# Patient Record
Sex: Female | Born: 1995 | Race: White | Hispanic: No | Marital: Single
Health system: Southern US, Community
[De-identification: ages and names within clinical notes are randomized; demographics above are authoritative.]

## PROBLEM LIST (undated history)

## (undated) DIAGNOSIS — J45909 Unspecified asthma, uncomplicated: Secondary | ICD-10-CM

## (undated) DIAGNOSIS — Q256 Stenosis of pulmonary artery: Secondary | ICD-10-CM

## (undated) DIAGNOSIS — F909 Attention-deficit hyperactivity disorder, unspecified type: Secondary | ICD-10-CM

## (undated) DIAGNOSIS — M62838 Other muscle spasm: Secondary | ICD-10-CM

## (undated) DIAGNOSIS — F32A Depression, unspecified: Secondary | ICD-10-CM

## (undated) DIAGNOSIS — G4730 Sleep apnea, unspecified: Secondary | ICD-10-CM

## (undated) DIAGNOSIS — T8859XA Other complications of anesthesia, initial encounter: Secondary | ICD-10-CM

## (undated) DIAGNOSIS — R011 Cardiac murmur, unspecified: Secondary | ICD-10-CM

## (undated) DIAGNOSIS — K219 Gastro-esophageal reflux disease without esophagitis: Secondary | ICD-10-CM

## (undated) DIAGNOSIS — L709 Acne, unspecified: Secondary | ICD-10-CM

## (undated) DIAGNOSIS — G44009 Cluster headache syndrome, unspecified, not intractable: Secondary | ICD-10-CM

## (undated) DIAGNOSIS — K828 Other specified diseases of gallbladder: Secondary | ICD-10-CM

## (undated) DIAGNOSIS — M1990 Unspecified osteoarthritis, unspecified site: Secondary | ICD-10-CM

## (undated) DIAGNOSIS — K30 Functional dyspepsia: Secondary | ICD-10-CM

## (undated) DIAGNOSIS — F419 Anxiety disorder, unspecified: Secondary | ICD-10-CM

## (undated) DIAGNOSIS — S0990XA Unspecified injury of head, initial encounter: Secondary | ICD-10-CM

## (undated) DIAGNOSIS — K5909 Other constipation: Secondary | ICD-10-CM

## (undated) DIAGNOSIS — T7840XA Allergy, unspecified, initial encounter: Secondary | ICD-10-CM

## (undated) DIAGNOSIS — F329 Major depressive disorder, single episode, unspecified: Secondary | ICD-10-CM

## (undated) DIAGNOSIS — T4145XA Adverse effect of unspecified anesthetic, initial encounter: Secondary | ICD-10-CM

## (undated) HISTORY — DX: Depression, unspecified: F32.A

## (undated) HISTORY — DX: Attention-deficit hyperactivity disorder, unspecified type: F90.9

## (undated) HISTORY — DX: Allergy, unspecified, initial encounter: T78.40XA

## (undated) HISTORY — DX: Unspecified osteoarthritis, unspecified site: M19.90

## (undated) HISTORY — DX: Anxiety disorder, unspecified: F41.9

## (undated) HISTORY — DX: Sleep apnea, unspecified: G47.30

## (undated) HISTORY — DX: Unspecified asthma, uncomplicated: J45.909

## (undated) HISTORY — DX: Major depressive disorder, single episode, unspecified: F32.9

---
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
## 2013-02-18 ENCOUNTER — Telehealth: Payer: Self-pay | Admitting: Diagnostic Neuroimaging

## 2013-02-18 NOTE — Telephone Encounter (Signed)
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 Mother would like the Dr. Marjory Lies to please call back.

## 2013-02-20 ENCOUNTER — Telehealth: Payer: Self-pay | Admitting: Diagnostic Neuroimaging

## 2013-02-21 NOTE — Telephone Encounter (Signed)
Spoke to patient's mother Clydie Braun. 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 Clydie Braun is unable to speak with patient due to mission, but doctor traveling with patient on called Clydie Braun requesting to increase patients amytriptyline 50 mg to 100mg  for 2 months because he thinks patient is depressed. Clydie Braun would like to speak with Dr. Marjory Lies in reference to the increase because she is afraid it will interfere with patients HA and tolerance level. Advised patient Dr. Marjory Lies not in office. Clydie Braun requesting advice asap. Advised would send to [facility]. Mother agreed.

## 2013-02-21 NOTE — Telephone Encounter (Signed)
Dr. Marjory Lies spoke to mother in relation to medication reccs.

## 2013-07-10 ENCOUNTER — Other Ambulatory Visit: Payer: Self-pay

## 2013-07-10 MED ORDER — RIZATRIPTAN BENZOATE 10 MG PO TABS: 10.0000 mg | ORAL_TABLET | ORAL | Status: DC | PRN

## 2013-07-10 MED ORDER — TIZANIDINE HCL 4 MG PO TABS: 4.0000 mg | ORAL_TABLET | Freq: Two times a day (BID) | ORAL | Status: DC | PRN

## 2013-07-11 ENCOUNTER — Telehealth: Payer: Self-pay | Admitting: Diagnostic Neuroimaging

## 2013-07-11 NOTE — Telephone Encounter (Signed)
Was given her meds in 90days refills for the year and pt is on a mission and now they are switching to [facility] so there will be a request sent for that.

## 2013-07-11 NOTE — Telephone Encounter (Signed)
Rx was already sent to mail order yesterday.  It usually takes them 1-3 business days to process the Rx after it has been sent.

## 2013-08-20 ENCOUNTER — Ambulatory Visit (INDEPENDENT_AMBULATORY_CARE_PROVIDER_SITE_OTHER): Admitting: Diagnostic Neuroimaging

## 2013-08-20 ENCOUNTER — Encounter (INDEPENDENT_AMBULATORY_CARE_PROVIDER_SITE_OTHER): Payer: Self-pay

## 2013-08-20 ENCOUNTER — Encounter: Payer: Self-pay | Admitting: Diagnostic Neuroimaging

## 2013-08-20 VITALS — BP 119/78 | HR 121

## 2013-08-20 DIAGNOSIS — G43809 Other migraine, not intractable, without status migrainosus: Secondary | ICD-10-CM

## 2013-08-20 DIAGNOSIS — M531 Cervicobrachial syndrome: Secondary | ICD-10-CM

## 2013-08-20 DIAGNOSIS — D239 Other benign neoplasm of skin, unspecified: Secondary | ICD-10-CM

## 2013-08-20 DIAGNOSIS — M5481 Occipital neuralgia: Secondary | ICD-10-CM

## 2013-08-20 HISTORY — DX: Other benign neoplasm of skin, unspecified: D23.9

## 2013-08-20 NOTE — Patient Instructions (Signed)
Try physical therapy.  Come back for occipital nerve block injections later this week.
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
## 2013-08-21 ENCOUNTER — Ambulatory Visit (INDEPENDENT_AMBULATORY_CARE_PROVIDER_SITE_OTHER): Admitting: Diagnostic Neuroimaging

## 2013-08-21 VITALS — BP 110/77 | HR 108

## 2013-08-21 DIAGNOSIS — M531 Cervicobrachial syndrome: Secondary | ICD-10-CM

## 2013-08-21 DIAGNOSIS — M5481 Occipital neuralgia: Secondary | ICD-10-CM

## 2013-08-21 NOTE — Progress Notes (Signed)
    Occipital nerve block procedure note  Risks and benefits were reviewed with patient and informed verbal consent obtained. Marcaine 0.5% (lot [id number], expiration 01/27/14) x 64ml plus Depo-Medrol 80 mg/ml (lot [id number], expiration 02/2014) x 17ml were mixed in sterile fashion. 2 mL mixture was injected over 6 total sites (3 on each side) in right and left greater and lesser occipital nerve regions in sterile fashion. Patient tolerated procedure. No complications.  Penni Bombard, MD 2/70/3500, 93:81 AM Certified in Neurology, Neurophysiology and Neuroimaging  [facility] [address], [place], [place] [address] [phone]

## 2013-08-22 ENCOUNTER — Telehealth: Payer: Self-pay | Admitting: Diagnostic Neuroimaging

## 2013-08-22 NOTE — Telephone Encounter (Signed)
Patient calling regarding itching and redness at the injection sites and wants to know if this is normal or not. Please call to advise.

## 2013-08-22 NOTE — Telephone Encounter (Signed)
I called patient's mother. Some redness and itchness at injection site. No fevers or chills. Will try cold compress and hydrocortisone topical.   Penni Bombard, MD 8/45/3646, 8:03 PM Certified in Neurology, Neurophysiology and [facility] [address], [place], [place] [address] [phone]

## 2013-09-28 ENCOUNTER — Encounter (HOSPITAL_COMMUNITY): Admitting: Anesthesiology

## 2013-09-28 ENCOUNTER — Emergency Department (HOSPITAL_COMMUNITY): Admitting: Anesthesiology

## 2013-09-28 ENCOUNTER — Emergency Department (HOSPITAL_COMMUNITY)

## 2013-09-28 ENCOUNTER — Encounter (HOSPITAL_COMMUNITY)
Admission: EM | Disposition: A | Discharge status: Home / Self Care | Payer: Self-pay | Source: Home / Self Care | Attending: Emergency Medicine

## 2013-09-28 ENCOUNTER — Encounter (HOSPITAL_COMMUNITY): Payer: Self-pay | Admitting: Emergency Medicine

## 2013-09-28 ENCOUNTER — Ambulatory Visit (HOSPITAL_COMMUNITY)
Admission: EM | Admit: 2013-09-28 | Discharge: 2013-09-29 | Disposition: A | Discharge status: Home / Self Care | Attending: General Surgery | Admitting: General Surgery

## 2013-09-28 DIAGNOSIS — L708 Other acne: Secondary | ICD-10-CM | POA: Insufficient documentation

## 2013-09-28 DIAGNOSIS — K3580 Unspecified acute appendicitis: Secondary | ICD-10-CM

## 2013-09-28 DIAGNOSIS — R109 Unspecified abdominal pain: Secondary | ICD-10-CM

## 2013-09-28 DIAGNOSIS — R933 Abnormal findings on diagnostic imaging of other parts of digestive tract: Secondary | ICD-10-CM

## 2013-09-28 DIAGNOSIS — R112 Nausea with vomiting, unspecified: Secondary | ICD-10-CM

## 2013-09-28 DIAGNOSIS — K219 Gastro-esophageal reflux disease without esophagitis: Secondary | ICD-10-CM | POA: Insufficient documentation

## 2013-09-28 DIAGNOSIS — G43809 Other migraine, not intractable, without status migrainosus: Secondary | ICD-10-CM | POA: Insufficient documentation

## 2013-09-28 DIAGNOSIS — Q2571 Coarctation of pulmonary artery: Secondary | ICD-10-CM

## 2013-09-28 DIAGNOSIS — K37 Unspecified appendicitis: Secondary | ICD-10-CM

## 2013-09-28 DIAGNOSIS — Q255 Atresia of pulmonary artery: Secondary | ICD-10-CM | POA: Insufficient documentation

## 2013-09-28 HISTORY — PX: LAPAROSCOPIC APPENDECTOMY: SHX408

## 2013-09-28 LAB — COMPREHENSIVE METABOLIC PANEL
ALT: 13 U/L (ref 0–35)
AST: 20 U/L (ref 0–37)
Albumin: 4 g/dL (ref 3.5–5.2)
Alkaline Phosphatase: 86 U/L (ref 39–117)
BILIRUBIN TOTAL: 0.5 mg/dL (ref 0.3–1.2)
BUN: 11 mg/dL (ref 6–23)
CHLORIDE: 103 meq/L (ref 96–112)
CO2: 22 meq/L (ref 19–32)
Calcium: 9.1 mg/dL (ref 8.4–10.5)
Creatinine, Ser: 0.74 mg/dL (ref 0.50–1.10)
GFR calc non Af Amer: >90 mL/min (ref >90)
Glucose, Bld: 90 mg/dL (ref 70–99)
Potassium: 4.7 mEq/L (ref 3.7–5.3)
Sodium: 139 mEq/L (ref 137–147)
Total Protein: 7.4 g/dL (ref 6.0–8.3)

## 2013-09-28 LAB — CBC WITH DIFFERENTIAL/PLATELET
Basophils Absolute: 0 10*3/uL (ref 0.0–0.1)
Basophils Relative: 0 % (ref 0–1)
Eosinophils Absolute: 0.2 10*3/uL (ref 0.0–0.7)
Eosinophils Relative: 3 % (ref 0–5)
HCT: 40.5 % (ref 36.0–46.0)
HEMOGLOBIN: 13.7 g/dL (ref 12.0–15.0)
LYMPHS ABS: 2.9 10*3/uL (ref 0.7–4.0)
LYMPHS PCT: 40 % (ref 12–46)
MCH: 29.7 pg (ref 26.0–34.0)
MCHC: 33.8 g/dL (ref 30.0–36.0)
MCV: 87.7 fL (ref 78.0–100.0)
MONOS PCT: 8 % (ref 3–12)
Monocytes Absolute: 0.6 10*3/uL (ref 0.1–1.0)
Neutro Abs: 3.4 10*3/uL (ref 1.7–7.7)
Neutrophils Relative %: 49 % (ref 43–77)
Platelets: 263 10*3/uL (ref 150–400)
RBC: 4.62 MIL/uL (ref 3.87–5.11)
RDW: 13.2 % (ref 11.5–15.5)
WBC: 7.1 10*3/uL (ref 4.0–10.5)

## 2013-09-28 LAB — URINALYSIS, ROUTINE W REFLEX MICROSCOPIC
BILIRUBIN URINE: NEGATIVE
Glucose, UA: NEGATIVE mg/dL
Hgb urine dipstick: NEGATIVE
KETONES UR: NEGATIVE mg/dL
LEUKOCYTES UA: NEGATIVE
NITRITE: NEGATIVE
PROTEIN: NEGATIVE mg/dL
Specific Gravity, Urine: 1.008 (ref 1.005–1.030)
Urobilinogen, UA: 0.2 mg/dL (ref 0.0–1.0)
pH: 6.5 (ref 5.0–8.0)

## 2013-09-28 LAB — WET PREP, GENITAL
Clue Cells Wet Prep HPF POC: NONE SEEN
Trich, Wet Prep: NONE SEEN
Yeast Wet Prep HPF POC: NONE SEEN

## 2013-09-28 LAB — POC URINE PREG, ED: Preg Test, Ur: NEGATIVE

## 2013-09-28 LAB — LIPASE, BLOOD: Lipase: 33 U/L (ref 11–59)

## 2013-09-28 SURGERY — APPENDECTOMY, LAPAROSCOPIC
Anesthesia: General | Site: Abdomen

## 2013-09-28 MED ORDER — IOHEXOL 300 MG/ML  SOLN
25.0000 mL | Freq: Once | INTRAMUSCULAR | Status: AC | PRN
  Administered 2013-09-28: 25 mL via ORAL

## 2013-09-28 MED ORDER — DEXAMETHASONE SODIUM PHOSPHATE 4 MG/ML IJ SOLN
INTRAMUSCULAR | Status: AC
  Filled 2013-09-28: qty 1

## 2013-09-28 MED ORDER — PHENYLEPHRINE 40 MCG/ML (10ML) SYRINGE FOR IV PUSH (FOR BLOOD PRESSURE SUPPORT)
PREFILLED_SYRINGE | INTRAVENOUS | Status: AC
  Filled 2013-09-28: qty 10

## 2013-09-28 MED ORDER — MORPHINE SULFATE 4 MG/ML IJ SOLN
4.0000 mg | Freq: Once | INTRAMUSCULAR | Status: AC
  Administered 2013-09-28: 4 mg via INTRAVENOUS
  Filled 2013-09-28: qty 1

## 2013-09-28 MED ORDER — SUCCINYLCHOLINE CHLORIDE 20 MG/ML IJ SOLN
INTRAMUSCULAR | Status: AC
  Filled 2013-09-28: qty 1

## 2013-09-28 MED ORDER — KETOROLAC TROMETHAMINE 30 MG/ML IJ SOLN
30.0000 mg | Freq: Four times a day (QID) | INTRAMUSCULAR | Status: DC
  Administered 2013-09-28 – 2013-09-29 (×2): 30 mg via INTRAVENOUS
  Filled 2013-09-28 (×6): qty 1

## 2013-09-28 MED ORDER — ONDANSETRON HCL 4 MG/2ML IJ SOLN: 4.0000 mg | Freq: Once | INTRAMUSCULAR | Status: DC | PRN

## 2013-09-28 MED ORDER — GLYCOPYRROLATE 0.2 MG/ML IJ SOLN
INTRAMUSCULAR | Status: DC | PRN
  Administered 2013-09-28: 0.4 mg via INTRAVENOUS

## 2013-09-28 MED ORDER — ONDANSETRON HCL 4 MG/2ML IJ SOLN
INTRAMUSCULAR | Status: DC | PRN
  Administered 2013-09-28: 4 mg via INTRAVENOUS

## 2013-09-28 MED ORDER — DIPHENHYDRAMINE HCL 25 MG PO CAPS
25.0000 mg | ORAL_CAPSULE | Freq: Four times a day (QID) | ORAL | Status: DC | PRN
  Administered 2013-09-29 (×3): 25 mg via ORAL
  Filled 2013-09-28 (×3): qty 1

## 2013-09-28 MED ORDER — HYDROMORPHONE HCL PF 1 MG/ML IJ SOLN
0.2500 mg | INTRAMUSCULAR | Status: DC | PRN
  Administered 2013-09-28 (×4): 0.5 mg via INTRAVENOUS

## 2013-09-28 MED ORDER — SODIUM CHLORIDE 0.9 % IV BOLUS (SEPSIS)
1000.0000 mL | Freq: Once | INTRAVENOUS | Status: AC
  Administered 2013-09-28: 1000 mL via INTRAVENOUS

## 2013-09-28 MED ORDER — NEOSTIGMINE METHYLSULFATE 10 MG/10ML IV SOLN
INTRAVENOUS | Status: DC | PRN
  Administered 2013-09-28: 3 mg via INTRAVENOUS

## 2013-09-28 MED ORDER — MIDAZOLAM HCL 2 MG/2ML IJ SOLN
2.0000 mg | Freq: Once | INTRAMUSCULAR | Status: AC | PRN
  Administered 2013-09-28: 2 mg via INTRAVENOUS

## 2013-09-28 MED ORDER — KCL IN DEXTROSE-NACL 10-5-0.45 MEQ/L-%-% IV SOLN
INTRAVENOUS | Status: DC
  Administered 2013-09-28: 23:00:00 via INTRAVENOUS
  Filled 2013-09-28 (×3): qty 1000

## 2013-09-28 MED ORDER — ONDANSETRON HCL 4 MG PO TABS: 4.0000 mg | ORAL_TABLET | Freq: Four times a day (QID) | ORAL | Status: DC | PRN

## 2013-09-28 MED ORDER — SUCCINYLCHOLINE CHLORIDE 20 MG/ML IJ SOLN
INTRAMUSCULAR | Status: DC | PRN
  Administered 2013-09-28: 100 mg via INTRAVENOUS

## 2013-09-28 MED ORDER — PROPOFOL 10 MG/ML IV BOLUS
INTRAVENOUS | Status: DC | PRN
  Administered 2013-09-28: 180 mg via INTRAVENOUS

## 2013-09-28 MED ORDER — TIZANIDINE HCL 4 MG PO TABS
4.0000 mg | ORAL_TABLET | Freq: Two times a day (BID) | ORAL | Status: DC | PRN
  Filled 2013-09-28: qty 1

## 2013-09-28 MED ORDER — OXYCODONE-ACETAMINOPHEN 5-325 MG PO TABS
1.0000 | ORAL_TABLET | ORAL | Status: DC | PRN
  Administered 2013-09-29 (×3): 2 via ORAL
  Filled 2013-09-28 (×3): qty 2

## 2013-09-28 MED ORDER — FENTANYL CITRATE 0.05 MG/ML IJ SOLN
INTRAMUSCULAR | Status: AC
  Filled 2013-09-28: qty 5

## 2013-09-28 MED ORDER — MIDAZOLAM HCL 5 MG/5ML IJ SOLN
INTRAMUSCULAR | Status: DC | PRN
  Administered 2013-09-28: 2 mg via INTRAVENOUS

## 2013-09-28 MED ORDER — HYDROMORPHONE HCL PF 1 MG/ML IJ SOLN
INTRAMUSCULAR | Status: AC
  Filled 2013-09-28: qty 1

## 2013-09-28 MED ORDER — SODIUM CHLORIDE 0.9 % IR SOLN
Status: DC | PRN
  Administered 2013-09-28: 1000 mL

## 2013-09-28 MED ORDER — LACTATED RINGERS IV SOLN
INTRAVENOUS | Status: DC | PRN
  Administered 2013-09-28 (×2): via INTRAVENOUS

## 2013-09-28 MED ORDER — ONDANSETRON HCL 4 MG/2ML IJ SOLN: 4.0000 mg | Freq: Four times a day (QID) | INTRAMUSCULAR | Status: DC | PRN

## 2013-09-28 MED ORDER — LIDOCAINE HCL (CARDIAC) 20 MG/ML IV SOLN
INTRAVENOUS | Status: DC | PRN
  Administered 2013-09-28: 50 mg via INTRAVENOUS

## 2013-09-28 MED ORDER — VECURONIUM BROMIDE 10 MG IV SOLR
INTRAVENOUS | Status: AC
  Filled 2013-09-28: qty 10

## 2013-09-28 MED ORDER — DEXAMETHASONE SODIUM PHOSPHATE 4 MG/ML IJ SOLN
INTRAMUSCULAR | Status: DC | PRN
Start: 2013-09-28 — End: 2013-09-28
  Administered 2013-09-28: 4 mg via INTRAVENOUS

## 2013-09-28 MED ORDER — ARTIFICIAL TEARS OP OINT
TOPICAL_OINTMENT | OPHTHALMIC | Status: DC | PRN
  Administered 2013-09-28: 1 via OPHTHALMIC

## 2013-09-28 MED ORDER — SODIUM CHLORIDE 0.9 % IV SOLN
1.0000 g | INTRAVENOUS | Status: AC
  Administered 2013-09-28: 1 g via INTRAVENOUS
  Filled 2013-09-28: qty 1

## 2013-09-28 MED ORDER — MIDAZOLAM HCL 2 MG/2ML IJ SOLN
INTRAMUSCULAR | Status: AC
  Filled 2013-09-28: qty 2

## 2013-09-28 MED ORDER — MELOXICAM 15 MG PO TABS
15.0000 mg | ORAL_TABLET | Freq: Every day | ORAL | Status: DC | PRN
  Filled 2013-09-28: qty 1

## 2013-09-28 MED ORDER — HYDROMORPHONE HCL PF 1 MG/ML IJ SOLN
0.5000 mg | INTRAMUSCULAR | Status: DC | PRN
  Administered 2013-09-29 (×2): 1 mg via INTRAVENOUS
  Filled 2013-09-28 (×2): qty 1

## 2013-09-28 MED ORDER — ONDANSETRON HCL 4 MG/2ML IJ SOLN
4.0000 mg | Freq: Once | INTRAMUSCULAR | Status: AC
  Administered 2013-09-28: 4 mg via INTRAVENOUS
  Filled 2013-09-28: qty 2

## 2013-09-28 MED ORDER — STERILE WATER FOR INJECTION IJ SOLN
INTRAMUSCULAR | Status: AC
  Filled 2013-09-28: qty 10

## 2013-09-28 MED ORDER — FENTANYL CITRATE 0.05 MG/ML IJ SOLN
INTRAMUSCULAR | Status: DC | PRN
  Administered 2013-09-28: 25 ug via INTRAVENOUS
  Administered 2013-09-28: 50 ug via INTRAVENOUS
  Administered 2013-09-28: 25 ug via INTRAVENOUS
  Administered 2013-09-28: 100 ug via INTRAVENOUS
  Administered 2013-09-28: 50 ug via INTRAVENOUS

## 2013-09-28 MED ORDER — KETOROLAC TROMETHAMINE 30 MG/ML IJ SOLN
INTRAMUSCULAR | Status: AC
  Filled 2013-09-28: qty 1

## 2013-09-28 MED ORDER — BUPIVACAINE-EPINEPHRINE (PF) 0.25% -1:200000 IJ SOLN
INTRAMUSCULAR | Status: AC
  Filled 2013-09-28: qty 30

## 2013-09-28 MED ORDER — VECURONIUM BROMIDE 10 MG IV SOLR
INTRAVENOUS | Status: DC | PRN
  Administered 2013-09-28: 4 mg via INTRAVENOUS

## 2013-09-28 MED ORDER — ARTIFICIAL TEARS OP OINT
TOPICAL_OINTMENT | OPHTHALMIC | Status: AC
  Filled 2013-09-28: qty 3.5

## 2013-09-28 MED ORDER — PROPOFOL 10 MG/ML IV BOLUS
INTRAVENOUS | Status: AC
  Filled 2013-09-28: qty 20

## 2013-09-28 MED ORDER — IOHEXOL 300 MG/ML  SOLN
80.0000 mL | Freq: Once | INTRAMUSCULAR | Status: AC | PRN
  Administered 2013-09-28: 80 mL via INTRAVENOUS

## 2013-09-28 MED ORDER — ENOXAPARIN SODIUM 40 MG/0.4ML ~~LOC~~ SOLN
40.0000 mg | SUBCUTANEOUS | Status: DC
  Filled 2013-09-28: qty 0.4

## 2013-09-28 MED ORDER — GLYCOPYRROLATE 0.2 MG/ML IJ SOLN
INTRAMUSCULAR | Status: AC
  Filled 2013-09-28: qty 2

## 2013-09-28 MED ORDER — AMITRIPTYLINE HCL 50 MG PO TABS
50.0000 mg | ORAL_TABLET | Freq: Every day | ORAL | Status: DC
  Administered 2013-09-28: 50 mg via ORAL
  Filled 2013-09-28 (×2): qty 1

## 2013-09-28 MED ORDER — BUPIVACAINE-EPINEPHRINE 0.25% -1:200000 IJ SOLN
INTRAMUSCULAR | Status: DC | PRN
  Administered 2013-09-28: 19 mL

## 2013-09-28 MED ORDER — LIDOCAINE HCL (CARDIAC) 20 MG/ML IV SOLN
INTRAVENOUS | Status: AC
  Filled 2013-09-28: qty 5

## 2013-09-28 MED ORDER — ONDANSETRON HCL 4 MG/2ML IJ SOLN
INTRAMUSCULAR | Status: AC
  Filled 2013-09-28: qty 2

## 2013-09-28 MED ORDER — NEOSTIGMINE METHYLSULFATE 10 MG/10ML IV SOLN
INTRAVENOUS | Status: AC
  Filled 2013-09-28: qty 1

## 2013-09-28 SURGICAL SUPPLY — 43 items
APPLIER CLIP ROT 10 11.4 M/L (STAPLE)
BLADE SURG ROTATE 9660 (MISCELLANEOUS) IMPLANT
CANISTER SUCTION 2500CC (MISCELLANEOUS) ×2 IMPLANT
CHLORAPREP W/TINT 26ML (MISCELLANEOUS) ×2 IMPLANT
CLIP APPLIE ROT 10 11.4 M/L (STAPLE) IMPLANT
COVER SURGICAL LIGHT HANDLE (MISCELLANEOUS) ×2 IMPLANT
CUTTER LINEAR ENDO 35 ETS (STAPLE) ×2 IMPLANT
CUTTER LINEAR ENDO 35 ETS TH (STAPLE) IMPLANT
DECANTER SPIKE VIAL GLASS SM (MISCELLANEOUS) ×2 IMPLANT
DERMABOND ADVANCED (GAUZE/BANDAGES/DRESSINGS) ×1
DERMABOND ADVANCED .7 DNX12 (GAUZE/BANDAGES/DRESSINGS) ×1 IMPLANT
DRAPE UTILITY 15X26 W/TAPE STR (DRAPE) ×4 IMPLANT
DRSG TEGADERM 4X4.75 (GAUZE/BANDAGES/DRESSINGS) ×2 IMPLANT
ELECT REM PT RETURN 9FT ADLT (ELECTROSURGICAL) ×2
ELECTRODE REM PT RTRN 9FT ADLT (ELECTROSURGICAL) ×1 IMPLANT
ENDOLOOP SUT PDS II  0 18 (SUTURE)
ENDOLOOP SUT PDS II 0 18 (SUTURE) IMPLANT
GLOVE BIOGEL PI IND STRL 8 (GLOVE) ×1 IMPLANT
GLOVE BIOGEL PI INDICATOR 8 (GLOVE) ×1
GLOVE ECLIPSE 7.5 STRL STRAW (GLOVE) ×2 IMPLANT
GOWN STRL REUS W/ TWL LRG LVL3 (GOWN DISPOSABLE) ×2 IMPLANT
GOWN STRL REUS W/TWL LRG LVL3 (GOWN DISPOSABLE) ×2
KIT BASIN OR (CUSTOM PROCEDURE TRAY) ×2 IMPLANT
KIT ROOM TURNOVER OR (KITS) ×2 IMPLANT
NS IRRIG 1000ML POUR BTL (IV SOLUTION) ×2 IMPLANT
PAD ARMBOARD 7.5X6 YLW CONV (MISCELLANEOUS) ×4 IMPLANT
PENCIL BUTTON HOLSTER BLD 10FT (ELECTRODE) ×2 IMPLANT
POUCH SPECIMEN RETRIEVAL 10MM (ENDOMECHANICALS) ×2 IMPLANT
RELOAD /EVU35 (ENDOMECHANICALS) IMPLANT
RELOAD CUTTER ETS 35MM STAND (ENDOMECHANICALS) IMPLANT
SCALPEL HARMONIC ACE (MISCELLANEOUS) ×2 IMPLANT
SET IRRIG TUBING LAPAROSCOPIC (IRRIGATION / IRRIGATOR) ×2 IMPLANT
SPECIMEN JAR SMALL (MISCELLANEOUS) ×2 IMPLANT
STRIP CLOSURE SKIN 1/2X4 (GAUZE/BANDAGES/DRESSINGS) ×2 IMPLANT
SUT MNCRL AB 4-0 PS2 18 (SUTURE) ×2 IMPLANT
TOWEL OR 17X24 6PK STRL BLUE (TOWEL DISPOSABLE) ×2 IMPLANT
TOWEL OR 17X26 10 PK STRL BLUE (TOWEL DISPOSABLE) ×2 IMPLANT
TRAY FOLEY CATH 16FR SILVER (SET/KITS/TRAYS/PACK) ×2 IMPLANT
TRAY LAPAROSCOPIC (CUSTOM PROCEDURE TRAY) ×2 IMPLANT
TROCAR XCEL 12X100 BLDLESS (ENDOMECHANICALS) ×2 IMPLANT
TROCAR XCEL BLUNT TIP 100MML (ENDOMECHANICALS) ×2 IMPLANT
TROCAR XCEL NON-BLD 5MMX100MML (ENDOMECHANICALS) ×2 IMPLANT
WATER STERILE IRR 1000ML POUR (IV SOLUTION) IMPLANT

## 2013-09-28 NOTE — ED Notes (Signed)
Patient returned from CT

## 2013-09-28 NOTE — ED Provider Notes (Signed)
18 y.o. Female with abdominal pain began last night.  Describes as upper abdomen and sharp in nature.  She has not eaten today and has one episode of vomiting. PE Abdomen soft ttp ruq and rlq.  CT- equivocal for appendicitis.  Discussed with Dr. Hulen Skains and he has seen and evaluated patient.   Shaune Pollack, MD 09/28/13 2022

## 2013-09-28 NOTE — ED Notes (Signed)
Pelvic cart set up in room 

## 2013-09-28 NOTE — ED Notes (Signed)
Transported to Short Stay at this time

## 2013-09-28 NOTE — ED Notes (Addendum)
Shes had abd pain and nausea since yesterday. She was seen by GYN for a similar pain several weeks ago but they did not find anything wrong

## 2013-09-28 NOTE — H&P (Signed)
Angela [name] is an 18 y.o. female.   Chief Complaint: Abdominal pain HPI: Patient started getting sick yesterday evening with right sided abdominal pain associated with anxiety, nausea and vomiting.  Poor appetite.  CT in the ED suggests possible early acute appendicitis.  Clinical exam is equivocal.  The patient is due to go on a 6 month mission in a few weeks.  Past Medical History  Diagnosis Date  . Complication of anesthesia 05/2010    ketamine reactiion - combative, hallucinations  . Headache(784.0)   . Pulmonary artery stenosis   . Acne     Past Surgical History  Procedure Laterality Date  . Hymenectomy  2006  . Tonsillectomy  1999    and adenoids  . Cosmetic surgery  2009    septoplasty    Family History  Problem Relation Age of Onset  . Colon cancer Maternal Grandmother   . Other Maternal Grandfather     pituitary tumor  . Diabetes Paternal Grandmother   . Heart Problems Paternal Grandfather     A fib, CHF   Social History:  reports that she has never smoked. She has never used smokeless tobacco. She reports that she does not drink alcohol or use illicit drugs.  Allergies:  Allergies  Allergen Reactions  . Augmentin [Amoxicillin-Pot Clavulanate] Nausea And Vomiting    Severe vomiting x 6-8 hours and has gotten to the point of throwing up blood  . Compazine [Prochlorperazine Edisylate]     Restless, agitation  . Ketamine Other (See Comments)    Pt had hallucinations and became very violent in an oral surgery  . Topamax [Topiramate] Hives  . Latex Rash    Specifically latex band-aids     (Not in a hospital admission)  Results for orders placed during the hospital encounter of 09/28/13 (from the past 48 hour(s))  CBC WITH DIFFERENTIAL     Status: None   Collection Time    09/28/13 11:34 AM      Result Value Ref Range   WBC 7.1  4.0 - 10.5 K/uL   RBC 4.62  3.87 - 5.11 MIL/uL   Hemoglobin 13.7  12.0 - 15.0 g/dL   HCT 40.5  36.0 - 46.0 %   MCV 87.7  78.0  - 100.0 fL   MCH 29.7  26.0 - 34.0 pg   MCHC 33.8  30.0 - 36.0 g/dL   RDW 13.2  11.5 - 15.5 %   Platelets 263  150 - 400 K/uL   Neutrophils Relative % 49  43 - 77 %   Neutro Abs 3.4  1.7 - 7.7 K/uL   Lymphocytes Relative 40  12 - 46 %   Lymphs Abs 2.9  0.7 - 4.0 K/uL   Monocytes Relative 8  3 - 12 %   Monocytes Absolute 0.6  0.1 - 1.0 K/uL   Eosinophils Relative 3  0 - 5 %   Eosinophils Absolute 0.2  0.0 - 0.7 K/uL   Basophils Relative 0  0 - 1 %   Basophils Absolute 0.0  0.0 - 0.1 K/uL  COMPREHENSIVE METABOLIC PANEL     Status: None   Collection Time    09/28/13 11:34 AM      Result Value Ref Range   Sodium 139  137 - 147 mEq/L   Potassium 4.7  3.7 - 5.3 mEq/L   Chloride 103  96 - 112 mEq/L   CO2 22  19 - 32 mEq/L   Glucose, Bld 90  70 - 99 mg/dL   BUN 11  6 - 23 mg/dL   Creatinine, Ser 0.74  0.50 - 1.10 mg/dL   Calcium 9.1  8.4 - 10.5 mg/dL   Total Protein 7.4  6.0 - 8.3 g/dL   Albumin 4.0  3.5 - 5.2 g/dL   AST 20  0 - 37 U/L   ALT 13  0 - 35 U/L   Alkaline Phosphatase 86  39 - 117 U/L   Total Bilirubin 0.5  0.3 - 1.2 mg/dL   GFR calc non Af Amer >90  >90 mL/min   GFR calc Af Amer >90  >90 mL/min   Comment: (NOTE)     The eGFR has been calculated using the CKD EPI equation.     This calculation has not been validated in all clinical situations.     eGFR's persistently <90 mL/min signify possible Chronic Kidney     Disease.  LIPASE, BLOOD     Status: None   Collection Time    09/28/13 11:56 AM      Result Value Ref Range   Lipase 33  11 - 59 U/L  URINALYSIS, ROUTINE W REFLEX MICROSCOPIC     Status: None   Collection Time    09/28/13 12:03 PM      Result Value Ref Range   Color, Urine YELLOW  YELLOW   APPearance CLEAR  CLEAR   Specific Gravity, Urine 1.008  1.005 - 1.030   pH 6.5  5.0 - 8.0   Glucose, UA NEGATIVE  NEGATIVE mg/dL   Hgb urine dipstick NEGATIVE  NEGATIVE   Bilirubin Urine NEGATIVE  NEGATIVE   Ketones, ur NEGATIVE  NEGATIVE mg/dL   Protein, ur  NEGATIVE  NEGATIVE mg/dL   Urobilinogen, UA 0.2  0.0 - 1.0 mg/dL   Nitrite NEGATIVE  NEGATIVE   Leukocytes, UA NEGATIVE  NEGATIVE   Comment: MICROSCOPIC NOT DONE ON URINES WITH NEGATIVE PROTEIN, BLOOD, LEUKOCYTES, NITRITE, OR GLUCOSE <1000 mg/dL.  POC URINE PREG, ED     Status: None   Collection Time    09/28/13 12:09 PM      Result Value Ref Range   Preg Test, Ur NEGATIVE  NEGATIVE   Comment:            THE SENSITIVITY OF THIS     METHODOLOGY IS >24 mIU/mL  WET PREP, GENITAL     Status: Abnormal   Collection Time    09/28/13  1:16 PM      Result Value Ref Range   Yeast Wet Prep HPF POC NONE SEEN  NONE SEEN   Trich, Wet Prep NONE SEEN  NONE SEEN   Clue Cells Wet Prep HPF POC NONE SEEN  NONE SEEN   WBC, Wet Prep HPF POC TOO NUMEROUS TO COUNT (*) NONE SEEN   Ct Abdomen Pelvis W Contrast  09/28/2013   CLINICAL DATA:  Right lower quadrant pain.  Nausea and vomiting.  EXAM: CT ABDOMEN AND PELVIS WITH CONTRAST  TECHNIQUE: Multidetector CT imaging of the abdomen and pelvis was performed using the standard protocol following bolus administration of intravenous contrast.  CONTRAST:  66m OMNIPAQUE IOHEXOL 300 MG/ML  SOLN  COMPARISON:  None.  FINDINGS: The appendix curls all will need posterior tip of the cecum. It measures 6 mm in greatest diameter. It contains air. There is no appendiceal wall thickening. Appears, however, mild adjacent hazy inflammatory change.  And moderate increased stool in the colon. No colonic wall thickening are other areas of  inflammation. The small bowel is unremarkable.  The uterus and is unremarkable. Left ovary is mildly prominent, likely from a dominant follicular cyst.  Clear lung bases.  The heart is normal size.  Normal liver, spleen, gallbladder and pancreas. No bile duct dilation. Normal adrenal glands. Normal kidneys. Normal ureters. Normal bladder. No pathologically enlarged lymph nodes are appreciated.  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 No other bony abnormality.  IMPRESSION: 1. Appendix is at the upper limits of normal in diameter. There is no wall thickening and air is seen in the appendix. However, there is periappendiceal inflammatory type change. The findings are equivocal. They could reflect early appendicitis. The apparent inflammatory change may alternatively reflect a small amount of inferior pericolic gutter nonspecific fluid. Consider a short-term followup CT to re-evaluate the appendix, with repeat CT imaging in 12 the 24 hours. 2. Moderate increased stool in the colon. 3. 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 4. No other abnormalities.   Electronically Signed   By: Lajean Manes M.D.   On: 09/28/2013 16:28    Review of Systems  Constitutional: Negative for fever and chills.  HENT: Negative.   Eyes: Negative.   Respiratory: Negative.   Cardiovascular: Negative.   Gastrointestinal: Positive for nausea, vomiting and abdominal pain.       Poor appetite  Genitourinary: Negative.   Musculoskeletal: Negative.   Skin: Negative.   Neurological: Negative.   Endo/Heme/Allergies: Negative.   Psychiatric/Behavioral: Negative.     Blood pressure 114/67, pulse 80, temperature 98.4 F (36.9 C), temperature source Oral, resp. rate 18, SpO2 99.00%. Physical Exam  Constitutional: She is oriented to person, place, and time. She appears well-developed and well-nourished.  HENT:  Head: Normocephalic and atraumatic.  Eyes: Conjunctivae and EOM are normal. Pupils are equal, round, and reactive to light.  Neck: Normal range of motion. Neck supple.  Cardiovascular: Normal rate, regular rhythm and normal heart sounds.   Respiratory: Effort normal and breath sounds normal.  GI: Soft. Normal appearance and bowel sounds are normal. She exhibits no distension. There is tenderness in the right lower quadrant. There is no rigidity and no guarding.    Musculoskeletal: Normal range of motion.       Feet:  Neurological: She is  alert and oriented to person, place, and time. She has normal reflexes.  Skin: Skin is warm and dry.  Psychiatric: She has a normal mood and affect. Her behavior is normal. Judgment and thought content normal.     Assessment/Plan Abdominal pain Possible early acute appendicitis, but equivocal because  There is air in the appendix, and minimal stranding   I have offered the patient lap appy with 60/40 chance this could be acute appendicitis.  They have decided to go ahead with surgery.  Gwenyth Ober 09/28/2013, 5:51 PM

## 2013-09-28 NOTE — ED Provider Notes (Signed)
CSN: [id number]     Arrival date & time 09/28/13  1128 History   First MD Initiated Contact with Patient 09/28/13 1156     Chief Complaint  Patient presents with  . Abdominal Pain     (Consider location/radiation/quality/duration/timing/severity/associated sxs/prior Treatment) HPI  18 year old female with history of migraine, pulmonary artery stenosis, acne who presents complaining of abdominal pain. Patient report yesterday afternoon while sitting is low an acute onset of sharp stabbing and hurting pain to the low abdomen. Pain wraps around her abdomen and back, persistent, 8/10, worsening with movement, and associate nausea. She tries taking, with minimal relief. She is afraid to eat and drink. No associated fever, chills, headache, chest pain, shortness of breath, productive cough, dysuria, hematuria, hematochezia or melena. No rash. Patient has one similar episode 3 weeks ago but not as intense. She was seen at her OB/GYN had an ultrasound performed that shows no acute finding. She reported not being sexually active. Her last menstrual period was 2 weeks ago.  Past Medical History  Diagnosis Date  . Complication of anesthesia 05/2010    ketamine reactiion - combative, hallucinations  . Headache(784.0)   . Pulmonary artery stenosis   . Acne    Past Surgical History  Procedure Laterality Date  . Hymenectomy  2006  . Tonsillectomy  1999    and adenoids  . Cosmetic surgery  2009    septoplasty   Family History  Problem Relation Age of Onset  . Colon cancer Maternal Grandmother   . Other Maternal Grandfather     pituitary tumor  . Diabetes Paternal Grandmother   . Heart Problems Paternal Grandfather     A fib, CHF   History  Substance Use Topics  . Smoking status: Never Smoker   . Smokeless tobacco: Never Used  . Alcohol Use: No   OB History   Grav Para Term Preterm Abortions TAB SAB Ect Mult Living                 Review of Systems  All other systems reviewed and are  negative.     Allergies  Augmentin; Compazine; Ketamine; and Topamax  Home Medications   Prior to Admission medications   Medication Sig Start Date End Date Taking? Authorizing Provider  amitriptyline (ELAVIL) 50 MG tablet Take 100 mg by mouth at bedtime. 11/01/12   Penni Bombard, MD  cetirizine (ZYRTEC) 10 MG tablet Take 10 mg by mouth at bedtime.     Historical Provider, MD  ibuprofen (ADVIL,MOTRIN) 200 MG tablet Take 400 mg by mouth every 6 (six) hours as needed for pain.    Historical Provider, MD  metoprolol tartrate (LOPRESSOR) 25 MG tablet Take 25 mg by mouth. Titrating; currently at 6mg     Historical Provider, MD  rizatriptan (MAXALT) 10 MG tablet Take 1 tablet (10 mg total) by mouth as needed for migraine. May repeat in 2 hours if needed 07/10/13   Penni Bombard, MD  tiZANidine (ZANAFLEX) 4 MG tablet Take 1 tablet (4 mg total) by mouth 2 (two) times daily as needed. 07/10/13   Penni Bombard, MD   BP 109/72  Pulse 104  Temp(Src) 98.4 F (36.9 C) (Oral)  Resp 18  SpO2 99% Physical Exam  Nursing note and vitals reviewed. Constitutional: She appears well-developed and well-nourished. No distress.  HENT:  Head: Normocephalic and atraumatic.  Eyes: Conjunctivae are normal.  Neck: Normal range of motion. Neck supple.  Cardiovascular: Normal rate and regular rhythm.  Pulmonary/Chest: Effort normal and breath sounds normal. She exhibits no tenderness.  Abdominal: Soft. Bowel sounds are normal. She exhibits no distension and no mass. There is tenderness (Diffuse abdominal tenderness without focal point tenderness. No Murphy sign, no McBurney's point.). There is no rebound and no guarding.  Genitourinary: Vagina normal and uterus normal. There is no rash or lesion on the right labia. There is no rash or lesion on the left labia. Cervix exhibits no motion tenderness and no discharge. Right adnexum displays no mass and no tenderness. Left adnexum displays no mass and no  tenderness. No erythema, tenderness or bleeding around the vagina. No vaginal discharge found.  Chaperone present:  Lymphadenopathy:       Right: No inguinal adenopathy present.       Left: No inguinal adenopathy present.    ED Course  Procedures (including critical care time)  12:32 PM Patient with low abnormal pain, appears uncomfortable but without peritoneal sign. No significant discomfort on pelvic examination. Her pregnancy test is negative, normal WBC and normal H&H, with normal comprehensive metabolic panel. However patient and her mom is concern of abdominal pain. Given that early appendicitis can manifest without significant change in ones labs value, will obtain abd/pelvis CT scan for further evaluation.  Pt and mother agrees with plan.    6:11 PM Abdominal and pelvis CT scan demonstrated an appendix that is at the upper limit of normal in diameter. There is also moderate increased stool burden. Given this finding, it is suspected this could be early appendicitis. With consult and general surgery, Dr. Hulen Skains who has seen and evaluate patient and plan on admitting the patient for observation vs. elective surgery for appendectomy.  Labs Review Labs Reviewed  WET PREP, GENITAL - Abnormal; Notable for the following:    WBC, Wet Prep HPF POC TOO NUMEROUS TO COUNT (*)    All other components within normal limits  GC/CHLAMYDIA PROBE AMP  CBC WITH DIFFERENTIAL  COMPREHENSIVE METABOLIC PANEL  URINALYSIS, ROUTINE W REFLEX MICROSCOPIC  LIPASE, BLOOD  POC URINE PREG, ED    Imaging Review Ct Abdomen Pelvis W Contrast  09/28/2013   CLINICAL DATA:  Right lower quadrant pain.  Nausea and vomiting.  EXAM: CT ABDOMEN AND PELVIS WITH CONTRAST  TECHNIQUE: Multidetector CT imaging of the abdomen and pelvis was performed using the standard protocol following bolus administration of intravenous contrast.  CONTRAST:  25mL OMNIPAQUE IOHEXOL 300 MG/ML  SOLN  COMPARISON:  None.  FINDINGS: The appendix  curls all will need posterior tip of the cecum. It measures 6 mm in greatest diameter. It contains air. There is no appendiceal wall thickening. Appears, however, mild adjacent hazy inflammatory change.  And moderate increased stool in the colon. No colonic wall thickening are other areas of inflammation. The small bowel is unremarkable.  The uterus and is unremarkable. Left ovary is mildly prominent, likely from a dominant follicular cyst.  Clear lung bases.  The heart is normal size.  Normal liver, spleen, gallbladder and pancreas. No bile duct dilation. Normal adrenal glands. Normal kidneys. Normal ureters. Normal bladder. No pathologically enlarged lymph nodes are appreciated.  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 No other bony abnormality.  IMPRESSION: 1. Appendix is at the upper limits of normal in diameter. There is no wall thickening and air is seen in the appendix. However, there is periappendiceal inflammatory type change. The findings are equivocal. They could reflect early appendicitis. The apparent inflammatory change may alternatively reflect  a small amount of inferior pericolic gutter nonspecific fluid. Consider a short-term followup CT to re-evaluate the appendix, with repeat CT imaging in 12 the 24 hours. 2. Moderate increased stool in the colon. 3. 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 4. No other abnormalities.   Electronically Signed   By: Lajean Manes M.D.   On: 09/28/2013 16:28     EKG Interpretation None      MDM   Final diagnoses:  Appendicitis    BP 114/67  Pulse 80  Temp(Src) 98.4 F (36.9 C) (Oral)  Resp 18  SpO2 99%  I have reviewed nursing notes and vital signs. I personally reviewed the imaging tests through PACS system  I reviewed available ER/hospitalization records thought the EMR     Domenic Moras, [place] 09/28/13 1814

## 2013-09-28 NOTE — Transfer of Care (Signed)
Immediate Anesthesia Transfer of Care Note  Patient: Angela [name]  Procedure(s) Performed: Procedure(s): APPENDECTOMY LAPAROSCOPIC (N/A)  Patient Location: PACU  Anesthesia Type:General  Level of Consciousness: awake, alert  and oriented  Airway & Oxygen Therapy: Patient Spontanous Breathing and Patient connected to nasal cannula oxygen  Post-op Assessment: Report given to PACU RN, Post -op Vital signs reviewed and stable and Patient moving all extremities  Post vital signs: Reviewed and stable  Complications: No apparent anesthesia complications

## 2013-09-28 NOTE — ED Notes (Signed)
Pain is worsening.  No nausea at this time.

## 2013-09-28 NOTE — ED Notes (Signed)
Pt done drinking contrast.  Called CT and advised.

## 2013-09-28 NOTE — Op Note (Signed)
OPERATIVE REPORT  DATE OF OPERATION: 09/28/2013  PATIENT:  Angela [name]  18 y.o. female  PRE-OPERATIVE DIAGNOSIS:  Early acute appendicitis  POST-OPERATIVE DIAGNOSIS:  Possible early appendicitis, adhesions   PROCEDURE:  Procedure(s): APPENDECTOMY LAPAROSCOPIC And laparoscopic lysis of adhesions  SURGEON:  Surgeon(s): Gwenyth Ober, MD  ASSISTANT: None  ANESTHESIA:   general  EBL: <10 ml  BLOOD ADMINISTERED: none  DRAINS: none   SPECIMEN:  Source of Specimen:  appendix  COUNTS CORRECT:  YES  PROCEDURE DETAILS: The patient was taken to the operating room and placed on the table in the supine position. After an adequate general endotracheal anesthetic was administered she was prepped and draped in the usual sterile manner exposing her entire abdomen.  A proper time out was performed identifying the patient and the procedure to be performed. A supraumbilical midline incision was made using a #15 blade and taken down to the midline fascia. We incised the midline fascia using the 15 blade then grabbed the edges with Kocher clamps. We bluntly dissected down into the perineal cavity then passed a pursestring suture of 0 Vicryl around the fascial opening. This secured in place a Hassan cannula into the peritoneal cavity through which carbon dioxide gas was insufflated up to a maximal intra-abdominal pressure of 15 mm of mercury.  The patient was placed in Trendelenburg position and the left side was turned downward. A right upper quadrant 5 mm cannula and a left low quadrant 11-12 mm cannula was passed under direct vision.  The appendix was tucked behind the terminal ileum which was tethered to the right inferior lateral wall of the pelvis by adhesions. We subsequently took down these adhesions using a Harmonic Scalpel. The appendix was dissected out at the base of the cecum using a dissector and we came across using an Endo GIA blue cartridge GIA stapler. The mesoappendix was taken  using the Harmonic scalpel. Once this was done the appendix was completely detached we retrieved it from the left low quadrant using an Endo Catch bag.  Was appendix was removed we inspected the area for any other bleeding. We inspected the pelvic organs the uterus and ovaries and all appeared to be normal.  The supraumbilical fascial site was closed using the pursestring suture which was in place. We injected 0.25% Marcaine with epinephrine at all sites. The left low quadrant and a super umbilical skin sites were closed using running subcuticular stitches of 4-0 Monocryl. Dermabond Steri-Strips and Tegaderm she used to complete our dressings. All needle counts, sponge counts, and instrument counts were correct.  PATIENT DISPOSITION:  PACU - hemodynamically stable.   Gwenyth Ober 5/3/20158:56 PM

## 2013-09-28 NOTE — Anesthesia Preprocedure Evaluation (Signed)
Anesthesia Evaluation  Patient identified by MRN, date of birth, ID band Patient awake    History of Anesthesia Complications (+) PONV, Emergence Delirium and history of anesthetic complications  Airway Mallampati: II TM Distance: >3 FB Neck ROM: Full    Dental  (+) Teeth Intact, Dental Advisory Given   Pulmonary          Cardiovascular     Neuro/Psych  Headaches,    GI/Hepatic GERD-  ,  Endo/Other    Renal/GU      Musculoskeletal   Abdominal   Peds  Hematology   Anesthesia Other Findings   Reproductive/Obstetrics                           Anesthesia Physical Anesthesia Plan  ASA: II and emergent  Anesthesia Plan: General   Post-op Pain Management:    Induction: Intravenous, Rapid sequence and Cricoid pressure planned  Airway Management Planned: Oral ETT  Additional Equipment:   Intra-op Plan:   Post-operative Plan: Extubation in OR  Informed Consent: I have reviewed the patients History and Physical, chart, labs and discussed the procedure including the risks, benefits and alternatives for the proposed anesthesia with the patient or authorized representative who has indicated his/her understanding and acceptance.   Dental advisory given  Plan Discussed with: CRNA, Anesthesiologist and Surgeon  Anesthesia Plan Comments:         Anesthesia Quick Evaluation

## 2013-09-28 NOTE — Anesthesia Procedure Notes (Signed)
Procedure Name: Intubation Date/Time: 09/28/2013 7:46 PM Performed by: Vaughan Browner Pre-anesthesia Checklist: Patient identified, Emergency Drugs available, Suction available and Patient being monitored Patient Re-evaluated:Patient Re-evaluated prior to inductionOxygen Delivery Method: Circle system utilized Preoxygenation: Pre-oxygenation with 100% oxygen Intubation Type: IV induction, Rapid sequence and Cricoid Pressure applied Laryngoscope Size: Mac and 3 Grade View: Grade I Tube type: Oral Tube size: 7.0 mm Number of attempts: 1 Airway Equipment and Method: Stylet Placement Confirmation: ETT inserted through vocal cords under direct vision,  positive ETCO2 and breath sounds checked- equal and bilateral Secured at: 21 cm Tube secured with: Tape Dental Injury: Teeth and Oropharynx as per pre-operative assessment

## 2013-09-29 LAB — GC/CHLAMYDIA PROBE AMP
CT Probe RNA: NEGATIVE
GC PROBE AMP APTIMA: NEGATIVE

## 2013-09-29 MED ORDER — OXYCODONE-ACETAMINOPHEN 5-325 MG PO TABS: 1.0000 | ORAL_TABLET | ORAL | Status: DC | PRN

## 2013-09-29 MED ORDER — IBUPROFEN 600 MG PO TABS: 600.0000 mg | ORAL_TABLET | Freq: Three times a day (TID) | ORAL | Status: DC

## 2013-09-29 MED ORDER — SODIUM CHLORIDE 0.9 % IJ SOLN: 3.0000 mL | INTRAMUSCULAR | Status: DC | PRN

## 2013-09-29 MED ORDER — IBUPROFEN 600 MG PO TABS
600.0000 mg | ORAL_TABLET | Freq: Three times a day (TID) | ORAL | Status: DC
  Administered 2013-09-29: 600 mg via ORAL
  Filled 2013-09-29: qty 1

## 2013-09-29 MED ORDER — SODIUM CHLORIDE 0.9 % IJ SOLN: 3.0000 mL | Freq: Two times a day (BID) | INTRAMUSCULAR | Status: DC

## 2013-09-29 NOTE — Progress Notes (Signed)
AVS discharge instructions were given and went over with patient and her mother. Patient was given prescriptions for percocet and ibuprofen to take to her pharmacy. Patient stated that she did not have any questions. Staff assisted patient to her transportation.

## 2013-09-29 NOTE — Discharge Summary (Addendum)
Physician Discharge Summary  Patient ID: Angela [name] MRN: [id number] DOB/AGE: [birth year] 18 y.o.  Admit date: 09/28/2013 Discharge date: 09/29/2013  Admitting Diagnosis: Abdominal pain   Discharge Diagnosis Patient Active Problem List   Diagnosis Date Noted  . Acute appendicitis 09/28/2013  . Migraine variant 10/30/2012  . Tension headache 10/30/2012  . Bilateral occipital neuralgia 10/30/2012  . GERD (gastroesophageal reflux disease) 07/15/2012    Consultants none  Imaging: Ct Abdomen Pelvis W Contrast  09/28/2013   CLINICAL DATA:  Right lower quadrant pain.  Nausea and vomiting.  EXAM: CT ABDOMEN AND PELVIS WITH CONTRAST  TECHNIQUE: Multidetector CT imaging of the abdomen and pelvis was performed using the standard protocol following bolus administration of intravenous contrast.  CONTRAST:  63mL OMNIPAQUE IOHEXOL 300 MG/ML  SOLN  COMPARISON:  None.  FINDINGS: The appendix curls all will need posterior tip of the cecum. It measures 6 mm in greatest diameter. It contains air. There is no appendiceal wall thickening. Appears, however, mild adjacent hazy inflammatory change.  And moderate increased stool in the colon. No colonic wall thickening are other areas of inflammation. The small bowel is unremarkable.  The uterus and is unremarkable. Left ovary is mildly prominent, likely from a dominant follicular cyst.  Clear lung bases.  The heart is normal size.  Normal liver, spleen, gallbladder and pancreas. No bile duct dilation. Normal adrenal glands. Normal kidneys. Normal ureters. Normal bladder. No pathologically enlarged lymph nodes are appreciated.  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 No other bony abnormality.  IMPRESSION: 1. Appendix is at the upper limits of normal in diameter. There is no wall thickening and air is seen in the appendix. However, there is periappendiceal inflammatory type change. The findings are equivocal. They could reflect  early appendicitis. The apparent inflammatory change may alternatively reflect a small amount of inferior pericolic gutter nonspecific fluid. Consider a short-term followup CT to re-evaluate the appendix, with repeat CT imaging in 12 the 24 hours. 2. Moderate increased stool in the colon. 3. 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 4. No other abnormalities.   Electronically Signed   By: Lajean Manes M.D.   On: 09/28/2013 16:28    Procedures Laparoscopic appendectomy---Dr. Hulen Skains 09/28/13  Hospital Course:  Angela [name] is a 18 year old female who presented to [facility] with RLQ abdominal pain.  Workup showed possible acute early appendicitis.  Patient was admitted and underwent procedure listed above.  Tolerated procedure well and was transferred to the floor.  Diet was advanced as tolerated.  On POD#1, the patient was voiding well, tolerating diet, ambulating well, pain well controlled, vital signs stable, incisions c/d/i and felt stable for discharge home.  Patient will follow up in our office in 2 weeks and knows to call with questions or concerns.  Physical Exam: General:  Alert, NAD, pleasant, comfortable Abd:  Soft, ND, mild tenderness, incisions C/D/I    Medication List    STOP taking these medications       meloxicam 15 MG tablet  Commonly known as:  MOBIC      TAKE these medications       acetaminophen 500 MG tablet  Commonly known as:  TYLENOL  Take 1,000 mg by mouth every 6 (six) hours as needed.     amitriptyline 50 MG tablet  Commonly known as:  ELAVIL  Take 50 mg by mouth at bedtime.     cetirizine 10 MG tablet  Commonly known as:  ZYRTEC  Take  10 mg by mouth at bedtime.     ibuprofen 600 MG tablet  Commonly known as:  ADVIL,MOTRIN  Take 1 tablet (600 mg total) by mouth 3 (three) times daily.     oxyCODONE-acetaminophen 5-325 MG per tablet  Commonly known as:  PERCOCET/ROXICET  Take 1-2 tablets by mouth every 4 (four) hours as needed for moderate pain.     PRESCRIPTION MEDICATION   Take 1 tablet by mouth daily. Doxycycline-like antibiotic     rizatriptan 10 MG tablet  Commonly known as:  MAXALT  Take 1 tablet (10 mg total) by mouth as needed for migraine. May repeat in 2 hours if needed     tiZANidine 4 MG tablet  Commonly known as:  ZANAFLEX  Take 1 tablet (4 mg total) by mouth 2 (two) times daily as needed.             Follow-up Information   Follow up with [facility] On 10/21/2013. (arrive by 1:30 for a 2PM appt for a post op check)    Contact information:   [place] [address] [phone]       Signed: Erby Pian, [facility] Surgery [phone]  09/29/2013, 1:14 PM  Agree with above.  Alphonsa Overall, MD, [facility] Surgery Pager: [phone] Office phone:  [phone]

## 2013-09-29 NOTE — Progress Notes (Signed)
Patient ID: Angela [name], female   DOB: 07-06-1995, 18 y.o.   MRN: [id number]  Subjective: Pt very groggy, got IV pain meds.  Tolerating diet.  Voiding.  No n/v.  Afebrile.    Objective:  Vital signs:  Filed Vitals:   09/28/13 2204 09/28/13 2237 09/29/13 0221 09/29/13 0623  BP: 122/65 118/68 108/54 103/54  Pulse: 99 104 111 91  Temp:  97.6 F (36.4 C) 98.6 F (37 C) 98.4 F (36.9 C)  TempSrc:  Oral Oral Oral  Resp: 13 16 16 16   Height:  5' 6"  (1.676 m)    Weight:  180 lb 3.2 oz (81.738 kg)    SpO2: 100% 100% 99% 99%       Intake/Output   Yesterday:  05/03 0701 - 05/04 0700 In: 2200 [I.V.:2200] Out: 160 [Urine:150; Blood:10] This shift:    I/O last 3 completed shifts: In: 2200 [I.V.:2200] Out: 160 [Urine:150; Blood:10]   Physical Exam: General: Pt awake/alert/oriented x4 in no acute distress Abdomen: Soft.  Nondistended.  +bs. Appropriately tender.  Incisions are c/d/i.  No evidence of peritonitis.  No incarcerated hernias.    Problem List:   Active Problems:   Acute appendicitis    Results:   Labs: Results for orders placed during the hospital encounter of 09/28/13 (from the past 48 hour(s))  CBC WITH DIFFERENTIAL     Status: None   Collection Time    09/28/13 11:34 AM      Result Value Ref Range   WBC 7.1  4.0 - 10.5 K/uL   RBC 4.62  3.87 - 5.11 MIL/uL   Hemoglobin 13.7  12.0 - 15.0 g/dL   HCT 40.5  36.0 - 46.0 %   MCV 87.7  78.0 - 100.0 fL   MCH 29.7  26.0 - 34.0 pg   MCHC 33.8  30.0 - 36.0 g/dL   RDW 13.2  11.5 - 15.5 %   Platelets 263  150 - 400 K/uL   Neutrophils Relative % 49  43 - 77 %   Neutro Abs 3.4  1.7 - 7.7 K/uL   Lymphocytes Relative 40  12 - 46 %   Lymphs Abs 2.9  0.7 - 4.0 K/uL   Monocytes Relative 8  3 - 12 %   Monocytes Absolute 0.6  0.1 - 1.0 K/uL   Eosinophils Relative 3  0 - 5 %   Eosinophils Absolute 0.2  0.0 - 0.7 K/uL   Basophils Relative 0  0 - 1 %   Basophils Absolute 0.0  0.0 - 0.1 K/uL  COMPREHENSIVE METABOLIC  PANEL     Status: None   Collection Time    09/28/13 11:34 AM      Result Value Ref Range   Sodium 139  137 - 147 mEq/L   Potassium 4.7  3.7 - 5.3 mEq/L   Chloride 103  96 - 112 mEq/L   CO2 22  19 - 32 mEq/L   Glucose, Bld 90  70 - 99 mg/dL   BUN 11  6 - 23 mg/dL   Creatinine, Ser 0.74  0.50 - 1.10 mg/dL   Calcium 9.1  8.4 - 10.5 mg/dL   Total Protein 7.4  6.0 - 8.3 g/dL   Albumin 4.0  3.5 - 5.2 g/dL   AST 20  0 - 37 U/L   ALT 13  0 - 35 U/L   Alkaline Phosphatase 86  39 - 117 U/L   Total Bilirubin 0.5  0.3 - 1.2 mg/dL  GFR calc non Af Amer >90  >90 mL/min   GFR calc Af Amer >90  >90 mL/min   Comment: (NOTE)     The eGFR has been calculated using the CKD EPI equation.     This calculation has not been validated in all clinical situations.     eGFR's persistently <90 mL/min signify possible Chronic Kidney     Disease.  LIPASE, BLOOD     Status: None   Collection Time    09/28/13 11:56 AM      Result Value Ref Range   Lipase 33  11 - 59 U/L  URINALYSIS, ROUTINE W REFLEX MICROSCOPIC     Status: None   Collection Time    09/28/13 12:03 PM      Result Value Ref Range   Color, Urine YELLOW  YELLOW   APPearance CLEAR  CLEAR   Specific Gravity, Urine 1.008  1.005 - 1.030   pH 6.5  5.0 - 8.0   Glucose, UA NEGATIVE  NEGATIVE mg/dL   Hgb urine dipstick NEGATIVE  NEGATIVE   Bilirubin Urine NEGATIVE  NEGATIVE   Ketones, ur NEGATIVE  NEGATIVE mg/dL   Protein, ur NEGATIVE  NEGATIVE mg/dL   Urobilinogen, UA 0.2  0.0 - 1.0 mg/dL   Nitrite NEGATIVE  NEGATIVE   Leukocytes, UA NEGATIVE  NEGATIVE   Comment: MICROSCOPIC NOT DONE ON URINES WITH NEGATIVE PROTEIN, BLOOD, LEUKOCYTES, NITRITE, OR GLUCOSE <1000 mg/dL.  POC URINE PREG, ED     Status: None   Collection Time    09/28/13 12:09 PM      Result Value Ref Range   Preg Test, Ur NEGATIVE  NEGATIVE   Comment:            THE SENSITIVITY OF THIS     METHODOLOGY IS >24 mIU/mL  WET PREP, GENITAL     Status: Abnormal   Collection Time     09/28/13  1:16 PM      Result Value Ref Range   Yeast Wet Prep HPF POC NONE SEEN  NONE SEEN   Trich, Wet Prep NONE SEEN  NONE SEEN   Clue Cells Wet Prep HPF POC NONE SEEN  NONE SEEN   WBC, Wet Prep HPF POC TOO NUMEROUS TO COUNT (*) NONE SEEN    Imaging / Studies: Ct Abdomen Pelvis W Contrast  09/28/2013   CLINICAL DATA:  Right lower quadrant pain.  Nausea and vomiting.  EXAM: CT ABDOMEN AND PELVIS WITH CONTRAST  TECHNIQUE: Multidetector CT imaging of the abdomen and pelvis was performed using the standard protocol following bolus administration of intravenous contrast.  CONTRAST:  16m OMNIPAQUE IOHEXOL 300 MG/ML  SOLN  COMPARISON:  None.  FINDINGS: The appendix curls all will need posterior tip of the cecum. It measures 6 mm in greatest diameter. It contains air. There is no appendiceal wall thickening. Appears, however, mild adjacent hazy inflammatory change.  And moderate increased stool in the colon. No colonic wall thickening are other areas of inflammation. The small bowel is unremarkable.  The uterus and is unremarkable. Left ovary is mildly prominent, likely from a dominant follicular cyst.  Clear lung bases.  The heart is normal size.  Normal liver, spleen, gallbladder and pancreas. No bile duct dilation. Normal adrenal glands. Normal kidneys. Normal ureters. Normal bladder. No pathologically enlarged lymph nodes are appreciated.  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 No other bony abnormality.  IMPRESSION: 1. Appendix is at the upper limits of  normal in diameter. There is no wall thickening and air is seen in the appendix. However, there is periappendiceal inflammatory type change. The findings are equivocal. They could reflect early appendicitis. The apparent inflammatory change may alternatively reflect a small amount of inferior pericolic gutter nonspecific fluid. Consider a short-term followup CT to re-evaluate the appendix, with repeat CT imaging  in 12 the 24 hours. 2. Moderate increased stool in the colon. 3. 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 4. No other abnormalities.   Electronically Signed   By: Lajean Manes M.D.   On: 09/28/2013 16:28    Medications / Allergies: per chart  Antibiotics: Anti-infectives   Start     Dose/Rate Route Frequency Ordered Stop   09/29/13 0600  [MAR Hold]  ertapenem (INVANZ) 1 g in sodium chloride 0.9 % 50 mL IVPB     (On MAR Hold since 09/28/13 1940)   1 g 100 mL/hr over 30 Minutes Intravenous On call to O.R. 09/28/13 1811 09/28/13 1947      Assessment/Plan S/p laparoscopic appendectomy---Dr. Hulen Skains POD#1 -DC IV pain meds and adjust PO  -mobilize -IS -SCD/lovenox -anticipate discharge this afternoon once she is more alert and pain better controlled.  Erby Pian, [facility] Surgery Pager [phone] Office [phone]  09/29/2013 8:52 AM  Agree with above.  Alphonsa Overall, MD, [facility] Surgery Pager: [phone] Office phone:  [phone]

## 2013-09-29 NOTE — Anesthesia Postprocedure Evaluation (Signed)
  Anesthesia Post-op Note  Patient: Angela [name]  Procedure(s) Performed: Procedure(s): APPENDECTOMY LAPAROSCOPIC (N/A)  Patient Location: PACU  Anesthesia Type:General  Level of Consciousness: awake, alert  and oriented  Airway and Oxygen Therapy: Patient Spontanous Breathing and Patient connected to nasal cannula oxygen  Post-op Pain: mild  Post-op Assessment: Post-op Vital signs reviewed, Patient's Cardiovascular Status Stable, Respiratory Function Stable, Patent Airway, No signs of Nausea or vomiting and Pain level controlled  Post-op Vital Signs: stable  Last Vitals:  Filed Vitals:   09/29/13 0221  BP: 108/54  Pulse: 111  Temp: 37 C  Resp: 16    Complications: No apparent anesthesia complications

## 2013-09-29 NOTE — Discharge Instructions (Signed)

## 2013-09-30 ENCOUNTER — Encounter (INDEPENDENT_AMBULATORY_CARE_PROVIDER_SITE_OTHER): Admitting: Surgery

## 2013-09-30 ENCOUNTER — Encounter (HOSPITAL_COMMUNITY): Payer: Self-pay | Admitting: General Surgery

## 2013-09-30 ENCOUNTER — Telehealth (INDEPENDENT_AMBULATORY_CARE_PROVIDER_SITE_OTHER): Payer: Self-pay

## 2013-09-30 NOTE — Telephone Encounter (Signed)
Patient's mother calling into office to report that her daughter (patient) experienced light headedness near fainting this morning with cold sweats.  Patient denies having any pain or fever. Reviewed with Emina and advised that patient need's to push fluids and stay hydrated.  If no improvement patient need's to come into our Urgent Office for further evaluation.  Appointment has been held for 3:00pm today.  Patients mother will call our office back around noon to update patients status.  Patient status post Laparoscopic Appendectomy on 09/28/13.

## 2013-09-30 NOTE — Telephone Encounter (Signed)
Called and spoke to patient's mother, she reports that her daughter is sleeping and feels much better now.  Advised patients mother to call our office if her symptoms return and we can schedule an appointment in our Urgent Office if needed.  Patients mother verbalized understanding at this time and agrees with the plan as above.

## 2013-10-01 ENCOUNTER — Other Ambulatory Visit: Payer: Self-pay | Admitting: General Surgery

## 2013-10-01 ENCOUNTER — Telehealth (INDEPENDENT_AMBULATORY_CARE_PROVIDER_SITE_OTHER): Payer: Self-pay | Admitting: General Surgery

## 2013-10-01 MED ORDER — TRAMADOL HCL 50 MG PO TABS: 50.0000 mg | ORAL_TABLET | Freq: Four times a day (QID) | ORAL | Status: DC | PRN

## 2013-10-01 NOTE — Telephone Encounter (Signed)
Pt mother aware script up front for pick up. Advised Dr Hulen Skains ok with pulse as long as it is not >120.

## 2013-10-01 NOTE — Telephone Encounter (Signed)
Patient's mother called in to explain that her daughter is still having severe pain and has been having to alternate between the percocet and ibuprofen.  Because of the narcotics, the patient has yet to have a bowel movement since her surgery date of 09/28/13.  Patient is passing flatus, and has active bowel sounds x4 quadrants according to mother.  They have tried miralax, sennokot, and colace with no success.  The patient is staying very well hydrated and has been moving around as much as possible in between the pain.  The mother would like to see if we could decrease her pain medication to Tramadol so that it will not act as harshly on her daughters bowels.  Informed the mother that I would send this message to Dr. Hulen Skains and his assistant and as soon as we hear something back we will give them a call.

## 2013-10-01 NOTE — Telephone Encounter (Signed)
Spoke with pt's mother to explain that the Rx for tramadol was filled by Dr. Hulen Skains and that it would be ready for pickup after 3:30 today.  Asked the mom if the patient was having any fever or chills and she denied this but explained that it was hard to get an accurate temp due to the round the clock medications. She explained that the daughter did complain of being achy especially in the shoulders.  Asked the mother to take a pulse on the patient.  Patient was sleeping and heart rate was at 106.  Informed them that I would send this message to Dr. Hulen Skains again and that we would get in touch with them if he wanted to progress with any studies or labs.

## 2013-10-03 ENCOUNTER — Telehealth (INDEPENDENT_AMBULATORY_CARE_PROVIDER_SITE_OTHER): Payer: Self-pay

## 2013-10-03 NOTE — Telephone Encounter (Signed)
Patients mother  is asking if patient can get into a pool. Advised her DR. Wyatt normally likes for patient to be seen for post op before getting into a pool to make sure incision are healing well, but since patients appointment is one month out, patient she be able to get in the pool  after steri strips has came off and incision areas do not have any signs of infection usually that is 3-4 weeks out form surgery  . Mother verbalized understanding

## 2013-10-06 ENCOUNTER — Ambulatory Visit (INDEPENDENT_AMBULATORY_CARE_PROVIDER_SITE_OTHER): Admitting: General Surgery

## 2013-10-06 ENCOUNTER — Encounter (HOSPITAL_COMMUNITY): Payer: Self-pay

## 2013-10-06 ENCOUNTER — Ambulatory Visit (HOSPITAL_COMMUNITY)
Admission: RE | Admit: 2013-10-06 | Discharge: 2013-10-06 | Disposition: A | Discharge status: Home / Self Care | Source: Ambulatory Visit | Attending: General Surgery | Admitting: General Surgery

## 2013-10-06 ENCOUNTER — Telehealth (INDEPENDENT_AMBULATORY_CARE_PROVIDER_SITE_OTHER): Payer: Self-pay

## 2013-10-06 VITALS — BP 98/60 | HR 88 | Temp 98.6°F | Resp 18 | Ht 67.0 in | Wt 169.0 lb

## 2013-10-06 DIAGNOSIS — R109 Unspecified abdominal pain: Secondary | ICD-10-CM

## 2013-10-06 DIAGNOSIS — R509 Fever, unspecified: Secondary | ICD-10-CM

## 2013-10-06 DIAGNOSIS — T8140XA Infection following a procedure, unspecified, initial encounter: Secondary | ICD-10-CM

## 2013-10-06 DIAGNOSIS — T8149XA Infection following a procedure, other surgical site, initial encounter: Secondary | ICD-10-CM | POA: Insufficient documentation

## 2013-10-06 DIAGNOSIS — Z9089 Acquired absence of other organs: Secondary | ICD-10-CM | POA: Insufficient documentation

## 2013-10-06 MED ORDER — DOXYCYCLINE HYCLATE 50 MG PO CAPS: 50.0000 mg | ORAL_CAPSULE | Freq: Two times a day (BID) | ORAL | Status: DC

## 2013-10-06 MED ORDER — IOHEXOL 300 MG/ML  SOLN
50.0000 mL | Freq: Once | INTRAMUSCULAR | Status: AC | PRN
  Administered 2013-10-06: 50 mL via ORAL

## 2013-10-06 MED ORDER — METRONIDAZOLE 500 MG PO TABS: 500.0000 mg | ORAL_TABLET | Freq: Three times a day (TID) | ORAL | Status: AC

## 2013-10-06 MED ORDER — IOHEXOL 300 MG/ML  SOLN
100.0000 mL | Freq: Once | INTRAMUSCULAR | Status: AC | PRN
  Administered 2013-10-06: 100 mL via INTRAVENOUS

## 2013-10-06 NOTE — Telephone Encounter (Signed)
Message copied by Carlene Coria on Mon Oct 06, 2013  4:34 PM ------      Message from: Hunt Oris      Created: Mon Oct 06, 2013  4:00 PM      Regarding: Questions in Regards to Today's Visit      Contact: [phone]       Pt was seen today by Dr. Hulen Skains and Petra Kuba (mother) has questions regarding meds and other related medical questions...df ------

## 2013-10-06 NOTE — Progress Notes (Signed)
Subjective:     Patient ID: Angela [name], female   DOB: 1996-01-18, 18 y.o.   MRN: [id number]  HPI The patient comes in today with redness of her wounds, all three.  Also has had low grade fevers at home, but no fever in the office today.  States that hse has pain with ambulation.  She does not look toxic.  Review of Systems Low grade fevers.  Abdominal pain.  Drainage from her wounds, particularly her umbilical wound.     Objective:   Physical Exam Redness without fluctuance at all trocar sites.  Cloudy, non-foul smelling drainage from her  Umbilical wound with.  Much of the erythema of her wounds seems to be reaction to adhesive tape.  Some mild diffuse abdominal discomfort with deep palpation.  I redressed the wounds with 1/2 inch eye tape and triple antibiotic ointment and 2x2 gauze.       Assessment:     Wound reaction to adhsive tape and possible ound infection Patient started on Cipro yesterday, but now has a rash it seems related to that. No very concerned for intra-abdominal abscess, but because of the discomfort with ambulation and mild diffuse discomfort, will get CT of her abdomen and pelvis      Plan:     Discontinued ciprofloxacin Add doxycycline CT scan of the abdomen and pelvis with contrast Patient and her mother have been isntructedon the wound care--shower, pat dry, cover with antibiotic ointment and 2x2 gauze.  Use as little tape as possible to keep dressing in place.

## 2013-10-06 NOTE — Telephone Encounter (Signed)
Called pt mother back. She states no abx was given to them when the left. States doxy was electronically sent to pharmacy. She asked about flagyl. Did not see in med list asked Dr Hulen Skains and he said he would go back and enter it in. Told mother it would also be sent electronically.

## 2013-10-06 NOTE — Addendum Note (Signed)
Addended by: Doreen Salvage on: 10/06/2013 04:22 PM   Modules accepted: Orders

## 2013-10-08 ENCOUNTER — Telehealth (INDEPENDENT_AMBULATORY_CARE_PROVIDER_SITE_OTHER): Payer: Self-pay

## 2013-10-08 NOTE — Telephone Encounter (Signed)
Called pt mom back and told her to stop flagyl and only take doxy. Pt mother understanding.

## 2013-10-08 NOTE — Telephone Encounter (Signed)
Pt s/p lap appy. Pt is on doxy and Flagyl. Mother states that she started having a itchy rash after starting the Flagyl. Mother states that she has taking doxy before with no reactions. Advised pt to take Benadryl 25mg  1 tab every 6 hours for the itching. Informed pt that I  would send Dr Hulen Skains a message to see what his recommendations are for the Flagyl. Mother would like to stop the Flagyl and start another abx if possible. Please advise.

## 2013-10-10 ENCOUNTER — Telehealth: Payer: Self-pay | Admitting: *Deleted

## 2013-10-10 NOTE — Telephone Encounter (Signed)
DEXILANT 30 MG APPROVED INDEFINITELY CASE # IS [id number]  PATIENT AWARE

## 2013-10-14 ENCOUNTER — Ambulatory Visit (INDEPENDENT_AMBULATORY_CARE_PROVIDER_SITE_OTHER): Admitting: General Surgery

## 2013-10-14 ENCOUNTER — Encounter (INDEPENDENT_AMBULATORY_CARE_PROVIDER_SITE_OTHER): Payer: Self-pay | Admitting: General Surgery

## 2013-10-14 ENCOUNTER — Encounter (INDEPENDENT_AMBULATORY_CARE_PROVIDER_SITE_OTHER): Payer: Self-pay

## 2013-10-14 VITALS — BP 114/70 | HR 103 | Temp 97.0°F | Ht 67.0 in | Wt 167.0 lb

## 2013-10-14 DIAGNOSIS — Z09 Encounter for follow-up examination after completed treatment for conditions other than malignant neoplasm: Secondary | ICD-10-CM

## 2013-10-14 NOTE — Progress Notes (Signed)
Subjective:     Patient ID: Angela [name], female   DOB: 23-Jan-1996, 18 y.o.   MRN: [id number]  HPI The patient is doing much better. Her wounds are healing well with no evidence of infection or redness. She's had no fevers or chills.  Review of Systems     Objective:   Physical Exam No abdominal pain or discomfort. Wounds are healing well without redness or drainage. There was a small piece of Monocryl on the lateral aspect of her left lower quadrant incision that was removed in the office without event.    Assessment:     Doing well status post laparoscopic appendectomy     Plan:     She is released to full activity. She can return to see me on an as-needed basis.  She is to continue her doxycycline until she has run out.

## 2013-10-17 ENCOUNTER — Telehealth (INDEPENDENT_AMBULATORY_CARE_PROVIDER_SITE_OTHER): Payer: Self-pay

## 2013-10-17 NOTE — Telephone Encounter (Signed)
Called pt mom and told her she is ok to swim in pool. Keep sun screen on incision. She states daughter was suppose to get lovenox shot prior to leaving hospital. [facility] was asking who to call about why she never got it or if she did and she wasn't aware. Advised her to call hospital medical records.

## 2013-10-17 NOTE — Telephone Encounter (Signed)
Message copied by Carlene Coria on Fri Oct 17, 2013  4:22 PM ------      Message from: Salvatore Marvel      Created: Thu Oct 16, 2013  1:23 PM      Regarding: Dr. Thermon Leyland      Contact: [phone]       Pt's Mother Raychell Holcomb called and wants to know when her Daughther can go in the pool because her Lap Appy was on May 5,2015, please call her.            Thank you. ------

## 2013-10-21 ENCOUNTER — Encounter (INDEPENDENT_AMBULATORY_CARE_PROVIDER_SITE_OTHER): Payer: Self-pay

## 2013-10-22 ENCOUNTER — Other Ambulatory Visit (HOSPITAL_COMMUNITY): Payer: Self-pay | Admitting: Pediatrics

## 2013-10-22 DIAGNOSIS — R109 Unspecified abdominal pain: Secondary | ICD-10-CM

## 2013-10-24 ENCOUNTER — Ambulatory Visit (HOSPITAL_COMMUNITY)
Admission: RE | Admit: 2013-10-24 | Discharge: 2013-10-24 | Disposition: A | Discharge status: Home / Self Care | Source: Ambulatory Visit | Attending: Pediatrics | Admitting: Pediatrics

## 2013-10-24 DIAGNOSIS — R109 Unspecified abdominal pain: Secondary | ICD-10-CM

## 2013-10-27 ENCOUNTER — Telehealth: Payer: Self-pay | Admitting: Gastroenterology

## 2013-10-27 ENCOUNTER — Ambulatory Visit (HOSPITAL_COMMUNITY)
Admission: RE | Admit: 2013-10-27 | Discharge: 2013-10-27 | Disposition: A | Discharge status: Home / Self Care | Source: Ambulatory Visit | Attending: Pediatrics | Admitting: Pediatrics

## 2013-10-27 DIAGNOSIS — R109 Unspecified abdominal pain: Secondary | ICD-10-CM | POA: Insufficient documentation

## 2013-10-27 NOTE — Telephone Encounter (Signed)
Pt has been having abdominal pain and c/o nausea after she eats. Request sooner appt than 1st available. Pt scheduled to see Alonza Bogus PA 10/30/13@3pm . Pt aware of appt.

## 2013-10-28 ENCOUNTER — Encounter (INDEPENDENT_AMBULATORY_CARE_PROVIDER_SITE_OTHER)

## 2013-10-28 ENCOUNTER — Encounter: Payer: Self-pay | Admitting: *Deleted

## 2013-10-29 ENCOUNTER — Encounter: Payer: Self-pay | Admitting: *Deleted

## 2013-10-30 ENCOUNTER — Encounter: Payer: Self-pay | Admitting: Gastroenterology

## 2013-10-30 ENCOUNTER — Ambulatory Visit (INDEPENDENT_AMBULATORY_CARE_PROVIDER_SITE_OTHER): Admitting: Gastroenterology

## 2013-10-30 VITALS — BP 100/78 | HR 100 | Ht 67.0 in | Wt 167.6 lb

## 2013-10-30 DIAGNOSIS — K219 Gastro-esophageal reflux disease without esophagitis: Secondary | ICD-10-CM

## 2013-10-30 DIAGNOSIS — R109 Unspecified abdominal pain: Secondary | ICD-10-CM | POA: Insufficient documentation

## 2013-10-30 DIAGNOSIS — K5900 Constipation, unspecified: Secondary | ICD-10-CM

## 2013-10-30 DIAGNOSIS — R110 Nausea: Secondary | ICD-10-CM

## 2013-10-30 MED ORDER — HYOSCYAMINE SULFATE 0.125 MG SL SUBL: 0.1250 mg | SUBLINGUAL_TABLET | Freq: Three times a day (TID) | SUBLINGUAL | Status: DC | PRN

## 2013-10-30 MED ORDER — DEXLANSOPRAZOLE 60 MG PO CPDR: 60.0000 mg | DELAYED_RELEASE_CAPSULE | Freq: Every day | ORAL | Status: DC

## 2013-10-30 MED ORDER — LINACLOTIDE 145 MCG PO CAPS: 145.0000 ug | ORAL_CAPSULE | Freq: Every day | ORAL | Status: DC

## 2013-10-30 NOTE — Patient Instructions (Signed)
You have been scheduled for a HIDA scan at [facility] Radiology (1st floor) on 11-12-2013. Please arrive 15 minutes prior to your scheduled appointment at  1 pm. Make certain not to have anything to eat or drink at least 6 hours prior to your test. Should this appointment date or time not work well for you, please call radiology scheduling at [phone].  _____________________________________________________________________ hepatobiliary (HIDA) scan is an imaging procedure used to diagnose problems in the liver, gallbladder and bile ducts. In the HIDA scan, a radioactive chemical or tracer is injected into a vein in your arm. The tracer is handled by the liver like bile. Bile is a fluid produced and excreted by your liver that helps your digestive system break down fats in the foods you eat. Bile is stored in your gallbladder and the gallbladder releases the bile when you eat a meal. A special nuclear medicine scanner (gamma camera) tracks the flow of the tracer from your liver into your gallbladder and small intestine.  During your HIDA scan  You'll be asked to change into a hospital gown before your HIDA scan begins. Your health care team will position you on a table, usually on your back. The radioactive tracer is then injected into a vein in your arm.The tracer travels through your bloodstream to your liver, where it's taken up by the bile-producing cells. The radioactive tracer travels with the bile from your liver into your gallbladder and through your bile ducts to your small intestine.You may feel some pressure while the radioactive tracer is injected into your vein. As you lie on the table, a special gamma camera is positioned over your abdomen taking pictures of the tracer as it moves through your body. The gamma camera takes pictures continually for about an hour. You'll need to keep still during the HIDA scan. This can become uncomfortable, but you may find that you can lessen the discomfort by  taking deep breaths and thinking about other things. Tell your health care team if you're uncomfortable. The radiologist will watch on a computer the progress of the radioactive tracer through your body. The HIDA scan may be stopped when the radioactive tracer is seen in the gallbladder and enters your small intestine. This typically takes about an hour. In some cases extra imaging will be performed if original images aren't satisfactory, if morphine is given to help visualize the gallbladder or if the medication CCK is given to look at the contraction of the gallbladder. This test typically takes 2 hours to complete. ________________________________________________________________________  Information on Acid Reflux is below for your review   Samples of Linzess 145 mcg was given today please take one capsule by mouth on a empty stomach once daily   We have sent the following medications to your pharmacy for you to pick up at your convenience: Dexilant  Levsin  ______________________________________________________________________________________________________  Diet for Gastroesophageal Reflux Disease, Adult Reflux (acid reflux) is when acid from your stomach flows up into the esophagus. When acid comes in contact with the esophagus, the acid causes irritation and soreness (inflammation) in the esophagus. When reflux happens often or so severely that it causes damage to the esophagus, it is called gastroesophageal reflux disease (GERD). Nutrition therapy can help ease the discomfort of GERD. FOODS OR DRINKS TO AVOID OR LIMIT  Smoking or chewing tobacco. Nicotine is one of the most potent stimulants to acid production in the gastrointestinal tract.  Caffeinated and decaffeinated coffee and black tea.  Regular or low-calorie carbonated beverages  or energy drinks (caffeine-free carbonated beverages are allowed).   Strong spices, such as black pepper, white pepper, red pepper, cayenne, curry  powder, and chili powder.  Peppermint or spearmint.  Chocolate.  High-fat foods, including meats and fried foods. Extra added fats including oils, butter, salad dressings, and nuts. Limit these to less than 8 tsp per day.  Fruits and vegetables if they are not tolerated, such as citrus fruits or tomatoes.  Alcohol.  Any food that seems to aggravate your condition. If you have questions regarding your diet, call your caregiver or a registered dietitian. OTHER THINGS THAT MAY HELP GERD INCLUDE:   Eating your meals slowly, in a relaxed setting.  Eating 5 to 6 small meals per day instead of 3 large meals.  Eliminating food for a period of time if it causes distress.  Not lying down until 3 hours after eating a meal.  Keeping the head of your bed raised 6 to 9 inches (15 to 23 cm) by using a foam wedge or blocks under the legs of the bed. Lying flat may make symptoms worse.  Being physically active. Weight loss may be helpful in reducing reflux in overweight or obese adults.  Wear loose fitting clothing EXAMPLE MEAL PLAN This meal plan is approximately 2,000 calories based on [email or web address] meal planning guidelines. Breakfast   cup cooked oatmeal.  1 cup strawberries.  1 cup low-fat milk.  1 oz almonds. Snack  1 cup cucumber slices.  6 oz yogurt (made from low-fat or fat-free milk). Lunch  2 slice whole-wheat bread.  2 oz sliced [place].  2 tsp mayonnaise.  1 cup blueberries.  1 cup snap peas. Snack  6 whole-wheat crackers.  1 oz string cheese. Dinner   cup brown rice.  1 cup mixed veggies.  1 tsp olive oil.  3 oz grilled fish. Document Released: 05/15/2005 Document Revised: 08/07/2011 Document Reviewed: 03/31/2011 [facility] Patient Information 2014 [place], [place].

## 2013-10-30 NOTE — Progress Notes (Signed)
10/30/2013 Angela [name] [id number] 17-Apr-1996   History of Present Illness:  This is a 18 year old female who has been followed by Dr. Deatra Ina for treatment of GERD. She underwent an EGD in February 2014 at which time she was found to have some non-erosive esophagitis.  She is taking Dexilant 60 mg daily for that issue.  She comes in to the office today for a few different issues. First, she is questioning further what she can do for the reflux since she does not necessarily want to have to continue the [place] forever.  Second, and she reports some chronic issues of constipation. She had been on MiraLax in the past around age 55 with no improvement in her symptoms. She as tried stool softeners as well and has taken Metamucil only on an as-needed basis in the past without any relief. She says that she usually goes 2-3 days without a bowel movement and then has to strain to move her bowels. She takes Peri-Colace or drinks castor oil as needed, which then helps her to move her bowels, but she is asking if there is something different that she can take  She recently, on 09/28/2013, underwent laparoscopic appendectomy. CT scan on 09/28/2013 showed only abnormalities of the appendix, and moderate increased stool in the colon but no other abnormalities. She subsequently underwent a repeat CT scan of abdomen and pelvis with contrast on 10/06/2013 due to infected incision site and need to rule out abscess. That CT scan showed only post appendectomy changes in the right lower quadrant with no abscess or other changes noted.  Her biggest complaint here today is abdominal pain that was present prior to her appendectomy and did not resolved with the surgery.  It is located approximately mid-abdomen and she states that it radiates around to both of her sides at times. This pain is different than the pain she had her right lower quadrant prior to appendectomy. This pain has been present for approximately 6  weeks. She states that occurs usually twice a day randomly, not necessarily related to eating and feels like someone is a "twisting my insides".  Also complains of nausea, but that has only been present for approximately 2 weeks and occurs mostly after eating but can be present throughout the entire day as well. She had abdominal ultrasound earlier this week, which was normal. A CBC at her her pediatrician's office was normal, but we're waiting on remaining labs that were performed there as well. Her mother reports that to her knowledge those were normal.   Current Medications, Allergies, Past Medical History, Past Surgical History, Family History and Social History were reviewed in [employer] record.   Physical Exam: BP 100/78  Pulse 100  Ht 5\' 7"  (1.702 m)  Wt 167 lb 9.6 oz (76.023 kg)  BMI 26.24 kg/m2  LMP 09/28/2013 General: Well developed white female in no acute distress Head: Normocephalic and atraumatic Eyes:  Sclerae anicteric, conjunctiva pink  Ears: Normal auditory acuity Lungs: Clear throughout to auscultation Heart: Regular rate and rhythm Abdomen: Soft, non-distended.  Normal bowel sounds.  Mild right sided and epigastric TTP without R/R/G. Musculoskeletal: Symmetrical with no gross deformities  Extremities: No edema  Neurological: Alert oriented x 4, grossly non-focal Psychological:  Alert and cooperative. Normal mood and affect  Assessment and Recommendations: -Abdominal pain and nausea:  Abdominal pain has been present for 6 weeks and nausea been present for approximately 2 weeks. Question if they're related. This could all  be secondary to IBS and/or her reflux and constipation, but the patient and her mother would like to pursue further testing to rule out any other issues since she may be leaving for 9 months for a mission trip next school year. Discussed HIDA scan to rule out gallbladder dysfunction as well as gastric emptying scan (delayed  gastric emptying could be contributing to her reflux issues as well).  Will try levsin 0.125 mg TID prn to treat intestinal spasm/cramping, that could be related to IBS, in the interim as well. -Constipation likely has some component of IBS, which may ultimately be the cause of her abdominal pain and nausea. She will try Linzess 145 mcg daily (samples only given for now but she can call back for a prescription if she decides to continue the medication). -GERD:  She does not want to have to take [place] forever. I advised her on GERD dietary measures/lifestyle modifications and we will give her those instructions. For now she will remain on Dexilant 60 mg daily; we will refill that medication.

## 2013-10-31 NOTE — Progress Notes (Signed)
Reviewed and agree with management.  4 IBS/constipation the dose of Linzess is 290 mcg daily.  I would switch her to this dose. Sandy Salaam. Deatra Ina, M.D., [facility]

## 2013-11-12 ENCOUNTER — Ambulatory Visit (HOSPITAL_COMMUNITY)
Admission: RE | Admit: 2013-11-12 | Discharge: 2013-11-12 | Disposition: A | Discharge status: Home / Self Care | Source: Ambulatory Visit | Attending: Gastroenterology | Admitting: Gastroenterology

## 2013-11-12 ENCOUNTER — Encounter (HOSPITAL_COMMUNITY): Payer: Self-pay

## 2013-11-12 DIAGNOSIS — R110 Nausea: Secondary | ICD-10-CM

## 2013-11-12 DIAGNOSIS — R109 Unspecified abdominal pain: Secondary | ICD-10-CM | POA: Insufficient documentation

## 2013-11-12 MED ORDER — SINCALIDE 5 MCG IJ SOLR: 0.0200 ug/kg | Freq: Once | INTRAMUSCULAR | Status: DC

## 2013-11-12 MED ORDER — TECHNETIUM TC 99M MEBROFENIN IV KIT
5.3000 | PACK | Freq: Once | INTRAVENOUS | Status: AC | PRN
  Administered 2013-11-12: 5.3 via INTRAVENOUS

## 2013-11-13 ENCOUNTER — Other Ambulatory Visit: Payer: Self-pay | Admitting: *Deleted

## 2013-11-13 DIAGNOSIS — R932 Abnormal findings on diagnostic imaging of liver and biliary tract: Secondary | ICD-10-CM

## 2013-11-14 ENCOUNTER — Telehealth: Payer: Self-pay | Admitting: Gastroenterology

## 2013-11-14 NOTE — Telephone Encounter (Signed)
Spoke with patient and she would like for Alonza Bogus, [place] to call her. She is asking if she can be treated medically. She would like to discuss her results.

## 2013-11-14 NOTE — Telephone Encounter (Signed)
There is nothing we can do medically to treat a gallbladder problem.  Gallbladder issues can cause the symptoms that she is having and so far extensive evaluation has found no other cause for her symptoms.  The reason for the surgical consult is to discuss her results with the surgeons because there is nothing else for [place] to do in that regards.  Thank you,  Jess

## 2013-11-14 NOTE — Telephone Encounter (Signed)
Patient notified of recommendations. 

## 2013-11-17 ENCOUNTER — Telehealth: Payer: Self-pay | Admitting: Gastroenterology

## 2013-11-17 DIAGNOSIS — K219 Gastro-esophageal reflux disease without esophagitis: Secondary | ICD-10-CM

## 2013-11-17 NOTE — Telephone Encounter (Signed)
Spoke with patient's mother and she is concerned that patient is having reflux and constipation. She states a GES was mentioned at [place]. She is asking if patient should have this test also. Per Alonza Bogus, PA , patient may have GES and may have rx for Linzess 145 or 290. Scheduled GES at [facility] radiology(Tony) on 12/05/13 at 9:15 AM. NPO after midnight. No stomach medications after midnight.

## 2013-11-17 NOTE — Telephone Encounter (Signed)
Spoke with patient's mother and gave her appointment date, time and instructions for GES. Patient has not tried Linzess samples. She will try it and call back for rx if it works.

## 2013-11-20 ENCOUNTER — Telehealth: Payer: Self-pay | Admitting: Gastroenterology

## 2013-11-20 ENCOUNTER — Other Ambulatory Visit: Payer: Self-pay | Admitting: Gastroenterology

## 2013-11-20 MED ORDER — LINACLOTIDE 145 MCG PO CAPS: 145.0000 ug | ORAL_CAPSULE | Freq: Every day | ORAL | Status: DC

## 2013-11-20 NOTE — Telephone Encounter (Signed)
Left 2 boxes of samples up front Prescription sent to pharmacy Patient notified

## 2013-11-20 NOTE — Telephone Encounter (Signed)
Samples left Patient notified

## 2013-11-26 DIAGNOSIS — K828 Other specified diseases of gallbladder: Secondary | ICD-10-CM

## 2013-11-26 HISTORY — DX: Other specified diseases of gallbladder: K82.8

## 2013-12-02 ENCOUNTER — Ambulatory Visit (INDEPENDENT_AMBULATORY_CARE_PROVIDER_SITE_OTHER): Admitting: General Surgery

## 2013-12-02 ENCOUNTER — Encounter (INDEPENDENT_AMBULATORY_CARE_PROVIDER_SITE_OTHER): Payer: Self-pay | Admitting: General Surgery

## 2013-12-02 VITALS — BP 116/78 | HR 84 | Temp 97.3°F | Ht 67.0 in | Wt 166.0 lb

## 2013-12-02 DIAGNOSIS — K828 Other specified diseases of gallbladder: Secondary | ICD-10-CM | POA: Insufficient documentation

## 2013-12-02 NOTE — Progress Notes (Signed)
Subjective:     Patient ID: Angela [name], female   DOB: [birth year], 18 y.o.   MRN: [id number]  HPI The patient is well known to me. She is status post laparoscopic appendectomy back in May of 2015. She had somewhat of a prolonged postoperative course with some wound problems. Her appendix was only mildly inflamed.  He now has symptoms of nausea and some vomiting and epigastric or right upper quadrant pain. She has a negative ultrasound of the abdomen. A HIDA scan demonstrates biliary dyskinesia with ejection fraction of 23%. Her pain is somewhat constant and not really change much with eating.  Review of Systems Abdominal pain in the right upper quadrant. No fevers chills or jaundice.    Objective:   Physical Exam Her incision sites from her previous laparoscopic appendectomy healed well. The patient has neck and reaction to adhesive tape from the last operation particularly the adhesive on the Tegaderm.  Mildly tender in the right upper quadrant. Positive Murphy sign. Good bowel sounds..    Assessment:     Biliary dyskinesia with possible symptoms related to dysfunctional gallbladder.  Normal ultrasound of the abdomen.     Plan:     October the patient and her mother extensively about the possibility of removing her gallbladder may not ameliorate her symptoms. He understands this but we should go ahead. We'll plan for laparoscopic cholecystectomy in the near future.

## 2013-12-04 ENCOUNTER — Telehealth (INDEPENDENT_AMBULATORY_CARE_PROVIDER_SITE_OTHER): Payer: Self-pay | Admitting: General Surgery

## 2013-12-04 ENCOUNTER — Encounter (HOSPITAL_BASED_OUTPATIENT_CLINIC_OR_DEPARTMENT_OTHER): Payer: Self-pay | Admitting: *Deleted

## 2013-12-04 NOTE — Telephone Encounter (Signed)
Jadeyn Hargett called about a Nuclear Medicine test her daughter Angela [name] was having tomorrow.  Angela [name] is schedule to have a cholecystectomy by Dr. Hulen Skains next week for biliary dyskinesia.  Her PCP ordered a gastric emptying scan on her for tomorrow.  Petra Kuba asked if this test would interfere with her cholecystectomy.  I explained to her that unless the test showed something that was felt to explain her symptoms other than the biliary dyskinesia I did not think this would interfere with the cholecystectomy.

## 2013-12-04 NOTE — Pre-Procedure Instructions (Signed)
Cardiology note reviewed by Dr. Al Corpus - pt. needs to be done at [facility].  Tonya at [facility] notified.  Pt's mother notified that [facility] will be in touch with her to reschedule surgery at [facility].

## 2013-12-04 NOTE — Pre-Procedure Instructions (Signed)
To come for CMET and CBC, diff 

## 2013-12-05 ENCOUNTER — Telehealth: Payer: Self-pay | Admitting: Gastroenterology

## 2013-12-05 ENCOUNTER — Encounter (HOSPITAL_COMMUNITY): Payer: Self-pay

## 2013-12-05 ENCOUNTER — Telehealth: Payer: Self-pay | Admitting: Diagnostic Neuroimaging

## 2013-12-05 ENCOUNTER — Ambulatory Visit (HOSPITAL_COMMUNITY)
Admission: RE | Admit: 2013-12-05 | Discharge: 2013-12-05 | Disposition: A | Discharge status: Home / Self Care | Source: Ambulatory Visit | Attending: Gastroenterology | Admitting: Gastroenterology

## 2013-12-05 ENCOUNTER — Other Ambulatory Visit (INDEPENDENT_AMBULATORY_CARE_PROVIDER_SITE_OTHER): Payer: Self-pay | Admitting: *Deleted

## 2013-12-05 DIAGNOSIS — R109 Unspecified abdominal pain: Secondary | ICD-10-CM | POA: Insufficient documentation

## 2013-12-05 DIAGNOSIS — R110 Nausea: Secondary | ICD-10-CM | POA: Insufficient documentation

## 2013-12-05 DIAGNOSIS — M531 Cervicobrachial syndrome: Secondary | ICD-10-CM

## 2013-12-05 DIAGNOSIS — R1013 Epigastric pain: Secondary | ICD-10-CM

## 2013-12-05 DIAGNOSIS — K219 Gastro-esophageal reflux disease without esophagitis: Secondary | ICD-10-CM

## 2013-12-05 DIAGNOSIS — K3189 Other diseases of stomach and duodenum: Secondary | ICD-10-CM | POA: Insufficient documentation

## 2013-12-05 DIAGNOSIS — G43809 Other migraine, not intractable, without status migrainosus: Secondary | ICD-10-CM

## 2013-12-05 MED ORDER — TECHNETIUM TC 99M SULFUR COLLOID
2.1000 | Freq: Once | INTRAVENOUS | Status: AC | PRN
  Administered 2013-12-05: 2.1 via ORAL

## 2013-12-05 MED ORDER — METOCLOPRAMIDE HCL 10 MG PO TABS
10.0000 mg | ORAL_TABLET | Freq: Three times a day (TID) | ORAL | Status: DC
Start: 2013-12-05 — End: 2013-12-10

## 2013-12-05 NOTE — Telephone Encounter (Signed)
Gerald Stabs with [facility] @ [phone], requesting PT script with date of 4/19-6/30 signed by Dr. Leta Baptist per [employer]'s request.  Please fax to [phone] to her attention.  Please call with any questions.  Thanks

## 2013-12-05 NOTE — Telephone Encounter (Signed)
Left a message for patient to call me. 

## 2013-12-05 NOTE — Telephone Encounter (Signed)
Told patient's mother I cannot release information without ROI and that Alonza Bogus, PA must review before results can be given out. Mother request results be called to her daughter ASAP.

## 2013-12-05 NOTE — Telephone Encounter (Signed)
Spoke with patient and mother and gave them the recommendations. Rx sent. Patient will call her surgeon and discuss how to manage the surgery.

## 2013-12-05 NOTE — Telephone Encounter (Signed)
Gastric emptying scan shows significant delay in gastric emptying. Would begin Reglan 10 mg one half hour a.c. and at bedtime.  Let's delay surgery to see how she responds to Reglan. Office visit about one month.  Instruct patient to  contact me immediately  if she develops any side effects from her Reglan including paresthesias, tremors, confusion , weakness or muscle spasms.

## 2013-12-05 NOTE — Telephone Encounter (Signed)
Patient 's mother states the patient is scheduled for gall bladder removal on 12/09/13 and she needs the results of GES because she may not need the surgery if this test shows something. Alonza Bogus, PA is out of the office. Please, advise.

## 2013-12-08 ENCOUNTER — Telehealth (INDEPENDENT_AMBULATORY_CARE_PROVIDER_SITE_OTHER): Payer: Self-pay

## 2013-12-08 ENCOUNTER — Encounter (HOSPITAL_COMMUNITY): Payer: Self-pay | Admitting: *Deleted

## 2013-12-08 MED ORDER — CIPROFLOXACIN IN D5W 400 MG/200ML IV SOLN
400.0000 mg | INTRAVENOUS | Status: AC
  Administered 2013-12-09: 400 mg via INTRAVENOUS
  Filled 2013-12-08: qty 200

## 2013-12-08 NOTE — Telephone Encounter (Signed)
Patients mother Santiago Glad) calling into office regarding Gastric Emptying results.  Mother states the GI physician informed her on Friday that they need to ask Dr. Hulen Skains to review her results.  Patient scheduled for Laparoscopic Cholecystectomy on 12/09/13.  Mother would like a call back to discuss results and whether or not she needs to proceed with surgery tomorrow.  Mother states she's out of town and lost her Pensions consultant.  Please call [phone] 1st and if it goes to voicemail to call [phone].  Mother advised message will be forwarded to Dr. Hulen Skains and his assistant.

## 2013-12-09 ENCOUNTER — Ambulatory Visit (HOSPITAL_COMMUNITY): Admitting: Anesthesiology

## 2013-12-09 ENCOUNTER — Encounter (HOSPITAL_COMMUNITY): Payer: Self-pay | Admitting: Surgery

## 2013-12-09 ENCOUNTER — Encounter (HOSPITAL_COMMUNITY): Admitting: Anesthesiology

## 2013-12-09 ENCOUNTER — Ambulatory Visit (HOSPITAL_COMMUNITY)

## 2013-12-09 ENCOUNTER — Ambulatory Visit (HOSPITAL_BASED_OUTPATIENT_CLINIC_OR_DEPARTMENT_OTHER): Admit: 2013-12-09 | Payer: Self-pay | Admitting: General Surgery

## 2013-12-09 ENCOUNTER — Ambulatory Visit (HOSPITAL_COMMUNITY)
Admission: RE | Admit: 2013-12-09 | Discharge: 2013-12-10 | Disposition: A | Discharge status: Home / Self Care | Source: Ambulatory Visit | Attending: General Surgery | Admitting: General Surgery

## 2013-12-09 ENCOUNTER — Encounter (HOSPITAL_COMMUNITY)
Admission: RE | Disposition: A | Discharge status: Home / Self Care | Payer: Self-pay | Source: Ambulatory Visit | Attending: General Surgery

## 2013-12-09 ENCOUNTER — Encounter (HOSPITAL_BASED_OUTPATIENT_CLINIC_OR_DEPARTMENT_OTHER): Payer: Self-pay

## 2013-12-09 DIAGNOSIS — K5900 Constipation, unspecified: Secondary | ICD-10-CM | POA: Insufficient documentation

## 2013-12-09 DIAGNOSIS — M5380 Other specified dorsopathies, site unspecified: Secondary | ICD-10-CM | POA: Insufficient documentation

## 2013-12-09 DIAGNOSIS — M25519 Pain in unspecified shoulder: Secondary | ICD-10-CM | POA: Insufficient documentation

## 2013-12-09 DIAGNOSIS — K811 Chronic cholecystitis: Secondary | ICD-10-CM

## 2013-12-09 DIAGNOSIS — K3184 Gastroparesis: Secondary | ICD-10-CM | POA: Insufficient documentation

## 2013-12-09 DIAGNOSIS — Q2571 Coarctation of pulmonary artery: Secondary | ICD-10-CM

## 2013-12-09 DIAGNOSIS — Q255 Atresia of pulmonary artery: Secondary | ICD-10-CM | POA: Insufficient documentation

## 2013-12-09 DIAGNOSIS — K219 Gastro-esophageal reflux disease without esophagitis: Secondary | ICD-10-CM | POA: Insufficient documentation

## 2013-12-09 DIAGNOSIS — K828 Other specified diseases of gallbladder: Secondary | ICD-10-CM

## 2013-12-09 DIAGNOSIS — R011 Cardiac murmur, unspecified: Secondary | ICD-10-CM | POA: Insufficient documentation

## 2013-12-09 HISTORY — DX: Other specified diseases of gallbladder: K82.8

## 2013-12-09 HISTORY — DX: Functional dyspepsia: K30

## 2013-12-09 HISTORY — DX: Other constipation: K59.09

## 2013-12-09 HISTORY — DX: Cluster headache syndrome, unspecified, not intractable: G44.009

## 2013-12-09 HISTORY — DX: Cardiac murmur, unspecified: R01.1

## 2013-12-09 HISTORY — DX: Gastro-esophageal reflux disease without esophagitis: K21.9

## 2013-12-09 HISTORY — PX: CHOLECYSTECTOMY: SHX55

## 2013-12-09 HISTORY — PX: LAPAROSCOPIC CHOLECYSTECTOMY: SUR755

## 2013-12-09 HISTORY — DX: Other muscle spasm: M62.838

## 2013-12-09 HISTORY — DX: Unspecified injury of head, initial encounter: S09.90XA

## 2013-12-09 LAB — CBC WITH DIFFERENTIAL/PLATELET
BASOS ABS: 0 10*3/uL (ref 0.0–0.1)
Basophils Relative: 1 % (ref 0–1)
Eosinophils Absolute: 0.2 10*3/uL (ref 0.0–0.7)
Eosinophils Relative: 4 % (ref 0–5)
HCT: 41.5 % (ref 36.0–46.0)
Hemoglobin: 13.8 g/dL (ref 12.0–15.0)
LYMPHS PCT: 45 % (ref 12–46)
Lymphs Abs: 2.9 10*3/uL (ref 0.7–4.0)
MCH: 29.8 pg (ref 26.0–34.0)
MCHC: 33.3 g/dL (ref 30.0–36.0)
MCV: 89.6 fL (ref 78.0–100.0)
Monocytes Absolute: 0.7 10*3/uL (ref 0.1–1.0)
Monocytes Relative: 11 % (ref 3–12)
NEUTROS ABS: 2.5 10*3/uL (ref 1.7–7.7)
NEUTROS PCT: 39 % — AB (ref 43–77)
PLATELETS: 323 10*3/uL (ref 150–400)
RBC: 4.63 MIL/uL (ref 3.87–5.11)
RDW: 13.3 % (ref 11.5–15.5)
WBC: 6.3 10*3/uL (ref 4.0–10.5)

## 2013-12-09 LAB — COMPREHENSIVE METABOLIC PANEL
ALT: 12 U/L (ref 0–35)
AST: 14 U/L (ref 0–37)
Albumin: 4.2 g/dL (ref 3.5–5.2)
Alkaline Phosphatase: 87 U/L (ref 39–117)
Anion gap: 14 (ref 5–15)
BUN: 8 mg/dL (ref 6–23)
CO2: 25 meq/L (ref 19–32)
Calcium: 10.2 mg/dL (ref 8.4–10.5)
Chloride: 104 mEq/L (ref 96–112)
Creatinine, Ser: 0.81 mg/dL (ref 0.50–1.10)
GFR calc Af Amer: >90 mL/min (ref >90)
GFR calc non Af Amer: >90 mL/min (ref >90)
Glucose, Bld: 95 mg/dL (ref 70–99)
Potassium: 4.1 mEq/L (ref 3.7–5.3)
SODIUM: 143 meq/L (ref 137–147)
Total Bilirubin: 0.5 mg/dL (ref 0.3–1.2)
Total Protein: 7.4 g/dL (ref 6.0–8.3)

## 2013-12-09 LAB — HCG, SERUM, QUALITATIVE: Preg, Serum: NEGATIVE

## 2013-12-09 SURGERY — LAPAROSCOPIC CHOLECYSTECTOMY WITH INTRAOPERATIVE CHOLANGIOGRAM
Anesthesia: General

## 2013-12-09 SURGERY — LAPAROSCOPIC CHOLECYSTECTOMY WITH INTRAOPERATIVE CHOLANGIOGRAM
Anesthesia: General | Site: Abdomen

## 2013-12-09 MED ORDER — HYDROMORPHONE HCL PF 1 MG/ML IJ SOLN
0.5000 mg | INTRAMUSCULAR | Status: DC | PRN
  Administered 2013-12-09: 0.5 mg via INTRAVENOUS
  Administered 2013-12-09: 1 mg via INTRAVENOUS
  Filled 2013-12-09 (×2): qty 1

## 2013-12-09 MED ORDER — FENTANYL CITRATE 0.05 MG/ML IJ SOLN
INTRAMUSCULAR | Status: AC
  Filled 2013-12-09: qty 5

## 2013-12-09 MED ORDER — PROPOFOL 10 MG/ML IV BOLUS
INTRAVENOUS | Status: AC
  Filled 2013-12-09: qty 20

## 2013-12-09 MED ORDER — FENTANYL CITRATE 0.05 MG/ML IJ SOLN
INTRAMUSCULAR | Status: DC | PRN
  Administered 2013-12-09 (×3): 50 ug via INTRAVENOUS
  Administered 2013-12-09: 100 ug via INTRAVENOUS
  Administered 2013-12-09 (×3): 50 ug via INTRAVENOUS
  Administered 2013-12-09: 100 ug via INTRAVENOUS

## 2013-12-09 MED ORDER — ROCURONIUM BROMIDE 50 MG/5ML IV SOLN
INTRAVENOUS | Status: AC
  Filled 2013-12-09: qty 1

## 2013-12-09 MED ORDER — HYDROMORPHONE HCL PF 1 MG/ML IJ SOLN
0.2500 mg | INTRAMUSCULAR | Status: DC | PRN
  Administered 2013-12-09 (×2): 0.25 mg via INTRAVENOUS
  Administered 2013-12-09 (×2): 0.5 mg via INTRAVENOUS
  Administered 2013-12-09 (×2): 0.25 mg via INTRAVENOUS

## 2013-12-09 MED ORDER — ONDANSETRON HCL 4 MG/2ML IJ SOLN
INTRAMUSCULAR | Status: DC | PRN
  Administered 2013-12-09: 4 mg via INTRAVENOUS

## 2013-12-09 MED ORDER — LORATADINE 10 MG PO TABS
10.0000 mg | ORAL_TABLET | Freq: Every day | ORAL | Status: DC
  Administered 2013-12-10: 10 mg via ORAL
  Filled 2013-12-09: qty 1

## 2013-12-09 MED ORDER — OXYCODONE HCL 5 MG PO TABS: 5.0000 mg | ORAL_TABLET | Freq: Once | ORAL | Status: DC | PRN

## 2013-12-09 MED ORDER — DEXAMETHASONE SODIUM PHOSPHATE 10 MG/ML IJ SOLN
INTRAMUSCULAR | Status: AC
  Filled 2013-12-09: qty 1

## 2013-12-09 MED ORDER — OXYCODONE HCL 5 MG/5ML PO SOLN: 5.0000 mg | Freq: Once | ORAL | Status: DC | PRN

## 2013-12-09 MED ORDER — LORAZEPAM 2 MG/ML IJ SOLN
1.0000 mg | Freq: Once | INTRAMUSCULAR | Status: AC
  Administered 2013-12-09: 1 mg via INTRAVENOUS
  Filled 2013-12-09: qty 1

## 2013-12-09 MED ORDER — BUPIVACAINE-EPINEPHRINE 0.25% -1:200000 IJ SOLN
INTRAMUSCULAR | Status: DC | PRN
  Administered 2013-12-09: 20 mL

## 2013-12-09 MED ORDER — MIDAZOLAM HCL 2 MG/2ML IJ SOLN
INTRAMUSCULAR | Status: AC
  Filled 2013-12-09: qty 2

## 2013-12-09 MED ORDER — LINACLOTIDE 145 MCG PO CAPS
145.0000 ug | ORAL_CAPSULE | Freq: Every day | ORAL | Status: DC
  Administered 2013-12-10: 145 ug via ORAL
  Filled 2013-12-09 (×2): qty 1

## 2013-12-09 MED ORDER — ONDANSETRON HCL 4 MG/2ML IJ SOLN
INTRAMUSCULAR | Status: AC
  Filled 2013-12-09: qty 2

## 2013-12-09 MED ORDER — HYDROCODONE-ACETAMINOPHEN 5-325 MG PO TABS: 1.0000 | ORAL_TABLET | ORAL | Status: DC | PRN

## 2013-12-09 MED ORDER — METOCLOPRAMIDE HCL 5 MG/ML IJ SOLN
10.0000 mg | Freq: Once | INTRAMUSCULAR | Status: DC
  Filled 2013-12-09: qty 2

## 2013-12-09 MED ORDER — HYDROCODONE-ACETAMINOPHEN 5-325 MG PO TABS
1.0000 | ORAL_TABLET | ORAL | Status: DC | PRN
  Administered 2013-12-09: 1 via ORAL
  Administered 2013-12-10 (×2): 2 via ORAL
  Filled 2013-12-09 (×2): qty 2
  Filled 2013-12-09: qty 1

## 2013-12-09 MED ORDER — ONDANSETRON HCL 4 MG PO TABS: 4.0000 mg | ORAL_TABLET | Freq: Four times a day (QID) | ORAL | Status: DC | PRN

## 2013-12-09 MED ORDER — MIDAZOLAM HCL 2 MG/2ML IJ SOLN
INTRAMUSCULAR | Status: AC
  Administered 2013-12-09: 1 mg
  Filled 2013-12-09: qty 2

## 2013-12-09 MED ORDER — ONDANSETRON HCL 4 MG/2ML IJ SOLN
4.0000 mg | Freq: Once | INTRAMUSCULAR | Status: AC | PRN
  Administered 2013-12-09: 4 mg via INTRAVENOUS

## 2013-12-09 MED ORDER — ONDANSETRON HCL 4 MG/2ML IJ SOLN
INTRAMUSCULAR | Status: AC
Start: 2013-12-09 — End: 2013-12-10
  Filled 2013-12-09: qty 2

## 2013-12-09 MED ORDER — NEOSTIGMINE METHYLSULFATE 10 MG/10ML IV SOLN
INTRAVENOUS | Status: DC | PRN
  Administered 2013-12-09: 1 mg via INTRAVENOUS
  Administered 2013-12-09: 3 mg via INTRAVENOUS

## 2013-12-09 MED ORDER — LACTATED RINGERS IV SOLN
INTRAVENOUS | Status: DC | PRN
  Administered 2013-12-09 (×2): via INTRAVENOUS

## 2013-12-09 MED ORDER — MIDAZOLAM HCL 5 MG/5ML IJ SOLN
INTRAMUSCULAR | Status: DC | PRN
  Administered 2013-12-09 (×2): 1 mg via INTRAVENOUS

## 2013-12-09 MED ORDER — ENOXAPARIN SODIUM 40 MG/0.4ML ~~LOC~~ SOLN
40.0000 mg | SUBCUTANEOUS | Status: DC
  Administered 2013-12-10: 40 mg via SUBCUTANEOUS
  Filled 2013-12-09: qty 0.4

## 2013-12-09 MED ORDER — KETOROLAC TROMETHAMINE 30 MG/ML IJ SOLN
30.0000 mg | Freq: Four times a day (QID) | INTRAMUSCULAR | Status: DC
  Administered 2013-12-09 – 2013-12-10 (×3): 30 mg via INTRAVENOUS
  Filled 2013-12-09 (×6): qty 1

## 2013-12-09 MED ORDER — BUPIVACAINE-EPINEPHRINE (PF) 0.25% -1:200000 IJ SOLN
INTRAMUSCULAR | Status: AC
  Filled 2013-12-09: qty 30

## 2013-12-09 MED ORDER — SODIUM CHLORIDE 0.9 % IR SOLN
Status: DC | PRN
  Administered 2013-12-09: 1

## 2013-12-09 MED ORDER — HYDROMORPHONE HCL PF 1 MG/ML IJ SOLN
INTRAMUSCULAR | Status: AC
  Filled 2013-12-09: qty 1

## 2013-12-09 MED ORDER — GLYCOPYRROLATE 0.2 MG/ML IJ SOLN
INTRAMUSCULAR | Status: AC
  Filled 2013-12-09: qty 2

## 2013-12-09 MED ORDER — HYDROMORPHONE HCL PF 1 MG/ML IJ SOLN
0.5000 mg | INTRAMUSCULAR | Status: DC | PRN
  Administered 2013-12-09 – 2013-12-10 (×3): 1 mg via INTRAVENOUS
  Filled 2013-12-09 (×3): qty 1

## 2013-12-09 MED ORDER — LIDOCAINE HCL (CARDIAC) 20 MG/ML IV SOLN
INTRAVENOUS | Status: AC
  Filled 2013-12-09: qty 5

## 2013-12-09 MED ORDER — SUCCINYLCHOLINE CHLORIDE 20 MG/ML IJ SOLN
INTRAMUSCULAR | Status: AC
  Filled 2013-12-09: qty 1

## 2013-12-09 MED ORDER — DEXAMETHASONE SODIUM PHOSPHATE 10 MG/ML IJ SOLN
INTRAMUSCULAR | Status: DC | PRN
  Administered 2013-12-09: 10 mg via INTRAVENOUS

## 2013-12-09 MED ORDER — LIDOCAINE HCL (CARDIAC) 20 MG/ML IV SOLN
INTRAVENOUS | Status: DC | PRN
  Administered 2013-12-09: 50 mg via INTRAVENOUS

## 2013-12-09 MED ORDER — GLYCOPYRROLATE 0.2 MG/ML IJ SOLN
INTRAMUSCULAR | Status: DC | PRN
  Administered 2013-12-09: 0.4 mg via INTRAVENOUS
  Administered 2013-12-09: 0.2 mg via INTRAVENOUS

## 2013-12-09 MED ORDER — HYDROMORPHONE HCL PF 1 MG/ML IJ SOLN
INTRAMUSCULAR | Status: AC
  Administered 2013-12-09: 0.25 mg via INTRAVENOUS
  Filled 2013-12-09: qty 1

## 2013-12-09 MED ORDER — 0.9 % SODIUM CHLORIDE (POUR BTL) OPTIME
TOPICAL | Status: DC | PRN
  Administered 2013-12-09: 1000 mL

## 2013-12-09 MED ORDER — KCL IN DEXTROSE-NACL 20-5-0.45 MEQ/L-%-% IV SOLN
INTRAVENOUS | Status: DC
  Administered 2013-12-09 – 2013-12-10 (×2): via INTRAVENOUS
  Filled 2013-12-09 (×3): qty 1000

## 2013-12-09 MED ORDER — ROCURONIUM BROMIDE 100 MG/10ML IV SOLN
INTRAVENOUS | Status: DC | PRN
  Administered 2013-12-09: 20 mg via INTRAVENOUS

## 2013-12-09 MED ORDER — LACTATED RINGERS IV SOLN
INTRAVENOUS | Status: DC
  Administered 2013-12-09: 08:00:00 via INTRAVENOUS

## 2013-12-09 MED ORDER — CHLORHEXIDINE GLUCONATE 4 % EX LIQD
1.0000 "application " | Freq: Once | CUTANEOUS | Status: DC
  Filled 2013-12-09: qty 15

## 2013-12-09 MED ORDER — SUCCINYLCHOLINE CHLORIDE 20 MG/ML IJ SOLN
INTRAMUSCULAR | Status: DC | PRN
  Administered 2013-12-09: 100 mg via INTRAVENOUS

## 2013-12-09 MED ORDER — PROPOFOL 10 MG/ML IV BOLUS
INTRAVENOUS | Status: DC | PRN
  Administered 2013-12-09 (×2): 10 mg via INTRAVENOUS
  Administered 2013-12-09: 120 mg via INTRAVENOUS
  Administered 2013-12-09: 20 mg via INTRAVENOUS

## 2013-12-09 MED ORDER — ONDANSETRON HCL 4 MG/2ML IJ SOLN: 4.0000 mg | Freq: Four times a day (QID) | INTRAMUSCULAR | Status: DC | PRN

## 2013-12-09 MED ORDER — NEOSTIGMINE METHYLSULFATE 10 MG/10ML IV SOLN
INTRAVENOUS | Status: AC
  Filled 2013-12-09: qty 1

## 2013-12-09 MED ORDER — SUMATRIPTAN SUCCINATE 50 MG PO TABS
50.0000 mg | ORAL_TABLET | ORAL | Status: DC | PRN
  Filled 2013-12-09: qty 1

## 2013-12-09 MED ORDER — SODIUM CHLORIDE 0.9 % IV SOLN
INTRAVENOUS | Status: DC | PRN
  Administered 2013-12-09: 11:00:00

## 2013-12-09 SURGICAL SUPPLY — 44 items
APPLIER CLIP 5 13 M/L LIGAMAX5 (MISCELLANEOUS)
APPLIER CLIP ROT 10 11.4 M/L (STAPLE)
BLADE SURG ROTATE 9660 (MISCELLANEOUS) IMPLANT
CANISTER SUCTION 2500CC (MISCELLANEOUS) ×2 IMPLANT
CHLORAPREP W/TINT 26ML (MISCELLANEOUS) ×2 IMPLANT
CLIP APPLIE 5 13 M/L LIGAMAX5 (MISCELLANEOUS) IMPLANT
CLIP APPLIE ROT 10 11.4 M/L (STAPLE) IMPLANT
COVER MAYO STAND STRL (DRAPES) ×2 IMPLANT
COVER SURGICAL LIGHT HANDLE (MISCELLANEOUS) ×2 IMPLANT
DERMABOND ADVANCED (GAUZE/BANDAGES/DRESSINGS) ×1
DERMABOND ADVANCED .7 DNX12 (GAUZE/BANDAGES/DRESSINGS) ×1 IMPLANT
DRAPE C-ARM 42X72 X-RAY (DRAPES) ×2 IMPLANT
DRAPE UTILITY 15X26 W/TAPE STR (DRAPE) ×4 IMPLANT
DRSG TEGADERM 2-3/8X2-3/4 SM (GAUZE/BANDAGES/DRESSINGS) ×8 IMPLANT
ELECT REM PT RETURN 9FT ADLT (ELECTROSURGICAL) ×2
ELECTRODE REM PT RTRN 9FT ADLT (ELECTROSURGICAL) ×1 IMPLANT
GLOVE BIO SURGEON STRL SZ7.5 (GLOVE) ×2 IMPLANT
GLOVE BIOGEL PI IND STRL 7.0 (GLOVE) ×2 IMPLANT
GLOVE BIOGEL PI IND STRL 7.5 (GLOVE) ×1 IMPLANT
GLOVE BIOGEL PI IND STRL 8 (GLOVE) ×1 IMPLANT
GLOVE BIOGEL PI INDICATOR 7.0 (GLOVE) ×2
GLOVE BIOGEL PI INDICATOR 7.5 (GLOVE) ×1
GLOVE BIOGEL PI INDICATOR 8 (GLOVE) ×1
GLOVE ECLIPSE 7.5 STRL STRAW (GLOVE) ×2 IMPLANT
GLOVE SURG SS PI 7.0 STRL IVOR (GLOVE) ×4 IMPLANT
GOWN STRL REUS W/ TWL LRG LVL3 (GOWN DISPOSABLE) ×3 IMPLANT
GOWN STRL REUS W/TWL LRG LVL3 (GOWN DISPOSABLE) ×3
KIT BASIN OR (CUSTOM PROCEDURE TRAY) ×2 IMPLANT
KIT ROOM TURNOVER OR (KITS) ×2 IMPLANT
NS IRRIG 1000ML POUR BTL (IV SOLUTION) ×2 IMPLANT
PAD ARMBOARD 7.5X6 YLW CONV (MISCELLANEOUS) ×2 IMPLANT
POUCH SPECIMEN RETRIEVAL 10MM (ENDOMECHANICALS) IMPLANT
SCISSORS LAP 5X35 DISP (ENDOMECHANICALS) ×2 IMPLANT
SET CHOLANGIOGRAPH 5 50 .035 (SET/KITS/TRAYS/PACK) ×2 IMPLANT
SET IRRIG TUBING LAPAROSCOPIC (IRRIGATION / IRRIGATOR) ×2 IMPLANT
SLEEVE ENDOPATH XCEL 5M (ENDOMECHANICALS) ×2 IMPLANT
SPECIMEN JAR SMALL (MISCELLANEOUS) ×2 IMPLANT
SUT MNCRL AB 4-0 PS2 18 (SUTURE) ×2 IMPLANT
TOWEL OR 17X24 6PK STRL BLUE (TOWEL DISPOSABLE) ×2 IMPLANT
TOWEL OR 17X26 10 PK STRL BLUE (TOWEL DISPOSABLE) ×2 IMPLANT
TRAY LAPAROSCOPIC (CUSTOM PROCEDURE TRAY) ×2 IMPLANT
TROCAR XCEL BLUNT TIP 100MML (ENDOMECHANICALS) ×2 IMPLANT
TROCAR XCEL NON-BLD 11X100MML (ENDOMECHANICALS) IMPLANT
TROCAR XCEL NON-BLD 5MMX100MML (ENDOMECHANICALS) ×2 IMPLANT

## 2013-12-09 NOTE — Transfer of Care (Signed)
Immediate Anesthesia Transfer of Care Note  Patient: Angela [name]  Procedure(s) Performed: Procedure(s): LAPAROSCOPIC CHOLECYSTECTOMY WITH INTRAOPERATIVE CHOLANGIOGRAM (N/A)  Patient Location: PACU  Anesthesia Type:General  Level of Consciousness: responds to stimulation  Airway & Oxygen Therapy: Patient Spontanous Breathing and Patient connected to nasal cannula oxygen  Post-op Assessment: Report given to PACU RN, Post -op Vital signs reviewed and stable and Patient moving all extremities X 4  Post vital signs: Reviewed and stable  Complications: No apparent anesthesia complications

## 2013-12-09 NOTE — Op Note (Addendum)
OPERATIVE REPORT  DATE OF OPERATION: 12/09/2013  PATIENT:  Angela [name]  18 y.o. female  PRE-OPERATIVE DIAGNOSIS:  Symptomatic biliary dyskinesia  POST-OPERATIVE DIAGNOSIS:  Symptomatic biliary dyskinesia, with evidence of chronic cholecystitis  PROCEDURE:  Procedure(s): LAPAROSCOPIC CHOLECYSTECTOMY WITH INTRAOPERATIVE CHOLANGIOGRAM  SURGEON:  Surgeon(s): Gwenyth Ober, MD Odis Hollingshead, MD  ASSISTANT: Zella Richer  ANESTHESIA:   general  EBL: <20 ml  BLOOD ADMINISTERED: none  DRAINS: none   SPECIMEN:  Source of Specimen:  Gallbladder  COUNTS CORRECT:  YES  PROCEDURE DETAILS: The patient was taken to the operating room and placed on the table in the supine position.  After an adequate endotracheal anesthetic was administered, the patient was prepped with ChloroPrep, and then draped in the usual manner exposing the entire abdomen laterally, inferiorly and up  to the costal margins.  After a proper timeout was performed including identifying the patient and the procedure to be performed, a infraumbilical 8.7FI midline incision was made using a #15 blade.  This was taken down to the fascia which was then incised with a #15 blade.  The edges of the fascia were tented up with Kocher clamps as the preperitoneal space was penetrated with a Kelly clamp into the peritoneum.  Once this was done, a pursestring suture of 0 Vicryl was passed around the fascial opening.  This was subsequently used to secure the [facility] cannula which was passed into the peritoneal cavity.  Once the [facility] cannula was in place, carbon dioxide gas was insufflated into the peritoneal cavity up to a maximal intra-abdominal pressure of 54mm Hg.The laparoscope, with attached camera and light source, was passed into the peritoneal cavity to visualize the direct insertion of two right upper quadrant 40mm cannulas, and a sup-xiphoid 87mm cannula.  Once all cannulas were in place, the dissection was begun.  There were  significant adhesions of the omentum to the capsule of the liver. There was evidence of chronic cholecystitis. The cholangiogram was normal.  Two ratcheted graspers were attached to the dome and infundibulum of the gallbladder and retracted towards the anterior abdominal wall and the right upper quadrant.  Using cautery attached to a dissecting forceps, the peritoneum overlaying the triangle of Chalot and the hepatoduodenal triangle was dissected away exposing the cystic duct and the cystic artery.  The cystic artery was clipped proximally and distally then transected.  A clip was placed on the gallbladder side of the cystic duct, then a cholecystodochotomy made using the laparoscopic scissors.  Through the cholecystodochotomy a Cook catheter was passed to performed a cholangiogram.  The cholangiogram showed Good proximal filling, no intraductal filling defects, good flow into the duodenum, and no ductal dilatation..  Once the cholangiogram was completed, the [facility] catheter was removed, and the distal cystic duct was clipped multiple times then transected.  The gallbladder was then dissected out of the hepatic bed without event.  It was retrieved from the abdomen (using an EndoCatch bag) without event.  Once the gallbladder was removed, the bed was inspected for hemostasis.  Once excellent hemostasis was obtained all gas and fluids were aspirated from above the liver, then the cannulas were removed.  The infraumbilical incision was closed using the pursestring suture which was in place.  0.25% bupivicaine with epinephrine was injected at all sites.  All 88mm or greater cannula sites were close using a running subcuticular stitch of 4-0 Monocryl.  5.72mm cannula sites were closed with Dermabond only.Steri-Strips and Tagaderm were used to complete the dressings at  all sites.  At this point all needle, sponge, and instrument counts were correct.The patient was awakened from anesthesia and taken to the PACU in  stable condition.      PATIENT DISPOSITION:  PACU - hemodynamically stable.   Gwenyth Ober 7/14/201510:57 AM

## 2013-12-09 NOTE — Interval H&P Note (Signed)
History and Physical Interval Note: Continues to be symptomatic, but unsure if all symptoms coming from GB.  Has Gastroparesis also.  Discussed in detail with the family who wish to proceed with surgery. 12/09/2013 9:26 AM  Angela [name]  has presented today for surgery, with the diagnosis of Symptomatic biliary dyskinesia  The various methods of treatment have been discussed with the patient and family. After consideration of risks, benefits and other options for treatment, the patient has consented to  Procedure(s): LAPAROSCOPIC CHOLECYSTECTOMY WITH INTRAOPERATIVE CHOLANGIOGRAM (N/A) as a surgical intervention .  The patient's history has been reviewed, patient examined, no change in status, stable for surgery.  I have reviewed the patient's chart and labs.  Questions were answered to the patient's satisfaction.     [name], Kathryne Eriksson

## 2013-12-09 NOTE — Progress Notes (Signed)
Speak with MD on-call about pt's family request to have MD come and see pt. Pt symptoms include right upper quad pain that is throbbing. Vital=98.7,99/2L,HR=112,BP=128/88, RR=23. Gave 1mg  YOVZCHYI@ 2238. Pt stated that the pain med gave her no relief.

## 2013-12-09 NOTE — Progress Notes (Signed)
Patient ID: Angela [name], female   DOB: November 20, 1995, 18 y.o.   MRN: [id number]   Nurse called tonight stating that patient was complaining of severe RUQ abdominal pain, radiating up into her chest.  Vitals were WNL.  I adjusted the pain medication frequency and added Toradol.  The patient's mother insisted that I drive into the hospital to evaluate the patient, despite the fact that there was nothing that the nurse could tell me that would indicate any problems.  Filed Vitals:   12/09/13 2241  BP: 128/88  Pulse: 112  Temp: 98.7 F (37.1 C)  Resp: 23   Patient is quite agitated, moaning. She indicates pain in her RUQ, right around her laparoscopic incisions. Incisions c/d/i + Bowel sounds Abd - minimally distended Lungs CTA B  It does not appear that there is anything unusual going on at this time.  The patient's pain tolerance appears to be low, and I explained that the right-sided pain, radiating up to her shoulder, was common after laparoscopic surgery.  I will give her a one time dose of Ativan to try to get her to relax, and we will check labs to make sure there are no significant changes and to appease the patient's mother.  She can also have a heating pad to lay on her abdomen.  Imogene Burn. Georgette Dover, MD, [facility]  General/ Trauma Surgery  12/09/2013 11:20 PM

## 2013-12-09 NOTE — Progress Notes (Signed)
Nurse in to administer Reglan and Gerald Stabs, CRNA is at bedside and is preparing to take patient to OR. Patients mother then stated that Reglan has been causing patient diarrhea and she was going to talk with a physician to see if something else for nausea could be ordered for patient to take instead of Reglan. Gerald Stabs, CRNA requested that Reglan be held and she stated she would take care of patients nausea. Will waste medication as Nurse had already drawn it up in syringe.

## 2013-12-09 NOTE — Progress Notes (Signed)
Pt states she still feels "kind of horrible" and does not want to go home today. She is scared that she will start hurting really bad later. Dr.Wyatt notified and states he will put in admission orders for over night obs.

## 2013-12-09 NOTE — Discharge Instructions (Signed)

## 2013-12-09 NOTE — Progress Notes (Signed)
Going to transport. Enid Derry RN to listen for pt

## 2013-12-09 NOTE — Progress Notes (Signed)
Nurse Tech informed Nurse that patient needed to see me. Nurse in to see patient and patient voiced complaint of nausea and stated "I didn't take my reglan this morning and I am very nauseated, could you give me something for nausea?" Nurse called Dr. Al Corpus and he ordered for patient to have 10 mg Reglan IV. Will enter orders and administer.

## 2013-12-09 NOTE — Anesthesia Preprocedure Evaluation (Signed)
Anesthesia Evaluation  Patient identified by MRN, date of birth, ID band Patient awake    Reviewed: Allergy & Precautions, H&P   Airway Mallampati: I TM Distance: >3 FB Neck ROM: Full    Dental  (+) Teeth Intact, Dental Advisory Given   Pulmonary  breath sounds clear to auscultation        Cardiovascular Rhythm:Regular Rate:Normal     Neuro/Psych    GI/Hepatic GERD-  Medicated and Controlled,  Endo/Other    Renal/GU      Musculoskeletal   Abdominal   Peds  Hematology   Anesthesia Other Findings   Reproductive/Obstetrics                           Anesthesia Physical Anesthesia Plan  ASA: II  Anesthesia Plan: General   Post-op Pain Management:    Induction: Intravenous  Airway Management Planned: Oral ETT  Additional Equipment:   Intra-op Plan:   Post-operative Plan: Extubation in OR  Informed Consent: I have reviewed the patients History and Physical, chart, labs and discussed the procedure including the risks, benefits and alternatives for the proposed anesthesia with the patient or authorized representative who has indicated his/her understanding and acceptance.   Dental advisory given  Plan Discussed with: CRNA, Anesthesiologist and Surgeon  Anesthesia Plan Comments:         Anesthesia Quick Evaluation

## 2013-12-09 NOTE — Telephone Encounter (Signed)
Yes, go ahead with new order. -VRP

## 2013-12-09 NOTE — Telephone Encounter (Signed)
DONE. ssy

## 2013-12-09 NOTE — H&P (View-Only) (Signed)
Subjective:     Patient ID: Angela [name], female   DOB: February 20, 1996, 18 y.o.   MRN: [id number]  HPI The patient is well known to me. She is status post laparoscopic appendectomy back in May of 2015. She had somewhat of a prolonged postoperative course with some wound problems. Her appendix was only mildly inflamed.  He now has symptoms of nausea and some vomiting and epigastric or right upper quadrant pain. She has a negative ultrasound of the abdomen. A HIDA scan demonstrates biliary dyskinesia with ejection fraction of 23%. Her pain is somewhat constant and not really change much with eating.  Review of Systems Abdominal pain in the right upper quadrant. No fevers chills or jaundice.    Objective:   Physical Exam Her incision sites from her previous laparoscopic appendectomy healed well. The patient has neck and reaction to adhesive tape from the last operation particularly the adhesive on the Tegaderm.  Mildly tender in the right upper quadrant. Positive Murphy sign. Good bowel sounds..    Assessment:     Biliary dyskinesia with possible symptoms related to dysfunctional gallbladder.  Normal ultrasound of the abdomen.     Plan:     October the patient and her mother extensively about the possibility of removing her gallbladder may not ameliorate her symptoms. He understands this but we should go ahead. We'll plan for laparoscopic cholecystectomy in the near future.

## 2013-12-09 NOTE — Anesthesia Postprocedure Evaluation (Signed)
  Anesthesia Post-op Note  Patient: Angela [name]  Procedure(s) Performed: Procedure(s): LAPAROSCOPIC CHOLECYSTECTOMY WITH INTRAOPERATIVE CHOLANGIOGRAM (N/A)  Patient Location: PACU  Anesthesia Type:General  Level of Consciousness: awake and alert   Airway and Oxygen Therapy: Patient Spontanous Breathing  Post-op Pain: mild  Post-op Assessment: Post-op Vital signs reviewed, Patient's Cardiovascular Status Stable and Respiratory Function Stable  Post-op Vital Signs: Reviewed  Filed Vitals:   12/09/13 1155  BP: 114/63  Pulse: 65  Temp:   Resp: 10    Complications: No apparent anesthesia complications

## 2013-12-10 DIAGNOSIS — K3184 Gastroparesis: Secondary | ICD-10-CM

## 2013-12-10 DIAGNOSIS — K828 Other specified diseases of gallbladder: Secondary | ICD-10-CM

## 2013-12-10 DIAGNOSIS — K811 Chronic cholecystitis: Secondary | ICD-10-CM

## 2013-12-10 LAB — COMPREHENSIVE METABOLIC PANEL
ALBUMIN: 3.3 g/dL — AB (ref 3.5–5.2)
ALK PHOS: 72 U/L (ref 39–117)
ALT: 37 U/L — ABNORMAL HIGH (ref 0–35)
AST: 26 U/L (ref 0–37)
Anion gap: 15 (ref 5–15)
BILIRUBIN TOTAL: 0.5 mg/dL (ref 0.3–1.2)
BUN: 9 mg/dL (ref 6–23)
CHLORIDE: 100 meq/L (ref 96–112)
CO2: 23 meq/L (ref 19–32)
CREATININE: 0.82 mg/dL (ref 0.50–1.10)
Calcium: 8.6 mg/dL (ref 8.4–10.5)
GFR calc Af Amer: >90 mL/min (ref >90)
Glucose, Bld: 125 mg/dL — ABNORMAL HIGH (ref 70–99)
POTASSIUM: 3.5 meq/L — AB (ref 3.7–5.3)
Sodium: 138 mEq/L (ref 137–147)
Total Protein: 5.8 g/dL — ABNORMAL LOW (ref 6.0–8.3)

## 2013-12-10 LAB — CBC
HCT: 33.5 % — ABNORMAL LOW (ref 36.0–46.0)
Hemoglobin: 11.1 g/dL — ABNORMAL LOW (ref 12.0–15.0)
MCH: 29.3 pg (ref 26.0–34.0)
MCHC: 33.1 g/dL (ref 30.0–36.0)
MCV: 88.4 fL (ref 78.0–100.0)
Platelets: 303 10*3/uL (ref 150–400)
RBC: 3.79 MIL/uL — AB (ref 3.87–5.11)
RDW: 12.9 % (ref 11.5–15.5)
WBC: 12.5 10*3/uL — AB (ref 4.0–10.5)

## 2013-12-10 MED ORDER — ONDANSETRON HCL 4 MG PO TABS: 4.0000 mg | ORAL_TABLET | Freq: Four times a day (QID) | ORAL | Status: DC | PRN

## 2013-12-10 MED ORDER — HYDROCODONE-ACETAMINOPHEN 5-325 MG PO TABS: 1.0000 | ORAL_TABLET | ORAL | Status: DC | PRN

## 2013-12-10 MED ORDER — PANTOPRAZOLE SODIUM 40 MG PO TBEC
40.0000 mg | DELAYED_RELEASE_TABLET | Freq: Every day | ORAL | Status: DC
  Administered 2013-12-10: 40 mg via ORAL
  Filled 2013-12-10: qty 1

## 2013-12-10 MED ORDER — LORAZEPAM 1 MG PO TABS: 1.0000 mg | ORAL_TABLET | Freq: Two times a day (BID) | ORAL | Status: DC | PRN

## 2013-12-10 NOTE — Progress Notes (Signed)
Discussed discharge summary with patient. Patient received Rx. Reviewed all medications with patient. Patient mother at bedside. All questions answered by patient and patient mother. Patient ready for discharge.

## 2013-12-10 NOTE — Discharge Summary (Signed)
Physician Discharge Summary  Patient ID: MILKA WINDHOLZ MRN: [id number] DOB/AGE: 15-Dec-1995 18 y.o.  Admit date: 12/09/2013 Discharge date: 12/10/2013  Admission Diagnoses:  Discharge Diagnoses:  Active Problems:   Cholecystitis, chronic   Gastroparesis   Discharged Condition: good  Hospital Course: Admitted from the PACU with generalized discomfort, and not feeling safe for home.  Developed an acute episode of severe RLQ and right shoulder pain requiring the on call surgeon to come in to see the patient.  Likely subdiaphragmatic gas and abdominal wall muscle spasm.  Labs were drawn and hemoglobin had dropped to 11.1.  Platelets were okay.  Vital signs are good.  Consults: GI  Significant Diagnostic Studies: labs: cbc and Cmet  Treatments: IV hydration, analgesia: Vicodin and Dilaudid and surgery: Lap chole  Discharge Exam: Blood pressure 96/47, pulse 70, temperature 98.2 F (36.8 C), temperature source Oral, resp. rate 15, height 5\' 7"  (1.702 m), weight 78.7 kg (173 lb 8 oz), last menstrual period 12/02/2013, SpO2 100.00%. General appearance: alert, cooperative and no distress GI: soft, non-tender; bowel sounds normal; no masses,  no organomegaly and erythema around incisions, reactive to Dermabond or Monocryl  Disposition: 01-Home or Self Care  Discharge Instructions   Call MD for:  difficulty breathing, headache or visual disturbances    Complete by:  As directed      Call MD for:  extreme fatigue    Complete by:  As directed      Call MD for:  hives    Complete by:  As directed      Call MD for:  persistant dizziness or light-headedness    Complete by:  As directed      Call MD for:  persistant nausea and vomiting    Complete by:  As directed      Call MD for:  redness, tenderness, or signs of infection (pain, swelling, redness, odor or green/yellow discharge around incision site)    Complete by:  As directed      Call MD for:  severe uncontrolled pain    Complete by:   As directed      Call MD for:  temperature >100.4    Complete by:  As directed      Diet general    Complete by:  As directed      Driving Restrictions    Complete by:  As directed   3 days or until pain is well controlled with pain medications     Increase activity slowly    Complete by:  As directed      Leave dressing on - Keep it clean, dry, and intact until clinic visit    Complete by:  As directed      Lifting restrictions    Complete by:  As directed   No lifting > 20 pounds for the next 3 weeks.            Medication List    STOP taking these medications       dexlansoprazole 60 MG capsule  Commonly known as:  DEXILANT     metoCLOPramide 10 MG tablet  Commonly known as:  REGLAN      TAKE these medications       cetirizine 10 MG tablet  Commonly known as:  ZYRTEC  Take 10 mg by mouth at bedtime.     HYDROcodone-acetaminophen 5-325 MG per tablet  Commonly known as:  NORCO/VICODIN  Take 1-2 tablets by mouth every 4 (four) hours as needed for  moderate pain or severe pain.     HYDROcodone-acetaminophen 5-325 MG per tablet  Commonly known as:  NORCO/VICODIN  Take 1-2 tablets by mouth every 4 (four) hours as needed for moderate pain.     Linaclotide 145 MCG Caps capsule  Commonly known as:  LINZESS  Take 1 capsule (145 mcg total) by mouth daily.     LORazepam 1 MG tablet  Commonly known as:  ATIVAN  Take 1 tablet (1 mg total) by mouth 2 (two) times daily as needed for anxiety.     magnesium oxide 400 MG tablet  Commonly known as:  MAG-OX  Take 800 mg by mouth daily.     ondansetron 4 MG tablet  Commonly known as:  ZOFRAN  Take 1 tablet (4 mg total) by mouth every 6 (six) hours as needed for nausea.     REFRESH OP  Place 1 drop into both eyes as needed (dryness, irritation).     rizatriptan 5 MG tablet  Commonly known as:  MAXALT  Take 5 mg by mouth daily as needed for migraine. May repeat in 2 hours if needed     ZIANA gel  Generic drug:   clindamycin-tretinoin  Apply 1 application topically every other day.           Follow-up Information   Follow up with Caeson Filippi, Kathryne Eriksson, MD In 2 weeks.   Specialty:  General Surgery   Contact information:   [address], [place], [place] [address] [phone]       Signed: Gwenyth Ober 12/10/2013, 3:10 PM

## 2013-12-10 NOTE — Progress Notes (Signed)
GS Progress Note Subjective: I appreciate my partner, Dr. Georgette Dover, coming in to see this patient.  I believe that the significant RLQ abdominal pain was likely spasm, shoulder pain due to subdiaphragmatic gas.  She is stable currently with P 70, BP 96/47.  She has been receiving a lot of IV pain medications.  Objective: Vital signs in last 24 hours: Temp:  [97 F (36.1 C)-98.7 F (37.1 C)] 97.9 F (36.6 C) (07/15 0601) Pulse Rate:  [55-112] 61 (07/15 0601) Resp:  [9-23] 15 (07/15 0601) BP: (88-139)/(46-105) 88/46 mmHg (07/15 0601) SpO2:  [93 %-100 %] 98 % (07/15 0601) Last BM Date: 12/08/13  Intake/Output from previous day: 07/14 0701 - 07/15 0700 In: 2657 [P.O.:250; I.V.:2407] Out: 375 [Urine:375] Intake/Output this shift:    Lungs: Clear  Abd: Soft, good bowel sounds.  Mildly tender.  All of her incisions are erythematous without drainage, either a reaction to the Dermabond that was used or the subcuticular Monopryl suture.  Extremities: No DVT signs or symptoms.  PAS hose in place  Neuro: Intact  Lab Results: CBC   Recent Labs  12/09/13 0731 12/10/13 0034  WBC 6.3 12.5*  HGB 13.8 11.1*  HCT 41.5 33.5*  PLT 323 303   BMET  Recent Labs  12/09/13 0731 12/10/13 0034  NA 143 138  K 4.1 3.5*  CL 104 100  CO2 25 23  GLUCOSE 95 125*  BUN 8 9  CREATININE 0.81 0.82  CALCIUM 10.2 8.6   PT/INR No results found for this basename: LABPROT, INR,  in the last 72 hours ABG No results found for this basename: PHART, PCO2, PO2, HCO3,  in the last 72 hours  Studies/Results: Dg Cholangiogram Operative  12/09/2013   CLINICAL DATA:  Biliary dyskinesia.  EXAM: INTRAOPERATIVE CHOLANGIOGRAM  FLUOROSCOPY TIME:  9 seconds  COMPARISON:  Nuclear medicine HIDA scan - 12/05/2013; 11/12/2013; abdominal ultrasound - 10/27/2013; CT abdomen pelvis -10/06/2013.  FINDINGS: Intraoperative angiographic images of the right upper abdominal quadrant during laparoscopic cholecystectomy are  provided for review.  Surgical clips overlie the expected location of the gallbladder fossa.  Contrast injection demonstrates selective cannulation of the central aspect of the cystic duct.  There is passage of contrast through the central aspect of the cystic duct with filling of a non dilated common bile duct. There is passage of contrast though the CBD and into the descending portion of the duodenum.  There is minimal reflux of injected contrast into the common hepatic duct and central aspect of the non dilated intrahepatic biliary system.  There are no discrete filling defects within the opacified portions of the biliary system to suggest the presence of choledocholithiasis.  There is a very minimal amount of extravasation of contrast about the residual neck of the gallbladder.  IMPRESSION: No evidence of choledocholithiasis.   Electronically Signed   By: Sandi Mariscal M.D.   On: 12/09/2013 11:05    Anti-infectives: Anti-infectives   Start     Dose/Rate Route Frequency Ordered Stop   12/09/13 0600  ciprofloxacin (CIPRO) IVPB 400 mg     400 mg 200 mL/hr over 60 Minutes Intravenous On call to O.R. 12/08/13 1418 12/09/13 0922      Assessment/Plan: s/p Procedure(s): LAPAROSCOPIC CHOLECYSTECTOMY WITH INTRAOPERATIVE CHOLANGIOGRAM Advance diet Decrease IVFs. Ambulate. Probably DC to home later today  LOS: 1 day    Kathryne Eriksson. Dahlia Bailiff, MD, FACS [phone] [facility] 12/10/2013

## 2013-12-10 NOTE — Progress Notes (Signed)
Patient is doing better.  Okay to go home.  Has asked for anti-nausea medication and anti-anxiety medication.  Will send home with Zofran and a few Ativan pills.  Warned about taking Ativan with pain medication and as a sleeper.  Kathryne Eriksson. Dahlia Bailiff, MD, [place] [phone] [facility]

## 2013-12-10 NOTE — Consult Note (Signed)
GI Consultation  Referring Provider: No ref. provider found Primary Care Physician:  Treasa School, MD Primary Gastroenterologist:  Dr. Deatra Ina  Reason for Consultation:  *Gastroparesis**  HPI: Angela [name] is a 18 y.o. female *seen in the GI office for planes of abdominal pain, constipation and reflux.  Workup was pertinent for low gallbladder ejection fraction and delayed gastric emptying.  She status post laparoscopic cholecystectomy.  She took Reglan for several days and developed severe diarrhea and discontinued this.  She seemed to do well with dexilant with regards to reflux.  Except for some mild postoperative pain she is doing well.  She denies nausea or pyrosis.  She's on Protonix.**   Past Medical History  Diagnosis Date  . Pulmonary artery stenosis     left  . Acne   . GERD (gastroesophageal reflux disease)   . Complication of anesthesia     wakes up combative  . Cluster headaches   . Biliary dyskinesia 11/2013    symptomatic  . Chronic constipation   . Muscle spasms of neck     receives PT; needs to have neck support during surgery, per mother  . Closed head injury 2012  . Delayed gastric emptying   . Heart murmur     Past Surgical History  Procedure Laterality Date  . Hymenectomy  12/21/2006  . Laparoscopic appendectomy N/A 09/28/2013    Procedure: APPENDECTOMY LAPAROSCOPIC;  Surgeon: Gwenyth Ober, MD;  Location: [facility];  Service: General;  Laterality: N/A;  . Robotic assisted laparoscopic ovarian cystectomy Right 01/20/2011  . Esophagogastroduodenoscopy (egd) with propofol  07/26/2012  . Nasal septum surgery  2009    x 2  . Laparoscopic cholecystectomy  12/09/2013  . Tonsillectomy and adenoidectomy  1999  . Appendectomy      Prior to Admission medications   Medication Sig Start Date End Date Taking? Authorizing Provider  cetirizine (ZYRTEC) 10 MG tablet Take 10 mg by mouth at bedtime.    Yes Historical Provider, MD  clindamycin-tretinoin Pershing Proud)  gel Apply 1 application topically every other day.    Yes Historical Provider, MD  dexlansoprazole (DEXILANT) 60 MG capsule Take 1 capsule (60 mg total) by mouth daily. 10/30/13  Yes Jessica D. Zehr, PA-C  Linaclotide (LINZESS) 145 MCG CAPS capsule Take 1 capsule (145 mcg total) by mouth daily. 11/20/13  Yes Jessica D. Zehr, PA-C  magnesium oxide (MAG-OX) 400 MG tablet Take 800 mg by mouth daily.   Yes Historical Provider, MD  metoCLOPramide (REGLAN) 10 MG tablet Take 1 tablet (10 mg total) by mouth 4 (four) times daily -  before meals and at bedtime. 12/05/13  Yes Inda Castle, MD  Polyvinyl Alcohol-Povidone (REFRESH OP) Place 1 drop into both eyes as needed (dryness, irritation).    Yes Historical Provider, MD  rizatriptan (MAXALT) 5 MG tablet Take 5 mg by mouth daily as needed for migraine. May repeat in 2 hours if needed   Yes Historical Provider, MD  HYDROcodone-acetaminophen (NORCO/VICODIN) 5-325 MG per tablet Take 1-2 tablets by mouth every 4 (four) hours as needed for moderate pain or severe pain. 12/09/13   Gwenyth Ober, MD    Current Facility-Administered Medications  Medication Dose Route Frequency Provider Last Rate Last Dose  . dextrose 5 % and 0.45 % NaCl with KCl 20 mEq/L infusion   Intravenous Continuous Gwenyth Ober, MD 10 mL/hr at 12/10/13 [phone]    . enoxaparin (LOVENOX) injection 40 mg  40 mg Subcutaneous Q24H Gwenyth Ober, MD      . HYDROcodone-acetaminophen (NORCO/VICODIN) 5-325 MG per tablet 1-2 tablet  1-2 tablet Oral Q4H PRN Gwenyth Ober, MD   2 tablet at 12/10/13 0043  . HYDROmorphone (DILAUDID) injection 0.5-1 mg  0.5-1 mg Intravenous Q2H PRN Imogene Burn. Tsuei, MD   1 mg at 12/10/13 0534  . ketorolac (TORADOL) 30 MG/ML injection 30 mg  30 mg Intravenous 4 times per day Imogene Burn. Tsuei, MD   30 mg at 12/10/13 1146  . lactated ringers infusion   Intravenous Continuous Napoleon Form, MD 20 mL/hr at 12/09/13 [phone]    . Linaclotide (LINZESS) capsule 145 mcg  145 mcg Oral Daily  Gwenyth Ober, MD   145 mcg at 12/10/13 [phone]  . loratadine (CLARITIN) tablet 10 mg  10 mg Oral Daily Gwenyth Ober, MD   10 mg at 12/10/13 0953  . ondansetron (ZOFRAN) tablet 4 mg  4 mg Oral Q6H PRN Gwenyth Ober, MD       Or  . ondansetron [facility]) injection 4 mg  4 mg Intravenous Q6H PRN Gwenyth Ober, MD      . pantoprazole (PROTONIX) EC tablet 40 mg  40 mg Oral Daily Gwenyth Ober, MD   40 mg at 12/10/13 1108  . SUMAtriptan (IMITREX) tablet 50 mg  50 mg Oral Q2H PRN Gwenyth Ober, MD        Allergies as of 12/04/2013 - Review Complete 12/04/2013  Allergen Reaction Noted  . Compazine [prochlorperazine edisylate] Other (See Comments) 12/10/2011  . Ketamine Rash and Other (See Comments) 01/06/2011  . Potassium clavulanate [clavulanic acid] Nausea And Vomiting 10/30/2013  . Topamax [topiramate] Hives and Rash 12/10/2011  . Adhesive [tape] Rash 12/04/2013  . Flagyl [metronidazole] Rash 10/30/2013    Family History  Problem Relation Age of Onset  . Colon cancer Maternal Grandmother   . Other Maternal Grandfather     pituitary tumor  . Diabetes Paternal Grandmother   . Heart Problems Paternal Grandfather     A fib, CHF    History   Social History  . Marital Status: Single    Spouse Name: N/A    Number of Children: 0  . Years of Education: Sophmore   Occupational History  . student     [employer]   Social History Main Topics  . Smoking status: Never Smoker   . Smokeless tobacco: Never Used  . Alcohol Use: No  . Drug Use: No  . Sexual Activity: No   Other Topics Concern  . Not on file   Social History Narrative   Pt lives at home with family.   Caffeine Use: very little    Review of Systems: Pertinent positive and negative review of systems were noted in the above history of present illness section. All other review of systems were otherwise negative   Physical Exam: Vital signs in last 24 hours: Temp:  [97 F (36.1 C)-98.7 F (37.1 C)] 98.2 F (36.8 C) (07/15  0837) Pulse Rate:  [59-112] 70 (07/15 0837) Resp:  [11-23] 15 (07/15 0601) BP: (88-128)/(46-88) 96/47 mmHg (07/15 0837) SpO2:  [93 %-100 %] 100 % (07/15 0837)  General:   Alert,  Well-developed, well-nourished, pleasant and cooperative in NAD Head:  Normocephalic and atraumatic. Eyes:  Sclera clear, no icterus.   Conjunctiva pink. Ears:  Normal auditory acuity. Nose:  No deformity, discharge,  or lesions. Mouth:  No deformity or lesions.  Oropharynx  pink & moist. Neck:  Supple; no masses or thyromegaly. Lungs:  Clear throughout to auscultation.   No wheezes, crackles, or rhonchi. No acute distress. Heart:  Regular rate and rhythm; no murmurs, clicks, rubs,  or gallops. Abdomen:  Soft, nontender and nondistended. No masses, hepatosplenomegaly or hernias noted. Normal bowel sounds, without guarding, and without rebound.   Rectal:  Deferred until time of colonoscopy.   Msk:  Symmetrical without gross deformities. Normal posture. Pulses:  Normal pulses noted. Extremities:  Without clubbing or edema. Neurologic:  Alert and  oriented x4;  grossly normal neurologically. Skin:  Intact without significant lesions or rashes. Cervical Nodes:  No significant cervical adenopathy. Psych:  Alert and cooperative. Normal mood and affect.  Intake/Output from previous day: 07/14 0701 - 07/15 0700 In: 2657 [P.O.:250; I.V.:2407] Out: 375 [Urine:375] Intake/Output this shift: Total I/O In: 240 [P.O.:240] Out: 300 [Urine:300]  Lab Results:  Recent Labs  12/09/13 0731 12/10/13 0034  WBC 6.3 12.5*  HGB 13.8 11.1*  HCT 41.5 33.5*  PLT 323 303   BMET  Recent Labs  12/09/13 0731 12/10/13 0034  NA 143 138  K 4.1 3.5*  CL 104 100  CO2 25 23  GLUCOSE 95 125*  BUN 8 9  CREATININE 0.81 0.82  CALCIUM 10.2 8.6   LFT  Recent Labs  12/10/13 0034  PROT 5.8*  ALBUMIN 3.3*  AST 26  ALT 37*  ALKPHOS 72  BILITOT 0.5   PT/INR No results found for this basename: LABPROT, INR,  in the  last 72 hours Hepatitis Panel No results found for this basename: HEPBSAG, HCVAB, HEPAIGM, HEPBIGM,  in the last 72 hours Additional Labs   Studies/Results: Dg Cholangiogram Operative  12/09/2013   CLINICAL DATA:  Biliary dyskinesia.  EXAM: INTRAOPERATIVE CHOLANGIOGRAM  FLUOROSCOPY TIME:  9 seconds  COMPARISON:  Nuclear medicine HIDA scan - 12/05/2013; 11/12/2013; abdominal ultrasound - 10/27/2013; CT abdomen pelvis -10/06/2013.  FINDINGS: Intraoperative angiographic images of the right upper abdominal quadrant during laparoscopic cholecystectomy are provided for review.  Surgical clips overlie the expected location of the gallbladder fossa.  Contrast injection demonstrates selective cannulation of the central aspect of the cystic duct.  There is passage of contrast through the central aspect of the cystic duct with filling of a non dilated common bile duct. There is passage of contrast though the CBD and into the descending portion of the duodenum.  There is minimal reflux of injected contrast into the common hepatic duct and central aspect of the non dilated intrahepatic biliary system.  There are no discrete filling defects within the opacified portions of the biliary system to suggest the presence of choledocholithiasis.  There is a very minimal amount of extravasation of contrast about the residual neck of the gallbladder.  IMPRESSION: No evidence of choledocholithiasis.   Electronically Signed   By: Sandi Mariscal M.D.   On: 12/09/2013 11:05    Active Problems:   Cholecystitis, chronic     IMPRESSION:   *1.  Gastroparesis.  Etiology for this is unclear.  Preoperatively she was complaining of nausea and pyrosis, symptoms that could be attributable to gastroparesis.  Both are currently well controlled.  Since she did not*tolerate Reglan would not restart.  Recommend discharge in on dexilant 60 mg daily. 2.  abdominal pain-likely secondary to chronic cholecystitis    PLAN:     *DC home on  dexilant 60 mg daily.  She will follow up with GI in the next 4-6 weeks, sooner if necessary.**  Sandy Salaam. Deatra Ina, MD, [facility] [phone]    12/10/2013, 11:56 AM

## 2013-12-11 ENCOUNTER — Telehealth (INDEPENDENT_AMBULATORY_CARE_PROVIDER_SITE_OTHER): Payer: Self-pay | Admitting: Surgery

## 2013-12-11 ENCOUNTER — Telehealth (INDEPENDENT_AMBULATORY_CARE_PROVIDER_SITE_OTHER): Payer: Self-pay

## 2013-12-11 NOTE — Telephone Encounter (Signed)
Ms. Defelice had a lap chole by Dr. Hulen Skains on 12/09/2013.  Her mother called me.  She is having a "reaction" to the dermabond.  She took some of Mom's Atarax.  So I called in Atarax, 25 mg tabs, #16, to [facility] at [phone].  She will call back is there are further problems.  Alphonsa Overall, MD, [facility] Pager: [phone] Office phone:  [phone]

## 2013-12-11 NOTE — Telephone Encounter (Signed)
Pt is po lap chole on 12/09/13 by Dr. Hulen Skains.  Her mothers is calling to report that the patient is having a reaction to the derma-bond.  She has a history of reaction to steri-strips.  The incisional area is itchy and red.  Dr. Hulen Skains paged and said okay to try to remove as much of the glue as possible since there are sutures underneath.  Pt should continue to take Benadryl.  No post op appt available.  Advised the mother I would have Dr. Richarda Blade nurse give her a call.

## 2013-12-11 NOTE — Telephone Encounter (Signed)
Left appt info on VM

## 2013-12-12 ENCOUNTER — Telehealth (INDEPENDENT_AMBULATORY_CARE_PROVIDER_SITE_OTHER): Payer: Self-pay

## 2013-12-12 ENCOUNTER — Encounter (HOSPITAL_COMMUNITY): Payer: Self-pay | Admitting: General Surgery

## 2013-12-12 NOTE — Telephone Encounter (Signed)
Spoke with Mother. Mother states that pt is still having some itching episodes.  Dr Lucia Gaskins called in Atarax last night, this has helped however when its about time to take meds again she starts itching. Mother states that she has put hydrocortisone cream around the wound. Reinforced to make sure that no cream gets into the wound.  Reinforced to the mother to give [place] a call back if she starts having fevers, chills. Mother verbalized understanding.

## 2013-12-18 ENCOUNTER — Encounter (INDEPENDENT_AMBULATORY_CARE_PROVIDER_SITE_OTHER): Payer: Self-pay

## 2013-12-18 ENCOUNTER — Ambulatory Visit (INDEPENDENT_AMBULATORY_CARE_PROVIDER_SITE_OTHER): Admitting: General Surgery

## 2013-12-18 ENCOUNTER — Encounter (INDEPENDENT_AMBULATORY_CARE_PROVIDER_SITE_OTHER): Payer: Self-pay | Admitting: General Surgery

## 2013-12-18 VITALS — BP 126/76 | HR 100 | Temp 97.3°F | Ht 67.0 in | Wt 162.0 lb

## 2013-12-18 DIAGNOSIS — Z09 Encounter for follow-up examination after completed treatment for conditions other than malignant neoplasm: Secondary | ICD-10-CM

## 2013-12-18 NOTE — Progress Notes (Signed)
Subjective:     Patient ID: Angela [name], female   DOB: Aug 01, 1995, 18 y.o.   MRN: [id number]  HPI The patient is doing fine with the exception of some occasional abdominal cramping, intestinal cramping as the patient calls it, and a severe reaction to either the Monocryl for the Dermabond.  Review of Systems Severe reaction to Monocryl or Dermabond.     Objective:   Physical Exam Abdominal tenderness. Severe peri-incisional rash related to either the suture material for the Dermabond was used.       Assessment:     Status post laparoscopic cholecystectomy for biliary dyskinesia. Her continued intestinal cramping is likely unrelated to the cholecystectomy and will be treated by her gastroenterologist.  I will list Dermabond and Monocryl as substances to which she is allergic.     Plan:     Return to see me on an as-needed basis. Followup with a gastroenterologist for continued intestinal cramping.

## 2013-12-22 ENCOUNTER — Encounter: Payer: Self-pay | Admitting: Gastroenterology

## 2013-12-22 ENCOUNTER — Ambulatory Visit (INDEPENDENT_AMBULATORY_CARE_PROVIDER_SITE_OTHER): Admitting: Gastroenterology

## 2013-12-22 VITALS — BP 98/60 | HR 70 | Ht 67.0 in | Wt 165.0 lb

## 2013-12-22 DIAGNOSIS — K5900 Constipation, unspecified: Secondary | ICD-10-CM

## 2013-12-22 DIAGNOSIS — K219 Gastro-esophageal reflux disease without esophagitis: Secondary | ICD-10-CM

## 2013-12-22 DIAGNOSIS — K3184 Gastroparesis: Secondary | ICD-10-CM

## 2013-12-22 DIAGNOSIS — R109 Unspecified abdominal pain: Secondary | ICD-10-CM

## 2013-12-22 MED ORDER — LINACLOTIDE 145 MCG PO CAPS: 145.0000 ug | ORAL_CAPSULE | Freq: Every day | ORAL | Status: DC

## 2013-12-22 MED ORDER — METOCLOPRAMIDE HCL 5 MG PO TABS: ORAL_TABLET | ORAL | Status: DC

## 2013-12-22 MED ORDER — DEXLANSOPRAZOLE 60 MG PO CPDR: 60.0000 mg | DELAYED_RELEASE_CAPSULE | Freq: Every day | ORAL | Status: DC

## 2013-12-22 MED ORDER — HYOSCYAMINE SULFATE 0.125 MG SL SUBL: 0.1250 mg | SUBLINGUAL_TABLET | SUBLINGUAL | Status: DC | PRN

## 2013-12-22 NOTE — Patient Instructions (Signed)
We have given you a brochure on Gastroparesis.  We sent  prescripions to [place] 2. Linzess 3. Reglan 4. Levsin SL  We made you an appointment with Alonza Bogus PA-C on 01-05-2014 at 9:30 am .

## 2013-12-22 NOTE — Progress Notes (Signed)
12/22/2013 Angela [name] [id number] 02/06/1996   History of Present Illness:  This is a 18 year old female who has been followed by Dr. Deatra Ina for treatment of GERD. She underwent an EGD in February 2014 at which time she was found to have some non-erosive esophagitis.  She has been taking Dexilant 60 mg daily for that issue.  She was seen here in early June by myself with several different complaints/issues.  First, she reported some chronic issues with constipation.  She had been on MiraLax in the past around age 67 with no improvement in her symptoms. She had tried stool softeners as well and had taken Metamucil only on an as-needed basis in the past without any relief. She reported that she usually goes 2-3 days without a bowel movement and then has to strain to move her bowels. She was taking Peri-Colace or drinking castor oil as needed, which helped her to move her bowels, but she wanted to know if there was something different that she could take.  She was started on Linzess 145 mcg daily, which she has been taking and says that it is working well for that issue.    She was also complaining of abdominal pain that was present prior to her appendectomy (performed on 09/28/2013) and did not resolved with the surgery.  The pain was located approximately mid-abdomen and she stated that it radiated around to both of her sides at times. This pain was different than the pain she had in her right lower quadrant prior to appendectomy. The pain had been present for approximately 6 weeks. She stated that it occured usually twice a day randomly, not necessarily related to eating and felt like someone was "twisting my insides".  Also complained of nausea, but that had only been present for approximately 2 weeks and occured mostly after eating but could be present throughout the entire day as well. She had abdominal ultrasound on 10/27/2013, which was normal.  She subsequently underwent a HIDA scan, which showed  low gallbladder EF at 23%.  She was scheduled to see a surgeon at [facility] but wanted to have the GES that was discussed at her visit as well prior to undergoing cholecystectomy.  Unfortunately, that was abnormal as well showing high gastric retention rate with 52% retention at 120 minutes.  She was started on Reglan 10 mg ACHS, but states that the medication made her feel horrible, causing diarrhea and abdominal pain so she stopped it after a very short time.  She is here with her mother again today asking if there is a lower dose that she could try instead.  She ended up having cholecystectomy on 7/14 because the surgeon, Dr. Hulen Skains, thought that her abdominal pain sounded gallbladder related.  At her last visit she was given levsin 0.125 mg SL prescription to use prn but she is asking for a new prescription for that since she misplaced the previous one.  As stated a her previous visit, she is leaving for a mission trip in [place] on September 1.   Current Medications, Allergies, Past Medical History, Past Surgical History, Family History and Social History were reviewed in [employer] record.   Physical Exam: BP 98/60  Pulse 70  Ht 5\' 7"  (1.702 m)  Wt 165 lb (74.844 kg)  BMI 25.84 kg/m2  LMP 12/02/2013 General: Well developed white female in no acute distress Head: Normocephalic and atraumatic Eyes:  Sclerae anicteric, conjunctiva pink  Ears: Normal auditory acuity Lungs:  Clear throughout to auscultation Heart: Regular rate and rhythm Abdomen: Soft, non-distended.  Normal bowel sounds.  Mild right sided TTP (and at recent incision sites from cholecystectomy). Musculoskeletal: Symmetrical with no gross deformities  Extremities: No edema  Neurological: Alert oriented x 4, grossly non-focal Psychological:  Alert and cooperative. Normal mood and affect  Assessment and Recommendations: -GERD/gastroparesis:  She will continue Dexilant 60 mg daily; refills given.  Recent GES  shows gastroparesis.  She did not tolerate Reglan 10 mg ACHS due to non-extrapyramidal side effects.  She would like to try lower dose.  Will start Reglan 5 mg BID; prescription given.  Gastroparesis dietary instructions given as well. -Constipation/IBS:  Doing well on Linzess 145 mcg daily; refills given.  We will also give her a new prescription for levsin 0.125 mg SL to use prn for intestinal cramping/spasming since she does not know what happened to the prescription that she was given for that in June. -Abdominal pain and nausea:  Likely a cumulative result of the above issues.  Also she is s/p recent cholecystectomy on 12/09/13; surgery thought that biliary dyskinesia found on recent HIDA could be accounting for her abdominal pain.  *Follow-up in three weeks (needs to be seen again for leaving for her 9 month mission trip).

## 2013-12-23 NOTE — Progress Notes (Signed)
Reviewed and agree with management. Alayjah Boehringer D. Jasiel Belisle, M.D., FACG  

## 2013-12-30 ENCOUNTER — Telehealth: Payer: Self-pay | Admitting: Gastroenterology

## 2013-12-30 NOTE — Telephone Encounter (Signed)
I don't know if these were side effects of the medication, but the only way to know is to remain off of it.  The only other option would be to try domperidone.  We can increase the dose of the linzess to 290 mcg daily.  Thank you,  Jess

## 2013-12-30 NOTE — Telephone Encounter (Signed)
Spoke with patient's mother and she states the patient has had chest tightness across her sternum and a sore throat for 2 days. Mother told patient to stop Reglan because this could be side effects. She stopped the Reglan last night. Also states patient wants to increase the dose of Linzess. Please, advise.

## 2013-12-31 MED ORDER — LINACLOTIDE 290 MCG PO CAPS: 290.0000 ug | ORAL_CAPSULE | Freq: Every day | ORAL | Status: DC

## 2013-12-31 NOTE — Telephone Encounter (Signed)
Rx sent to pharmacy. Left a message for patient to call me. 

## 2014-01-02 NOTE — Telephone Encounter (Signed)
Left a message for patient to call back. 

## 2014-01-05 ENCOUNTER — Ambulatory Visit: Admitting: Gastroenterology

## 2014-01-06 NOTE — Telephone Encounter (Signed)
Spoke with patient and gave her the recommendations. She has stopped the Reglan. She wants to think about the Domperidone. She would like to schedule a f/u OV with Alonza Bogus, PA. Scheduled in 01/26/14 at 9:00 AM.

## 2014-01-26 ENCOUNTER — Ambulatory Visit: Admitting: Gastroenterology

## 2014-01-26 ENCOUNTER — Other Ambulatory Visit: Payer: Self-pay

## 2014-01-26 ENCOUNTER — Telehealth: Payer: Self-pay | Admitting: Gastroenterology

## 2014-01-26 MED ORDER — DEXLANSOPRAZOLE 60 MG PO CPDR: 60.0000 mg | DELAYED_RELEASE_CAPSULE | Freq: Every day | ORAL | Status: DC

## 2014-01-26 NOTE — Telephone Encounter (Signed)
LMOM for patient to call back, unsure what medications she needs refilled.

## 2014-01-26 NOTE — Telephone Encounter (Signed)
Spoke with the patient. She was requesting a written Rx for Dexilant [place]. It is not due for refill until October 2015. For her convenience, a written prescription provided for her to pick up.

## 2014-01-29 DIAGNOSIS — G44221 Chronic tension-type headache, intractable: Secondary | ICD-10-CM | POA: Insufficient documentation

## 2014-03-20 ENCOUNTER — Other Ambulatory Visit: Payer: Self-pay | Admitting: Diagnostic Neuroimaging

## 2014-04-27 ENCOUNTER — Telehealth: Payer: Self-pay | Admitting: Gastroenterology

## 2014-04-27 NOTE — Telephone Encounter (Signed)
I have spoken to patient's mother. Patient needs prior auth on her Dexilant 60 mg capsules. She needs a 30 day supply and then a [facility] in order for her to get the medication as cheep as possible. Patient has tried Nexium, omeprazole and ranitidine in the past for her GERD/esophagitis. ([facility] # is [id number]). I have done prior authorization form on covermymeds.com and am awaiting response. Formulary medication alternatives include: Nexium, omeprazole and pantoprazole.

## 2014-04-28 MED ORDER — DEXLANSOPRAZOLE 60 MG PO CPDR: 60.0000 mg | DELAYED_RELEASE_CAPSULE | Freq: Every day | ORAL | Status: DC

## 2014-04-28 MED ORDER — DEXLANSOPRAZOLE 60 MG PO CPDR
60.0000 mg | DELAYED_RELEASE_CAPSULE | Freq: Every day | ORAL | Status: DC
Start: 2014-04-28 — End: 2014-04-28

## 2014-04-28 NOTE — Telephone Encounter (Signed)
I have spoken to [place] at [facility] and a medical necessity tier exception was given for patient (pt has tried and failed omeprazole and nexium. Pantoprazole likely to be therapeutic failure). Case ID: [id number] effective from 03/29/14-05/28/98. I have also sent 30 day supply of Dexilant to [facility], [place], [place]. Per insurance, test claim went through for $20.00. I have advised patient's mother of this.

## 2014-04-28 NOTE — Telephone Encounter (Signed)
[phone] opt 1. Needs filed as medical necessity.

## 2014-04-28 NOTE — Telephone Encounter (Signed)
Per Covermymeds.com, CaseId:[id number];Status:Cancelled;Explanation:This medication has been approved. Coverage end date for the drug:05/28/2098.  I have advised patient's mother of approval. [facility] sent [facility] for 90 day supply. Mother will call me back with which pharmacy to send 30 day supply to.

## 2014-07-14 DIAGNOSIS — F5104 Psychophysiologic insomnia: Secondary | ICD-10-CM | POA: Insufficient documentation

## 2014-09-05 ENCOUNTER — Other Ambulatory Visit: Payer: Self-pay | Admitting: Gastroenterology

## 2014-09-05 ENCOUNTER — Other Ambulatory Visit: Payer: Self-pay | Admitting: Diagnostic Neuroimaging

## 2014-11-24 ENCOUNTER — Other Ambulatory Visit: Payer: Self-pay | Admitting: Gastroenterology

## 2014-11-24 ENCOUNTER — Other Ambulatory Visit: Payer: Self-pay | Admitting: Diagnostic Neuroimaging

## 2014-11-24 NOTE — Telephone Encounter (Signed)
Hasn't been seen in over 1 year

## 2014-11-27 NOTE — Telephone Encounter (Signed)
I have not been able to reach the patient.  Sent Rx so they do not run out over the holiday weekend.

## 2014-12-13 ENCOUNTER — Other Ambulatory Visit: Payer: Self-pay | Admitting: Diagnostic Neuroimaging

## 2015-02-03 DIAGNOSIS — F22 Delusional disorders: Secondary | ICD-10-CM | POA: Insufficient documentation

## 2015-02-03 DIAGNOSIS — F32A Depression, unspecified: Secondary | ICD-10-CM | POA: Insufficient documentation

## 2015-02-05 ENCOUNTER — Ambulatory Visit (INDEPENDENT_AMBULATORY_CARE_PROVIDER_SITE_OTHER): Admitting: Licensed Clinical Social Worker

## 2015-02-05 DIAGNOSIS — F331 Major depressive disorder, recurrent, moderate: Secondary | ICD-10-CM | POA: Diagnosis not present

## 2015-02-06 DIAGNOSIS — J309 Allergic rhinitis, unspecified: Secondary | ICD-10-CM | POA: Insufficient documentation

## 2015-02-09 ENCOUNTER — Encounter (INDEPENDENT_AMBULATORY_CARE_PROVIDER_SITE_OTHER): Payer: Self-pay

## 2015-02-09 ENCOUNTER — Encounter (HOSPITAL_COMMUNITY): Payer: Self-pay | Admitting: Psychiatry

## 2015-02-09 ENCOUNTER — Ambulatory Visit (INDEPENDENT_AMBULATORY_CARE_PROVIDER_SITE_OTHER): Admitting: Psychiatry

## 2015-02-09 VITALS — BP 109/72 | HR 81 | Ht 67.0 in | Wt 170.6 lb

## 2015-02-09 DIAGNOSIS — F339 Major depressive disorder, recurrent, unspecified: Secondary | ICD-10-CM | POA: Diagnosis not present

## 2015-02-09 DIAGNOSIS — F902 Attention-deficit hyperactivity disorder, combined type: Secondary | ICD-10-CM

## 2015-02-09 DIAGNOSIS — G43011 Migraine without aura, intractable, with status migrainosus: Secondary | ICD-10-CM

## 2015-02-09 DIAGNOSIS — F909 Attention-deficit hyperactivity disorder, unspecified type: Secondary | ICD-10-CM | POA: Insufficient documentation

## 2015-02-09 DIAGNOSIS — F411 Generalized anxiety disorder: Secondary | ICD-10-CM | POA: Diagnosis not present

## 2015-02-09 MED ORDER — MIRTAZAPINE 15 MG PO TABS: 15.0000 mg | ORAL_TABLET | Freq: Every day | ORAL | Status: DC
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
## 2015-02-12 ENCOUNTER — Ambulatory Visit (HOSPITAL_COMMUNITY)
Admission: RE | Admit: 2015-02-12 | Discharge: 2015-02-12 | Disposition: A | Discharge status: Home / Self Care | Source: Ambulatory Visit | Attending: Psychiatry | Admitting: Psychiatry

## 2015-02-12 DIAGNOSIS — G43011 Migraine without aura, intractable, with status migrainosus: Secondary | ICD-10-CM | POA: Diagnosis not present

## 2015-02-12 DIAGNOSIS — F339 Major depressive disorder, recurrent, unspecified: Secondary | ICD-10-CM

## 2015-02-12 NOTE — Procedures (Signed)
ELECTROENCEPHALOGRAM REPORT  Date of Study: 02/12/2015  Patient's Name: Angela [name] MRN: [id number] Date of Birth: May 29, 1996  Referring Provider: Dr. Erin Sons  Clinical History: This is a 19 year old woman with history of concussion, headaches, constant rumination, disorganized, impatient.   Medications: cetirizine (ZYRTEC) 10 MG tablet  clindamycin-tretinoin (ZIANA) gel DEXILANT 60 MG capsule dexlansoprazole (DEXILANT) 60 MG capsule hyoscyamine (LEVSIN/SL) 0.125 MG SL tablet Linaclotide (LINZESS) 290 MCG CAPS capsule LORazepam (ATIVAN) 1 MG tablet magnesium oxide (MAG-OX) 400 MG tablet MAXALT 10 MG tablet metoCLOPramide (REGLAN) 5 MG tablet mirtazapine (REMERON) 15 MG tablet ondansetron (ZOFRAN) 4 MG tablet tiZANidine (ZANAFLEX) 4 MG tablet  Technical Summary: A multichannel digital EEG recording measured by the international 10-20 system with electrodes applied with paste and impedances below 5000 ohms performed in our laboratory with EKG monitoring in an awake and drowsy patient.  Hyperventilation and photic stimulation were  performed.  The digital EEG was referentially recorded, reformatted, and digitally filtered in a variety of bipolar and referential montages for optimal display.    Description: The patient is awake and drowsy during the recording.  During maximal wakefulness, there is a symmetric, medium voltage 10 Hz posterior dominant rhythm that attenuates with eye opening.  The record is symmetric.  During drowsiness, there is an increase in theta slowing of the background.  Deeper stages of sleep were not seen.  Hyperventilation and photic stimulation did not elicit any abnormalities.  There were no epileptiform discharges or electrographic seizures seen.    EKG lead was unremarkable.  Impression: This awake and drowsy EEG is normal.    Clinical Correlation: A normal EEG does not exclude a clinical diagnosis of epilepsy. Clinical correlation is  advised.   Ellouise Newer, M.D.

## 2015-02-12 NOTE — Progress Notes (Signed)
EEG Completed; Results Pending  

## 2015-02-15 ENCOUNTER — Ambulatory Visit (INDEPENDENT_AMBULATORY_CARE_PROVIDER_SITE_OTHER): Admitting: Licensed Clinical Social Worker

## 2015-02-15 DIAGNOSIS — F331 Major depressive disorder, recurrent, moderate: Secondary | ICD-10-CM | POA: Diagnosis not present

## 2015-02-16 ENCOUNTER — Telehealth (HOSPITAL_COMMUNITY): Payer: Self-pay

## 2015-02-16 ENCOUNTER — Telehealth: Payer: Self-pay | Admitting: Gastroenterology

## 2015-02-16 NOTE — Telephone Encounter (Signed)
Dr. Salem Senate reviewed record and there is no release of information consent present for Dr. Salem Senate to call patient's Mother back.

## 2015-02-16 NOTE — Telephone Encounter (Signed)
Left a message for patient's mother to call back. 

## 2015-02-18 NOTE — Telephone Encounter (Signed)
Spoke with patient's mother and moved OV to 02/24/15.

## 2015-02-23 ENCOUNTER — Encounter (HOSPITAL_COMMUNITY): Payer: Self-pay | Admitting: Psychiatry

## 2015-02-23 ENCOUNTER — Ambulatory Visit (INDEPENDENT_AMBULATORY_CARE_PROVIDER_SITE_OTHER): Admitting: Psychiatry

## 2015-02-23 ENCOUNTER — Telehealth (HOSPITAL_COMMUNITY): Payer: Self-pay

## 2015-02-23 VITALS — BP 105/60 | HR 86 | Ht 67.0 in | Wt 174.2 lb

## 2015-02-23 DIAGNOSIS — F331 Major depressive disorder, recurrent, moderate: Secondary | ICD-10-CM

## 2015-02-23 DIAGNOSIS — F902 Attention-deficit hyperactivity disorder, combined type: Secondary | ICD-10-CM | POA: Diagnosis not present

## 2015-02-23 DIAGNOSIS — F411 Generalized anxiety disorder: Secondary | ICD-10-CM

## 2015-02-23 MED ORDER — ESCITALOPRAM OXALATE 20 MG PO TABS: 20.0000 mg | ORAL_TABLET | Freq: Every day | ORAL | Status: DC

## 2015-02-23 MED ORDER — METHYLPHENIDATE HCL ER (OSM) 18 MG PO TBCR: 18.0000 mg | EXTENDED_RELEASE_TABLET | Freq: Two times a day (BID) | ORAL | Status: DC

## 2015-02-23 NOTE — Telephone Encounter (Signed)
Telephone call with patient's Mother after she had left a message she had gone to her [facility] and patient's Lexapro order was not there. Informed order from Dr. Salem Senate was e-scribed to [facility] today and not [facility]. Ms. Berrey requested an order be sent to [facility] as wants this prescription at a local pharmacy until the dosage is settled and then wants later order to be sent to [facility].  States she can have medication filled two times at a local pharmacy before having to order through [facility].  Agreed to discuss with Dr.Tadepalli and change order.  Met with Dr. Salem Senate who approved a new Lexapro order be sent to patient's [facility] plus one refill.  Order e-scribed to [facility] as requested with one refill.  Called [facility] to cancel out order that was e-scribed over earlier this date due to patient's Mother now wanting this at a local pharmacy.   Representative from [facility] reported the order had been filled and was sending a message to cancel order and stop delivery.  Agreed to call [facility] and inform them if they had any problems filling medication locally to call their pharmacy help desk at [phone] for an override to fill new prescription for Lexapro. [facility] and spoke with Elgie Congo to inform of information from [facility] and they agree to call them if any problems filling Lexapro order e-scribed over late this afternoon.
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
## 2015-02-24 ENCOUNTER — Ambulatory Visit: Payer: Self-pay | Admitting: Gastroenterology

## 2015-02-24 ENCOUNTER — Telehealth: Payer: Self-pay | Admitting: Gastroenterology

## 2015-02-24 NOTE — Telephone Encounter (Signed)
9/28 pt was going to cancel same day apt, but decided to keep it since she has had to recent no show's and this would be a same day cancellation.

## 2015-02-24 NOTE — Telephone Encounter (Signed)
Patient is rescheduled to 03/09/15 1:30.  I rescheduled this appt with her mother

## 2015-02-25 ENCOUNTER — Ambulatory Visit (INDEPENDENT_AMBULATORY_CARE_PROVIDER_SITE_OTHER)

## 2015-02-25 DIAGNOSIS — J309 Allergic rhinitis, unspecified: Secondary | ICD-10-CM | POA: Diagnosis not present

## 2015-03-02 ENCOUNTER — Ambulatory Visit (INDEPENDENT_AMBULATORY_CARE_PROVIDER_SITE_OTHER)

## 2015-03-02 DIAGNOSIS — J309 Allergic rhinitis, unspecified: Secondary | ICD-10-CM | POA: Diagnosis not present

## 2015-03-03 ENCOUNTER — Telehealth (HOSPITAL_COMMUNITY): Payer: Self-pay

## 2015-03-04 ENCOUNTER — Ambulatory Visit (INDEPENDENT_AMBULATORY_CARE_PROVIDER_SITE_OTHER): Admitting: Neurology

## 2015-03-04 DIAGNOSIS — J309 Allergic rhinitis, unspecified: Secondary | ICD-10-CM | POA: Diagnosis not present

## 2015-03-08 ENCOUNTER — Ambulatory Visit: Admitting: Licensed Clinical Social Worker

## 2015-03-09 ENCOUNTER — Ambulatory Visit (INDEPENDENT_AMBULATORY_CARE_PROVIDER_SITE_OTHER): Admitting: Gastroenterology

## 2015-03-09 ENCOUNTER — Encounter: Payer: Self-pay | Admitting: Gastroenterology

## 2015-03-09 VITALS — BP 110/70 | HR 80 | Ht 67.0 in | Wt 173.2 lb

## 2015-03-09 DIAGNOSIS — K2100 Gastro-esophageal reflux disease with esophagitis, without bleeding: Secondary | ICD-10-CM

## 2015-03-09 DIAGNOSIS — K3184 Gastroparesis: Secondary | ICD-10-CM | POA: Diagnosis not present

## 2015-03-09 MED ORDER — DEXLANSOPRAZOLE 60 MG PO CPDR: 60.0000 mg | DELAYED_RELEASE_CAPSULE | Freq: Every day | ORAL | Status: DC

## 2015-03-09 NOTE — Patient Instructions (Signed)
We are referring you to Dr Hulen Skains at [facility]  Probiotics you can use are [place] Your appointment with Dr Hulen Skains is scheduled on 03/16/2015 at 10:40am arrive at 10:10am to fill out paperwork

## 2015-03-10 ENCOUNTER — Encounter: Payer: Self-pay | Admitting: Gastroenterology

## 2015-03-10 DIAGNOSIS — K2100 Gastro-esophageal reflux disease with esophagitis, without bleeding: Secondary | ICD-10-CM | POA: Insufficient documentation

## 2015-03-10 NOTE — Progress Notes (Signed)
Recommend lowest dose of PPI needed to control symptoms. I am happy to see them in clinic to discuss further side effects of PPI. She is fairly young for a reflux surgery and would need pH testing and manometry prior to being considered a candidate. Can you book her a follow up with me to discuss? Thanks

## 2015-03-10 NOTE — Progress Notes (Signed)
     03/10/2015 Angela [name] [id number] 1996/04/02   History of Present Illness:  This is a 19 year old female who is previously known to Dr. Deatra Ina.  She underwent an EGD in February 2014 at which time she was found to have some non-erosive esophagitis.  She has been taking Dexilant 60 mg daily and is doing well with the medication, however, she says that even if she misses one dose then she has symptoms.  Is here today with her mother.  Both patient and mom have several questions and are rather demanding.  Had been on trial of Reglan in the past for gastroparesis but reported that the medication made her feel poorly.  Current Medications, Allergies, Past Medical History, Past Surgical History, Family History and Social History were reviewed in [employer] record.   Physical Exam: BP 110/70 mmHg  Pulse 80  Ht 5\' 7"  (1.702 m)  Wt 173 lb 3.2 oz (78.563 kg)  BMI 27.12 kg/m2  LMP 03/09/2015 General: Well developed whit female in no acute distress Head: Normocephalic and atraumatic Eyes:  Sclerae anicteric, conjunctiva pink  Ears: Normal auditory acuity Lungs: Clear throughout to auscultation Heart: Regular rate and rhythm Abdomen: Soft, non-distended.  Normal bowel sounds.  Non-tender. Musculoskeletal: Symmetrical with no gross deformities  Extremities: No edema  Neurological: Alert oriented x 4, grossly non-focal Psychological:  Alert and cooperative. Normal mood and affect  Assessment and Recommendations:   -GERD with some gastroparesis:  Well-controlled on Dexilant 60 mg daily, but symptomatic if she misses even one dose.  Patient concerned about taking medication long-term since she is so young.  Patient asking about other options and is requesting surgical consult to discuss more permanent fix.  Will refer back to [facility], Dr. Hulen Skains, to discuss.  Continue Dexilant for now and will refill prescription.  *25 minutes spent with patient over 50% in counseling and  discussion of options

## 2015-03-11 ENCOUNTER — Ambulatory Visit (INDEPENDENT_AMBULATORY_CARE_PROVIDER_SITE_OTHER)

## 2015-03-11 DIAGNOSIS — J309 Allergic rhinitis, unspecified: Secondary | ICD-10-CM | POA: Diagnosis not present

## 2015-03-17 ENCOUNTER — Ambulatory Visit: Payer: Self-pay | Admitting: Gastroenterology

## 2015-03-18 ENCOUNTER — Ambulatory Visit (INDEPENDENT_AMBULATORY_CARE_PROVIDER_SITE_OTHER)

## 2015-03-18 DIAGNOSIS — J309 Allergic rhinitis, unspecified: Secondary | ICD-10-CM

## 2015-03-23 ENCOUNTER — Ambulatory Visit (INDEPENDENT_AMBULATORY_CARE_PROVIDER_SITE_OTHER)

## 2015-03-23 DIAGNOSIS — J309 Allergic rhinitis, unspecified: Secondary | ICD-10-CM

## 2015-03-25 ENCOUNTER — Ambulatory Visit (INDEPENDENT_AMBULATORY_CARE_PROVIDER_SITE_OTHER)

## 2015-03-25 ENCOUNTER — Ambulatory Visit (INDEPENDENT_AMBULATORY_CARE_PROVIDER_SITE_OTHER): Admitting: Psychiatry

## 2015-03-25 ENCOUNTER — Encounter (HOSPITAL_COMMUNITY): Payer: Self-pay | Admitting: Psychiatry

## 2015-03-25 VITALS — BP 103/66 | HR 78 | Ht 67.0 in | Wt 172.8 lb

## 2015-03-25 DIAGNOSIS — F900 Attention-deficit hyperactivity disorder, predominantly inattentive type: Secondary | ICD-10-CM

## 2015-03-25 DIAGNOSIS — F339 Major depressive disorder, recurrent, unspecified: Secondary | ICD-10-CM

## 2015-03-25 DIAGNOSIS — F411 Generalized anxiety disorder: Secondary | ICD-10-CM | POA: Diagnosis not present

## 2015-03-25 DIAGNOSIS — J309 Allergic rhinitis, unspecified: Secondary | ICD-10-CM | POA: Diagnosis not present

## 2015-03-25 DIAGNOSIS — F331 Major depressive disorder, recurrent, moderate: Secondary | ICD-10-CM

## 2015-03-25 MED ORDER — METHYLPHENIDATE HCL ER (OSM) 18 MG PO TBCR: 18.0000 mg | EXTENDED_RELEASE_TABLET | Freq: Two times a day (BID) | ORAL | Status: DC

## 2015-03-25 MED ORDER — ZOLPIDEM TARTRATE 10 MG PO TABS: 10.0000 mg | ORAL_TABLET | Freq: Every evening | ORAL | Status: DC | PRN

## 2015-03-25 MED ORDER — ESCITALOPRAM OXALATE 20 MG PO TABS: 20.0000 mg | ORAL_TABLET | Freq: Every day | ORAL | Status: DC
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
## 2015-03-30 ENCOUNTER — Ambulatory Visit (INDEPENDENT_AMBULATORY_CARE_PROVIDER_SITE_OTHER): Admitting: *Deleted

## 2015-03-30 DIAGNOSIS — J309 Allergic rhinitis, unspecified: Secondary | ICD-10-CM

## 2015-04-27 ENCOUNTER — Ambulatory Visit (INDEPENDENT_AMBULATORY_CARE_PROVIDER_SITE_OTHER)

## 2015-04-27 DIAGNOSIS — J309 Allergic rhinitis, unspecified: Secondary | ICD-10-CM | POA: Diagnosis not present

## 2015-04-28 ENCOUNTER — Telehealth (HOSPITAL_COMMUNITY): Payer: Self-pay

## 2015-04-28 NOTE — Telephone Encounter (Signed)
Telephone call with patient to follow up on her request for a new Concerta order for 90 days that she can mail to [facility] as she wants to start getting these delivered as a requirement of her insurance and reports they are much cheaper being mailed.  Questioned if patient still had her 30 day order that was to be filled on 04/25/15 or after and patient stated "I must have lost it".  Reported "it must be in my car or something".  Informed patient I could send request to Dr. Salem Senate but she may want the other prescription returned before she would be willing to write another one as this is a controlled stimulant prescription.  Patient stated she would try to find it and call back once located.  Agreed to send request to Dr. Salem Senate but warned it may be before the 05/25/15 evaluation that Dr. Salem Senate would be willing to write a new order as her current orders should last until that date.  Patient stated understanding and will call back once she looks for the missing 30 day order as states this is not at any pharmacy by chance.

## 2015-04-30 NOTE — Telephone Encounter (Signed)
Met with Dr. Salem Senate to discuss patient's lost 30 day order for Concerta but now requesting a 90 day order for her insurance to cover.  Informed patient on call this order would not be provided until after the next 30 days when due per Dr. Salem Senate and requested patient call back with any questions.

## 2015-05-03 ENCOUNTER — Ambulatory Visit (INDEPENDENT_AMBULATORY_CARE_PROVIDER_SITE_OTHER): Admitting: Neurology

## 2015-05-03 DIAGNOSIS — J309 Allergic rhinitis, unspecified: Secondary | ICD-10-CM | POA: Diagnosis not present

## 2015-05-25 ENCOUNTER — Ambulatory Visit (HOSPITAL_COMMUNITY): Payer: Self-pay | Admitting: Psychiatry

## 2015-05-26 ENCOUNTER — Ambulatory Visit (INDEPENDENT_AMBULATORY_CARE_PROVIDER_SITE_OTHER)

## 2015-05-26 DIAGNOSIS — J309 Allergic rhinitis, unspecified: Secondary | ICD-10-CM | POA: Diagnosis not present

## 2015-05-28 ENCOUNTER — Ambulatory Visit (INDEPENDENT_AMBULATORY_CARE_PROVIDER_SITE_OTHER): Admitting: Neurology

## 2015-05-28 DIAGNOSIS — J309 Allergic rhinitis, unspecified: Secondary | ICD-10-CM | POA: Diagnosis not present

## 2015-06-01 ENCOUNTER — Ambulatory Visit (INDEPENDENT_AMBULATORY_CARE_PROVIDER_SITE_OTHER)

## 2015-06-01 DIAGNOSIS — J309 Allergic rhinitis, unspecified: Secondary | ICD-10-CM

## 2015-06-02 ENCOUNTER — Ambulatory Visit (HOSPITAL_COMMUNITY): Payer: Self-pay | Admitting: Psychiatry

## 2015-06-04 ENCOUNTER — Ambulatory Visit (INDEPENDENT_AMBULATORY_CARE_PROVIDER_SITE_OTHER): Admitting: *Deleted

## 2015-06-04 DIAGNOSIS — J309 Allergic rhinitis, unspecified: Secondary | ICD-10-CM | POA: Diagnosis not present

## 2015-06-09 ENCOUNTER — Ambulatory Visit (INDEPENDENT_AMBULATORY_CARE_PROVIDER_SITE_OTHER)

## 2015-06-09 DIAGNOSIS — J309 Allergic rhinitis, unspecified: Secondary | ICD-10-CM | POA: Diagnosis not present

## 2015-06-10 ENCOUNTER — Ambulatory Visit (INDEPENDENT_AMBULATORY_CARE_PROVIDER_SITE_OTHER): Admitting: Psychiatry

## 2015-06-10 ENCOUNTER — Encounter (HOSPITAL_COMMUNITY): Payer: Self-pay | Admitting: Psychiatry

## 2015-06-10 VITALS — BP 118/78 | HR 100 | Ht 67.0 in | Wt 166.4 lb

## 2015-06-10 DIAGNOSIS — F902 Attention-deficit hyperactivity disorder, combined type: Secondary | ICD-10-CM | POA: Diagnosis not present

## 2015-06-10 DIAGNOSIS — F331 Major depressive disorder, recurrent, moderate: Secondary | ICD-10-CM

## 2015-06-10 DIAGNOSIS — F339 Major depressive disorder, recurrent, unspecified: Secondary | ICD-10-CM

## 2015-06-10 DIAGNOSIS — F411 Generalized anxiety disorder: Secondary | ICD-10-CM | POA: Diagnosis not present

## 2015-06-10 MED ORDER — ESCITALOPRAM OXALATE 20 MG PO TABS: 30.0000 mg | ORAL_TABLET | Freq: Every day | ORAL | Status: DC

## 2015-06-10 MED ORDER — CLONAZEPAM 0.5 MG PO TABS: 0.5000 mg | ORAL_TABLET | Freq: Every day | ORAL | Status: DC

## 2015-06-10 MED ORDER — METHYLPHENIDATE HCL ER (OSM) 18 MG PO TBCR: 18.0000 mg | EXTENDED_RELEASE_TABLET | Freq: Two times a day (BID) | ORAL | Status: DC

## 2015-06-10 MED ORDER — ZOLPIDEM TARTRATE 10 MG PO TABS: 10.0000 mg | ORAL_TABLET | Freq: Every evening | ORAL | Status: DC | PRN
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
## 2015-06-11 ENCOUNTER — Ambulatory Visit (INDEPENDENT_AMBULATORY_CARE_PROVIDER_SITE_OTHER): Admitting: *Deleted

## 2015-06-11 DIAGNOSIS — J309 Allergic rhinitis, unspecified: Secondary | ICD-10-CM | POA: Diagnosis not present

## 2015-06-14 ENCOUNTER — Other Ambulatory Visit: Payer: Self-pay | Admitting: Neurology

## 2015-06-14 MED ORDER — HYDROCORTISONE 2.5 % RE CREA: 1.0000 "application " | TOPICAL_CREAM | Freq: Two times a day (BID) | RECTAL | Status: DC

## 2015-06-15 ENCOUNTER — Ambulatory Visit (INDEPENDENT_AMBULATORY_CARE_PROVIDER_SITE_OTHER)

## 2015-06-15 DIAGNOSIS — J309 Allergic rhinitis, unspecified: Secondary | ICD-10-CM

## 2015-06-18 ENCOUNTER — Ambulatory Visit (INDEPENDENT_AMBULATORY_CARE_PROVIDER_SITE_OTHER): Admitting: Neurology

## 2015-06-18 DIAGNOSIS — J309 Allergic rhinitis, unspecified: Secondary | ICD-10-CM | POA: Diagnosis not present

## 2015-06-22 ENCOUNTER — Other Ambulatory Visit: Payer: Self-pay

## 2015-06-22 MED ORDER — HYDROCORTISONE 2.5 % EX CREA: TOPICAL_CREAM | Freq: Two times a day (BID) | CUTANEOUS | Status: DC

## 2015-06-23 ENCOUNTER — Ambulatory Visit (INDEPENDENT_AMBULATORY_CARE_PROVIDER_SITE_OTHER)

## 2015-06-23 DIAGNOSIS — J309 Allergic rhinitis, unspecified: Secondary | ICD-10-CM | POA: Diagnosis not present

## 2015-06-24 ENCOUNTER — Other Ambulatory Visit (INDEPENDENT_AMBULATORY_CARE_PROVIDER_SITE_OTHER)

## 2015-06-24 ENCOUNTER — Ambulatory Visit (INDEPENDENT_AMBULATORY_CARE_PROVIDER_SITE_OTHER): Admitting: Gastroenterology

## 2015-06-24 ENCOUNTER — Encounter: Payer: Self-pay | Admitting: Gastroenterology

## 2015-06-24 VITALS — BP 96/68 | HR 72 | Ht 67.0 in | Wt 165.8 lb

## 2015-06-24 DIAGNOSIS — R1310 Dysphagia, unspecified: Secondary | ICD-10-CM

## 2015-06-24 DIAGNOSIS — K3184 Gastroparesis: Secondary | ICD-10-CM

## 2015-06-24 DIAGNOSIS — K219 Gastro-esophageal reflux disease without esophagitis: Secondary | ICD-10-CM | POA: Diagnosis not present

## 2015-06-24 LAB — BASIC METABOLIC PANEL
BUN: 15 mg/dL (ref 6–23)
CALCIUM: 9.5 mg/dL (ref 8.4–10.5)
CO2: 26 mEq/L (ref 19–32)
Chloride: 104 mEq/L (ref 96–112)
Creatinine, Ser: 0.82 mg/dL (ref 0.40–1.20)
GFR: 92.93 mL/min (ref >60.00)
GLUCOSE: 95 mg/dL (ref 70–99)
Potassium: 4.7 mEq/L (ref 3.5–5.1)
SODIUM: 137 meq/L (ref 135–145)

## 2015-06-24 NOTE — Patient Instructions (Addendum)
Your physician has requested that you go to the basement for the following lab work before leaving today: BMET  We have sent medications to your pharmacy for you to pick up at your convenience.    You have been scheduled for an esophageal manometry at [facility] on 07/12/2015 at 12:30pm. Please arrive 30 minutes prior to your procedure for registration. You will need to go to outpatient registration (1st floor of the hospital) first. Make certain to bring your insurance cards as well as a complete list of medications.  Please remember the following:  1) Nothing to eat or drink after 12:00 midnight on the night before your test.  2) Hold all diabetic medications/insulin the morning of the test. You may eat and take your medications after the test.  3) For 3 days prior to your test do not take: Dexilant, Prevacid, Nexium, Protonix, Aciphex, Zegerid, Pantoprazole, Prilosec or omeprazole.  4) For 2 days prior to your test, do not take: Reglan, Tagamet, Zantac, Axid or Pepcid.  5) You MAY use an antacid such as Rolaids or Tums up to 12 hours prior to your test.  It will take at least 2 weeks to receive the results of this test from your physician. ------------------------------------------ ABOUT ESOPHAGEAL MANOMETRY Esophageal manometry (muh-NOM-uh-tree) is a test that gauges how well your esophagus works. Your esophagus is the long, muscular tube that connects your throat to your stomach. Esophageal manometry measures the rhythmic muscle contractions (peristalsis) that occur in your esophagus when you swallow. Esophageal manometry also measures the coordination and force exerted by the muscles of your esophagus.  During esophageal manometry, a thin, flexible tube (catheter) that contains sensors is passed through your nose, down your esophagus and into your stomach. Esophageal manometry can be helpful in diagnosing some mostly uncommon disorders that affect your esophagus.  Why it's  done Esophageal manometry is used to evaluate the movement (motility) of food through the esophagus and into the stomach. The test measures how well the circular bands of muscle (sphincters) at the top and bottom of your esophagus open and close, as well as the pressure, strength and pattern of the wave of esophageal muscle contractions that moves food along.  What you can expect Esophageal manometry is an outpatient procedure done without sedation. Most people tolerate it well. You may be asked to change into a hospital gown before the test starts.  During esophageal manometry  While you are sitting up, a member of your health care team sprays your throat with a numbing medication or puts numbing gel in your nose or both.  A catheter is guided through your nose into your esophagus. The catheter may be sheathed in a water-filled sleeve. It doesn't interfere with your breathing. However, your eyes may water, and you may gag. You may have a slight nosebleed from irritation.  After the catheter is in place, you may be asked to lie on your back on an exam table, or you may be asked to remain seated.  You then swallow small sips of water. As you do, a computer connected to the catheter records the pressure, strength and pattern of your esophageal muscle contractions.  During the test, you'll be asked to breathe slowly and smoothly, remain as still as possible, and swallow only when you're asked to do so.  A member of your health care team may move the catheter down into your stomach while the catheter continues its measurements.  The catheter then is slowly withdrawn. The test  usually lasts 20 to 30 minutes.  After esophageal manometry  When your esophageal manometry is complete, you may return to your normal activities  This test typically takes 30-45 minutes to complete. ________________________________________________________________________________

## 2015-06-24 NOTE — Progress Notes (Signed)
HPI :  20 y/o female here for follow up for reflux and gastroparesis. Former patient of Dr. Deatra Ina, new to me  She has had pyrosis and regurgitation since she was 20 years old. She has been on PPI for a long time for this. She has been on Dexilant for at least 4 years for these symptoms. She has been on other PPIs such as nexium in the past. Dexilant has worked the best for her at 60mg . Prior dosing at 30mg  didn't work too well. If she takes her medication every day her symptoms are fairly well controlled and she generally does ok. If she eats too much she will have worsening symptoms. Symptoms mostly during the day, and mostly after eating. She also has a lot of belching with after eating. Sometime she has upper abdominal pain after she eats as well. She does endorse periodic dysphagia to solids and pills. She will have dysphagia once or twice per day. She denies dysphagia to liquids. No odynophagia. She requested a surgical evaluation about a month ago for reflux, she was told by surgery she was not good a candidate for it given gastroparesis.   She has had a history of idiopathic gastroparesis confirmed on prior gastric emptying study. She has been on reglan in the past but did not notice any major changes. She has not been on domperidone before.   She has been on linzess recently otherwise for constipation and did not like the way it made her feel.  She denies constipation problems at this time, taking magnesium which is helping with this and denies problems with constipation.    EGD 07/26/12 - mild esophagitis, otherwise normal Gastric emptying study - 12/05/13 - gastroparesis     Past Medical History  Diagnosis Date  . Pulmonary artery stenosis     left  . Acne   . GERD (gastroesophageal reflux disease)   . Complication of anesthesia     wakes up combative  . Cluster headaches   . Biliary dyskinesia 11/2013    symptomatic  . Chronic constipation   . Muscle spasms of neck    receives PT; needs to have neck support during surgery, per mother  . Closed head injury 2012  . Delayed gastric emptying   . Heart murmur      Past Surgical History  Procedure Laterality Date  . Hymenectomy  12/21/2006  . Laparoscopic appendectomy N/A 09/28/2013    Procedure: APPENDECTOMY LAPAROSCOPIC;  Surgeon: Gwenyth Ober, MD;  Location: [facility];  Service: General;  Laterality: N/A;  . Robotic assisted laparoscopic ovarian cystectomy Right 01/20/2011  . Esophagogastroduodenoscopy (egd) with propofol  07/26/2012  . Nasal septum surgery  2009    x 2  . Laparoscopic cholecystectomy  12/09/2013  . Tonsillectomy and adenoidectomy  1999  . Appendectomy    . Cholecystectomy N/A 12/09/2013    Procedure: LAPAROSCOPIC CHOLECYSTECTOMY WITH INTRAOPERATIVE CHOLANGIOGRAM;  Surgeon: Gwenyth Ober, MD;  Location: [facility];  Service: General;  Laterality: N/A;   Family History  Problem Relation Age of Onset  . Colon cancer Maternal Grandmother   . Other Maternal Grandfather     pituitary tumor  . Diabetes Paternal Grandmother   . Heart Problems Paternal Grandfather     A fib, CHF   Social History  Substance Use Topics  . Smoking status: Never Smoker   . Smokeless tobacco: Never Used  . Alcohol Use: No   Current Outpatient Prescriptions  Medication Sig Dispense Refill  . cetirizine (  ZYRTEC) 10 MG tablet Take 10 mg by mouth at bedtime.     . clonazePAM (KLONOPIN) 0.5 MG tablet Take 1 tablet (0.5 mg total) by mouth at bedtime. 30 tablet 0  . dexlansoprazole (DEXILANT) 60 MG capsule Take 1 capsule (60 mg total) by mouth daily. 90 capsule 3  . EPINEPHrine (EPIPEN 2-PAK) 0.3 mg/0.3 mL IJ SOAJ injection Inject 0.3 mg into the muscle as needed.    Marland Kitchen escitalopram (LEXAPRO) 20 MG tablet Take 1.5 tablets (30 mg total) by mouth daily. 135 tablet 0  . gabapentin (NEURONTIN) 300 MG capsule Take 300 mg by mouth at bedtime.    . hydrocortisone (ANUSOL-HC) 2.5 % rectal cream Place 1 application rectally 2 (two)  times daily. 30 g 1  . hydrocortisone 2.5 % cream Apply topically 2 (two) times daily. 30 g 0  . hyoscyamine (LEVSIN/SL) 0.125 MG SL tablet Place 1 tablet (0.125 mg total) under the tongue every 4 (four) hours as needed. 30 tablet 4  . magnesium oxide (MAG-OX) 400 MG tablet Take 800 mg by mouth daily.    . methylphenidate 18 MG PO CR tablet Take 1 tablet (18 mg total) by mouth 2 (two) times daily with breakfast and lunch. 180 tablet 0  . ondansetron (ZOFRAN) 4 MG tablet Take 1 tablet (4 mg total) by mouth every 6 (six) hours as needed for nausea. 20 tablet 0  . tiZANidine (ZANAFLEX) 4 MG tablet TAKE 1 TABLET TWICE A DAY AS NEEDED (SCHEDULE APPOINTMENT) 60 tablet 0  . zolpidem (AMBIEN) 10 MG tablet Take 1 tablet (10 mg total) by mouth at bedtime as needed for sleep. 90 tablet 0   No current facility-administered medications for this visit.   Allergies  Allergen Reactions  . Cyanoacrylate Rash    Component of dermabond  . Clavulanic Acid Nausea And Vomiting    Pt can have penicillins  . Compazine [Prochlorperazine Edisylate] Other (See Comments)    COMBATIVE AND BELLIGERENT  . Ketamine Rash and Other (See Comments)    HALLUCINATIONS AND COMBATIVE  . Topamax [Topiramate] Hives and Rash    COMBATIVE  . Adhesive [Tape] Rash    BLISTERS  . Flagyl [Metronidazole] Rash  . Other Rash    Monocryl     Review of Systems: All systems reviewed and negative except where noted in HPI.     Lab Results  Component Value Date   CREATININE 0.82 06/24/2015   BUN 15 06/24/2015   NA 137 06/24/2015   K 4.7 06/24/2015   CL 104 06/24/2015   CO2 26 06/24/2015     Physical Exam: BP 96/68 mmHg  Pulse 72  Ht 5\' 7"  (1.702 m)  Wt 165 lb 12.8 oz (75.206 kg)  BMI 25.96 kg/m2  LMP 05/31/2015 (Approximate) Constitutional: Pleasant,well-developed, female in no acute distress. HEENT: Normocephalic and atraumatic. Conjunctivae are normal. No scleral icterus. Neck supple.  Cardiovascular: Normal  rate, regular rhythm.  Pulmonary/chest: Effort normal and breath sounds normal. No wheezing, rales or rhonchi. Abdominal: Soft, nondistended, nontender. Bowel sounds active throughout. There are no masses palpable. No hepatomegaly. Extremities: no edema Lymphadenopathy: No cervical adenopathy noted. Neurological: Alert and oriented to person place and time. Skin: Skin is warm and dry. No rashes noted. Psychiatric: Normal mood and affect. Behavior is normal.   ASSESSMENT AND PLAN: 20 y/o female here for follow up for the issues as outlined below:  GERD - on chronic high dose Dexilant which has worked better than any other regimen she has been on  and she generally feels well while on it. She requested a surgical evaluation previously and I would agree she is a poor candidate for a reflux surgery given her gastroparesis. I further suspect her gastroparesis may be driving her reflux. At this time, given symptoms are fairly well controlled on Dexilant I would continue it. I discussed the risks of long term PPI with the patient and her mother today at length as they have concerns about long term therapy of PPI. The risks of long term PPIs with current data include increased risk for chronic kidney disease, increased risk of fracture, increased risk of C diff, increased risk of pneumonia (short term usage), potentially increased risk of dementia, potentially increased risk of B12 / calcium deficiency, and rare risk of hypomagnesemia. Recent studies have also shown an association with increased risk of cardiovascular outcomes including stroke. These studies have showed an association between PPIs and several of these outcomes but no evidence of causality. The patient was counseled to use the lowest daily use of PPI needed to control symptoms or come off it if possible. Apparently the lowest dose to control her symptoms at present time is Dexilant 60mg . She understands the risks of the medications, but feels  terrible off it and is not a good surgical candidate. We check renal function today and is normal, would recommend checking this yearly while on PPI therapy.   Gastroparesis - she did not not help and after discussion of Domperidone which is a reasonable option, she declined therapy for it. Recommend small more frequent meals as she has been doing for now. If she has a change of heart about Domperidone can can let me know.   Dysphagia - prior EGDs have not shown an etiology. I offered her manometry to rule out dysmotility, which she is at increased risk for with her gastroparesis. She wanted to have this done to further clarify what is causing her symptoms. She also asked for a 24 HR pH test. While I don't think this will change her management she strongly wished to have the pH test done. We will do it on Dexilant. I will let them know the results. They agreed with the plan.   [place], MD [facility] Pager [phone]

## 2015-06-25 ENCOUNTER — Encounter (HOSPITAL_COMMUNITY): Admission: RE | Payer: Self-pay | Source: Ambulatory Visit

## 2015-06-25 SURGERY — MANOMETRY, ESOPHAGUS

## 2015-06-29 ENCOUNTER — Ambulatory Visit (INDEPENDENT_AMBULATORY_CARE_PROVIDER_SITE_OTHER): Admitting: Psychiatry

## 2015-06-29 ENCOUNTER — Telehealth: Payer: Self-pay

## 2015-06-29 ENCOUNTER — Encounter (HOSPITAL_COMMUNITY): Payer: Self-pay | Admitting: Psychiatry

## 2015-06-29 VITALS — BP 104/62 | HR 82 | Ht 67.0 in | Wt 166.4 lb

## 2015-06-29 DIAGNOSIS — F902 Attention-deficit hyperactivity disorder, combined type: Secondary | ICD-10-CM

## 2015-06-29 DIAGNOSIS — F411 Generalized anxiety disorder: Secondary | ICD-10-CM | POA: Diagnosis not present

## 2015-06-29 DIAGNOSIS — F339 Major depressive disorder, recurrent, unspecified: Secondary | ICD-10-CM | POA: Diagnosis not present

## 2015-06-29 MED ORDER — CLONAZEPAM 0.5 MG PO TABS: 0.5000 mg | ORAL_TABLET | Freq: Every day | ORAL | Status: DC

## 2015-06-29 NOTE — Telephone Encounter (Signed)
Called pt to inform her that her Manometry was scheduled for 07/12/2015 at 12:30pm.  Left vm for pt to call back.

## 2015-06-29 NOTE — Telephone Encounter (Signed)
Pt called back. I informed her of her appointment time and date and gave her Manometry instructions. Pt understands.
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
## 2015-07-01 ENCOUNTER — Ambulatory Visit (HOSPITAL_COMMUNITY): Admission: RE | Admit: 2015-07-01 | Source: Ambulatory Visit | Admitting: Gastroenterology

## 2015-07-06 ENCOUNTER — Ambulatory Visit (INDEPENDENT_AMBULATORY_CARE_PROVIDER_SITE_OTHER)

## 2015-07-06 DIAGNOSIS — J309 Allergic rhinitis, unspecified: Secondary | ICD-10-CM

## 2015-07-08 ENCOUNTER — Ambulatory Visit (INDEPENDENT_AMBULATORY_CARE_PROVIDER_SITE_OTHER)

## 2015-07-08 DIAGNOSIS — J309 Allergic rhinitis, unspecified: Secondary | ICD-10-CM

## 2015-07-12 ENCOUNTER — Telehealth: Payer: Self-pay | Admitting: Gastroenterology

## 2015-07-12 ENCOUNTER — Ambulatory Visit (INDEPENDENT_AMBULATORY_CARE_PROVIDER_SITE_OTHER)

## 2015-07-12 DIAGNOSIS — J309 Allergic rhinitis, unspecified: Secondary | ICD-10-CM | POA: Diagnosis not present

## 2015-07-12 NOTE — Telephone Encounter (Signed)
Spoke with patient and told her we will need to reschedule.

## 2015-07-12 NOTE — Telephone Encounter (Signed)
Spoke with Endo and rescheduled patient on 07/19/15 at 10:30 AM.  Dr. Havery Moros, your OV note states she will do the [facility] study on [place] but patient was instructed to stop it 3 days prior. Which should the patient be doing?

## 2015-07-12 NOTE — Telephone Encounter (Signed)
I would like to perform her pH study while ON Dexilant so please advise her to continue it. Thanks much

## 2015-07-13 NOTE — Telephone Encounter (Signed)
Left a message for patient to call back. 

## 2015-07-13 NOTE — Telephone Encounter (Signed)
Patient given appointment and instructions to stay on Dexilant.

## 2015-07-14 ENCOUNTER — Ambulatory Visit (INDEPENDENT_AMBULATORY_CARE_PROVIDER_SITE_OTHER)

## 2015-07-14 ENCOUNTER — Other Ambulatory Visit: Payer: Self-pay | Admitting: Allergy and Immunology

## 2015-07-14 DIAGNOSIS — J309 Allergic rhinitis, unspecified: Secondary | ICD-10-CM | POA: Diagnosis not present

## 2015-07-19 ENCOUNTER — Telehealth: Payer: Self-pay | Admitting: Gastroenterology

## 2015-07-19 NOTE — Telephone Encounter (Signed)
Rescheduled with Jill to 07/26/15 at 10:30 AM. Patient's mother aware.

## 2015-07-19 NOTE — Telephone Encounter (Signed)
Patient ate breakfast this AM. Told patient's mother she will need to reschedule.

## 2015-07-21 ENCOUNTER — Encounter (HOSPITAL_COMMUNITY): Payer: Self-pay | Admitting: Psychiatry

## 2015-07-21 ENCOUNTER — Ambulatory Visit (INDEPENDENT_AMBULATORY_CARE_PROVIDER_SITE_OTHER): Admitting: Psychiatry

## 2015-07-21 VITALS — BP 108/64 | HR 97 | Ht 67.0 in | Wt 163.4 lb

## 2015-07-21 DIAGNOSIS — F411 Generalized anxiety disorder: Secondary | ICD-10-CM

## 2015-07-21 DIAGNOSIS — F339 Major depressive disorder, recurrent, unspecified: Secondary | ICD-10-CM | POA: Diagnosis not present

## 2015-07-21 DIAGNOSIS — F902 Attention-deficit hyperactivity disorder, combined type: Secondary | ICD-10-CM

## 2015-07-21 MED ORDER — DEXMETHYLPHENIDATE HCL 2.5 MG PO TABS: 2.5000 mg | ORAL_TABLET | Freq: Every day | ORAL | Status: DC

## 2015-07-21 MED ORDER — ZOLPIDEM TARTRATE 10 MG PO TABS
10.0000 mg | ORAL_TABLET | Freq: Every evening | ORAL | Status: DC | PRN
Start: 2015-07-21 — End: 2015-09-20
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
## 2015-07-26 ENCOUNTER — Ambulatory Visit (HOSPITAL_COMMUNITY)
Admission: RE | Admit: 2015-07-26 | Discharge: 2015-07-26 | Disposition: A | Discharge status: Home / Self Care | Source: Ambulatory Visit | Attending: Gastroenterology | Admitting: Gastroenterology

## 2015-07-26 ENCOUNTER — Encounter (HOSPITAL_COMMUNITY)
Admission: RE | Disposition: A | Discharge status: Home / Self Care | Payer: Self-pay | Source: Ambulatory Visit | Attending: Gastroenterology

## 2015-07-26 DIAGNOSIS — R1310 Dysphagia, unspecified: Secondary | ICD-10-CM | POA: Diagnosis present

## 2015-07-26 DIAGNOSIS — K219 Gastro-esophageal reflux disease without esophagitis: Secondary | ICD-10-CM | POA: Diagnosis not present

## 2015-07-26 HISTORY — PX: 24 HOUR PH STUDY: SHX5419

## 2015-07-26 HISTORY — PX: ESOPHAGEAL MANOMETRY: SHX5429

## 2015-07-26 SURGERY — MANOMETRY, ESOPHAGUS

## 2015-07-26 MED ORDER — LIDOCAINE VISCOUS 2 % MT SOLN
OROMUCOSAL | Status: AC
  Filled 2015-07-26: qty 15

## 2015-07-26 SURGICAL SUPPLY — 2 items
FACESHIELD LNG OPTICON STERILE (SAFETY) IMPLANT
GLOVE BIO SURGEON STRL SZ8 (GLOVE) ×4 IMPLANT

## 2015-07-26 NOTE — Progress Notes (Signed)
Esophageal Manometry done per protocol.  Pt tolerated fairly well with some gagging and spitting. No complications noted. Ph impedance probe calibrated and placed at 32cm after placing probe in stomach with Ph drop showing in the stomach. Pt and mother taught how to use  Ph monitor and instructed on when to return the next day 07/27/15 atfter 12:00noon. Teach back used and pt and mother voiced understanding.

## 2015-07-27 ENCOUNTER — Encounter (HOSPITAL_COMMUNITY): Payer: Self-pay | Admitting: Gastroenterology

## 2015-08-02 ENCOUNTER — Ambulatory Visit (INDEPENDENT_AMBULATORY_CARE_PROVIDER_SITE_OTHER)

## 2015-08-02 DIAGNOSIS — J309 Allergic rhinitis, unspecified: Secondary | ICD-10-CM | POA: Diagnosis not present

## 2015-08-09 ENCOUNTER — Telehealth: Payer: Self-pay | Admitting: Gastroenterology

## 2015-08-09 DIAGNOSIS — K219 Gastro-esophageal reflux disease without esophagitis: Secondary | ICD-10-CM | POA: Insufficient documentation

## 2015-08-09 DIAGNOSIS — R1310 Dysphagia, unspecified: Secondary | ICD-10-CM | POA: Insufficient documentation

## 2015-08-09 NOTE — Telephone Encounter (Signed)
Called and left a message, no answer, will call again

## 2015-08-09 NOTE — Telephone Encounter (Signed)
Patient had a 24 HR pH impedance study done for symptoms of reflux, while on PPI, and esophageal manometry given symptoms of dysphagia.   24 HR pH impedance test was a normal study ON PPI which indicates good control of acid reflux on her present regimen, which she should continue.  Her manometry was otherwise an abnormal study, with 100% of swallows failed. There was absent peristalsis on [place] classification, with a normal IRP. Overall, no evidence of achalasia but she has significant dysmotility with absent peristalsis causing her symptoms. Given this finding and her history of gastroparesis, recommend a Rheumatology evaluation for scleroderma or other collage vascular diseases which could present like this.

## 2015-08-10 NOTE — Telephone Encounter (Signed)
Called patient and reviewed results of manometry. She has aperistalsis of the esophagus which I believe is driving her symptoms. I discussed this with her. Given this finding and her gastroparesis, recommend a Rheumatology consults to rule out scleroderma or collagen vascular disorders which could present like this. On ROS she otherwise does endorse some joint pains.  Rollene Fare, can you please help coordinate a Rheumatology consult for this patient? Thanks very much.

## 2015-08-10 NOTE — Telephone Encounter (Signed)
Patient left message with answering service on 08/09/15 at 6pm stating that she was returning your phone call. Phone # she was calling from [phone]

## 2015-08-11 NOTE — Telephone Encounter (Signed)
Spoke with patient's mother and she will call me with a Rheumatologist that accepts [employer] and call me back.

## 2015-08-18 ENCOUNTER — Ambulatory Visit (INDEPENDENT_AMBULATORY_CARE_PROVIDER_SITE_OTHER)

## 2015-08-18 DIAGNOSIS — J309 Allergic rhinitis, unspecified: Secondary | ICD-10-CM

## 2015-08-20 ENCOUNTER — Ambulatory Visit (INDEPENDENT_AMBULATORY_CARE_PROVIDER_SITE_OTHER)

## 2015-08-20 DIAGNOSIS — J309 Allergic rhinitis, unspecified: Secondary | ICD-10-CM | POA: Diagnosis not present

## 2015-09-07 NOTE — Telephone Encounter (Signed)
Patient mom states that Dr. Hermelinda Medicus with [facility] and [facility] take [employer]. Patient mom states no preference. Best # [phone] to reach patient mom at .

## 2015-09-08 NOTE — Telephone Encounter (Signed)
Called Dr. Genevie Cheshire office and scheduled OV on 10/29/15 at 2:45 PM. [phone]).  Arrive at 2:15PM. Faxed records to [phone].

## 2015-09-08 NOTE — Telephone Encounter (Signed)
Patient's mother given appointment date and time.

## 2015-09-20 ENCOUNTER — Encounter (HOSPITAL_COMMUNITY): Payer: Self-pay | Admitting: Psychiatry

## 2015-09-20 ENCOUNTER — Other Ambulatory Visit: Payer: Self-pay | Admitting: Allergy and Immunology

## 2015-09-20 ENCOUNTER — Ambulatory Visit (INDEPENDENT_AMBULATORY_CARE_PROVIDER_SITE_OTHER): Admitting: Psychiatry

## 2015-09-20 ENCOUNTER — Other Ambulatory Visit (HOSPITAL_COMMUNITY): Payer: Self-pay | Admitting: Psychiatry

## 2015-09-20 VITALS — BP 118/68 | HR 78 | Ht 67.0 in | Wt 161.4 lb

## 2015-09-20 DIAGNOSIS — F339 Major depressive disorder, recurrent, unspecified: Secondary | ICD-10-CM | POA: Diagnosis not present

## 2015-09-20 DIAGNOSIS — F411 Generalized anxiety disorder: Secondary | ICD-10-CM

## 2015-09-20 DIAGNOSIS — F902 Attention-deficit hyperactivity disorder, combined type: Secondary | ICD-10-CM

## 2015-09-20 DIAGNOSIS — F331 Major depressive disorder, recurrent, moderate: Secondary | ICD-10-CM

## 2015-09-20 MED ORDER — ZOLPIDEM TARTRATE 10 MG PO TABS
10.0000 mg | ORAL_TABLET | Freq: Every evening | ORAL | Status: DC | PRN
Start: 2015-09-20 — End: 2015-10-20

## 2015-09-20 MED ORDER — ESCITALOPRAM OXALATE 20 MG PO TABS: 30.0000 mg | ORAL_TABLET | Freq: Every day | ORAL | Status: DC

## 2015-09-20 MED ORDER — METHYLPHENIDATE HCL ER (OSM) 18 MG PO TBCR: 18.0000 mg | EXTENDED_RELEASE_TABLET | Freq: Two times a day (BID) | ORAL | Status: DC
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
## 2015-10-05 ENCOUNTER — Telehealth: Payer: Self-pay | Admitting: Diagnostic Neuroimaging

## 2015-10-05 NOTE — Telephone Encounter (Addendum)
Mother Santiago Glad called to request all medical records including imaging if any, be sent to Dr. Allen Derry, [place]. Fax to [phone] Attn Kim or Junie Panning.

## 2015-10-06 ENCOUNTER — Ambulatory Visit (INDEPENDENT_AMBULATORY_CARE_PROVIDER_SITE_OTHER)

## 2015-10-06 ENCOUNTER — Telehealth: Payer: Self-pay | Admitting: Diagnostic Neuroimaging

## 2015-10-06 DIAGNOSIS — J309 Allergic rhinitis, unspecified: Secondary | ICD-10-CM | POA: Diagnosis not present

## 2015-10-06 NOTE — Telephone Encounter (Signed)
Pt's mother called requesting records during the time Dr. Gaynell Face was with [facility]. Please call to discuss with  Mother, Alvera Ransier, [phone]

## 2015-10-14 DIAGNOSIS — M542 Cervicalgia: Secondary | ICD-10-CM | POA: Insufficient documentation

## 2015-10-14 DIAGNOSIS — G894 Chronic pain syndrome: Secondary | ICD-10-CM | POA: Insufficient documentation

## 2015-10-20 ENCOUNTER — Ambulatory Visit (INDEPENDENT_AMBULATORY_CARE_PROVIDER_SITE_OTHER): Admitting: Psychiatry

## 2015-10-20 ENCOUNTER — Encounter (HOSPITAL_COMMUNITY): Payer: Self-pay | Admitting: Psychiatry

## 2015-10-20 VITALS — BP 110/70 | HR 94 | Ht 67.0 in | Wt 156.2 lb

## 2015-10-20 DIAGNOSIS — F988 Other specified behavioral and emotional disorders with onset usually occurring in childhood and adolescence: Secondary | ICD-10-CM

## 2015-10-20 DIAGNOSIS — F331 Major depressive disorder, recurrent, moderate: Secondary | ICD-10-CM

## 2015-10-20 DIAGNOSIS — F909 Attention-deficit hyperactivity disorder, unspecified type: Secondary | ICD-10-CM

## 2015-10-20 MED ORDER — ZOLPIDEM TARTRATE 5 MG PO TABS: 5.0000 mg | ORAL_TABLET | Freq: Every evening | ORAL | Status: DC | PRN

## 2015-10-20 MED ORDER — METHYLPHENIDATE HCL ER (OSM) 18 MG PO TBCR: 18.0000 mg | EXTENDED_RELEASE_TABLET | Freq: Two times a day (BID) | ORAL | Status: DC

## 2015-10-20 NOTE — Progress Notes (Signed)
BH H M.D. progress note  Patient Identification: Angela [name] MRN:  [id number]  Date of Service :  10/20/2015  Chief complaint  I'm taking medicine for ADD, anxiety and depression.  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  This is the first time I'm seeing this patient.  Patient has seen Dr. Gildardo Griffes in the past for the management of anxiety depression and ADD symptoms.  She is taking Concerta 18 mg twice a day , Ambien 5 mg at bedtime and Lexapro 30 mg daily.  She also have headaches and she has been seen multiple doctors for her headaches.  She had tried numerous medications for her headaches and now she is taking Topamax 100 mg.  She reported that Topamax causing some time memory problem, poor attention and concentration.  She admitted feeling some time foggy in the morning.  However she feels her depression and ADD symptoms are under control.  She recently finished her semester .  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  She feels Concerta helps her multitasking and she has able to improve the grades.  She is looking for a summer job.  She denies any anger issues, irritability, crying spells or any feeling hopelessness or worthlessness.  She has no side effects including any tremors shakes or any EPS.  She denies any paranoia or any hallucination.  She is frustrated with the headaches.  She does not want to change her medication.  She started seeing therapy with Ashby Dawes and she feel it is working very well.  Patient denies drinking or using any illegal substances.  Her appetite is okay.  Her vitals are stable.  Past psychiatric history.   Patient has history of ADD depression and anxiety since starting school.  She has ADD symptoms in the school and she had psychological testing by psychologist Greggory Brandy.  She has seen primary care physician for the measurement of ADD.  She has taken Remeron  and amitriptyline in the past.  She also tried Focalin but it makes her more angry.  Patient denies any history of paranoia, hallucination, mania, psychosis, suicidal attempt or any inpatient psychiatric treatment.  Substance abuse history;  Patient denies using cigarettes, alcohol and marijuana or any other drugs.   Past Medical History: Migraine headaches. Patient had 2 concussions at the ages of 1-1/2 year and 1 years respectively. She also had a frontal bone fracture at the age of 52.  she had extended workup for her headaches including EEG .  She is seeing a neurologist at [facility] neurology and at [facility].  She also see a pain specialist.  In the past she had taken verapamil, Depakote, Inderal, amitriptyline for the headaches.     Past Medical History  Diagnosis Date  . Pulmonary artery stenosis     left  . Acne   . GERD (gastroesophageal reflux disease)   . Complication of anesthesia     wakes up combative  . Cluster headaches   . Biliary dyskinesia 11/2013    symptomatic  . Chronic constipation   . Muscle spasms of neck     receives PT; needs to have neck support during surgery, per mother  . Closed head injury 2012  . Delayed gastric emptying   . Heart murmur     Past Surgical History  Procedure Laterality Date  . Hymenectomy  12/21/2006  . Laparoscopic appendectomy N/A 09/28/2013  Procedure: APPENDECTOMY LAPAROSCOPIC;  Surgeon: Gwenyth Ober, MD;  Location: [facility];  Service: General;  Laterality: N/A;  . Robotic assisted laparoscopic ovarian cystectomy Right 01/20/2011  . Esophagogastroduodenoscopy (egd) with propofol  07/26/2012  . Nasal septum surgery  2009    x 2  . Laparoscopic cholecystectomy  12/09/2013  . Tonsillectomy and adenoidectomy  1999  . Appendectomy    . Cholecystectomy N/A 12/09/2013    Procedure: LAPAROSCOPIC CHOLECYSTECTOMY WITH INTRAOPERATIVE CHOLANGIOGRAM;  Surgeon: Gwenyth Ober, MD;  Location: [facility];  Service: General;  Laterality: N/A;  .  Esophageal manometry N/A 07/26/2015    Procedure: ESOPHAGEAL MANOMETRY (EM);  Surgeon: Manus Gunning, MD;  Location: [facility];  Service: Gastroenterology;  Laterality: N/A;  . 24 hour ph study N/A 07/26/2015    Procedure: 24 HOUR PH STUDY;  Surgeon: Manus Gunning, MD;  Location: [facility];  Service: Gastroenterology;  Laterality: N/A;   Family History:  Maternal grandmother has anxiety, paternal grandmother has depression, paternal great grandfather was alcoholic, brother and dad have ADHD. Dad has PTSD has is a veteran from [place] and [place] Family History  Problem Relation Age of Onset  . Colon cancer Maternal Grandmother   . Other Maternal Grandfather     pituitary tumor  . Diabetes Paternal Grandmother   . Heart Problems Paternal Grandfather     A fib, CHF   Social History:  Patient was born in [place] and raised in [place]. She lives with her parents and 97 year old brother in [place].  Mom reports that her pregnancy was complicated by hyperemesis and she lost about 28 pounds. Patient had IUGR at birth and was born at 35-1/2 weeks. Mom had a C-section because patient was a breech she was in the NICU for 2-1/2 weeks because of breathing problems. Subsequently went home and her milestones were normal. Elementary school was good middle school was okay she was diagnosed with ADHD in high school by pediatric neurodevelopmental person Dr. Francee Gentile  Musculoskeletal: Strength & Muscle Tone: within normal limits Gait & Station: normal Patient leans: Stand straight  Psychiatric Specialty Exam: Anxiety Symptoms include insomnia. Patient reports no chest pain or palpitations.    Depression        Associated symptoms include insomnia and headaches.  Past medical history includes anxiety.     Review of Systems  Respiratory: Negative for stridor.   Cardiovascular: Negative for chest pain and palpitations.  Gastrointestinal: Negative for heartburn.   Musculoskeletal: Negative.   Skin: Negative for itching and rash.  Neurological: Positive for headaches.  Psychiatric/Behavioral: The patient has insomnia.     Blood pressure 110/70, pulse 94, height 5\' 7"  (1.702 m), weight 156 lb 3.2 oz (70.852 kg).Body mass index is 24.46 kg/(m^2).  General Appearance: Casual  Eye Contact:  Good  Speech:  Clear and Coherent, Normal Rate and Slow  Volume:  Normal   Mood:  Anxious secondary headaches   Affect:  Constricted   Thought Process:  Goal Directed, Linear and Logical  Orientation:  Full (Time, Place, and Person)  Thought Content:  WDL and Rumination  Suicidal Thoughts:  No  Homicidal Thoughts:  No  Memory:  Immediate;   Good Recent;   Good Remote;   Good  Judgement:  Good  Insight:  Present  Psychomotor Activity:  Normal  Concentration:  Fair  Recall:  Good  Fund of Knowledge:Good  Language: Good  Akathisia:  No  Handed:  Right  AIMS (if indicated):  0  Assets:  Communication  Skills Desire for Improvement Financial Resources/Insurance Housing Resilience Social Support Transportation  ADL's:  Intact  Cognition: WNL  Sleep:  Fair    Allergies:   Allergies  Allergen Reactions  . Cyanoacrylate Rash    Component of dermabond  . Clavulanic Acid Nausea And Vomiting    Pt can have penicillins  . Compazine [Prochlorperazine Edisylate] Other (See Comments)    COMBATIVE AND BELLIGERENT  . Ketamine Rash and Other (See Comments)    HALLUCINATIONS AND COMBATIVE  . Topamax [Topiramate] Hives and Rash    COMBATIVE  . Adhesive [Tape] Rash    BLISTERS  . Flagyl [Metronidazole] Rash  . Other Rash    Monocryl   Current Medications: Current Outpatient Prescriptions  Medication Sig Dispense Refill  . cetirizine (ZYRTEC) 10 MG tablet Take 10 mg by mouth at bedtime.     Marland Kitchen dexlansoprazole (DEXILANT) 60 MG capsule Take 1 capsule (60 mg total) by mouth daily. 90 capsule 3  . EPINEPHrine (EPIPEN 2-PAK) 0.3 mg/0.3 mL IJ SOAJ injection  Inject 0.3 mg into the muscle as needed.    Marland Kitchen escitalopram (LEXAPRO) 20 MG tablet Take 1.5 tablets (30 mg total) by mouth daily. 135 tablet 0  . hyoscyamine (LEVSIN/SL) 0.125 MG SL tablet Place 1 tablet (0.125 mg total) under the tongue every 4 (four) hours as needed. 30 tablet 4  . magnesium oxide (MAG-OX) 400 MG tablet Take 800 mg by mouth daily.    . methylphenidate (CONCERTA) 18 MG PO CR tablet Take 1 tablet (18 mg total) by mouth 2 times daily at 12 noon and 4 pm. 60 tablet 0  . ondansetron (ZOFRAN) 4 MG tablet Take 1 tablet (4 mg total) by mouth every 6 (six) hours as needed for nausea. 20 tablet 0  . tiZANidine (ZANAFLEX) 4 MG capsule 4 mg.    Marland Kitchen tiZANidine (ZANAFLEX) 4 MG tablet TAKE 1 TABLET TWICE A DAY AS NEEDED (SCHEDULE APPOINTMENT) 60 tablet 0  . topiramate (TOPAMAX) 25 MG tablet 100 mg.  0  . TRI-LINYAH 0.18/0.215/0.25 MG-35 MCG tablet TAKE 1 TABLET BY MOUTH DAILY CONTINUOUSLY  0  . zolpidem (AMBIEN) 5 MG tablet Take 1 tablet (5 mg total) by mouth at bedtime as needed for sleep. 90 tablet 0   No current facility-administered medications for this visit.    Established Problem, Stable/Improving (1), Review of Psycho-Social Stressors (1), Review or order clinical lab tests (1), Decision to obtain old records (1), Review of Last Therapy Session (1) and Review of Medication Regimen & Side Effects (2)  Assessment ' Axis I ; major depressive disorder, recurrent.  Attention deficit disorder   Axis II ; deferred  Axis III; Past Medical History  Diagnosis Date  . Pulmonary artery stenosis     left  . Acne   . GERD (gastroesophageal reflux disease)   . Complication of anesthesia     wakes up combative  . Cluster headaches   . Biliary dyskinesia 11/2013    symptomatic  . Chronic constipation   . Muscle spasms of neck     receives PT; needs to have neck support during surgery, per mother  . Closed head injury 2012  . Delayed gastric emptying   . Heart murmur     Plan I  review her symptoms, history, collateral information, current medication and recent blood work results from [facility].  She is doing better on Lexapro 30 mg.  She has no side effects.  She is still struggling with headaches and taking Topamax.  We discussed medication side effects.  She is taking Concerta 80 mg twice a day however I recommended to try once a day so it helped insomnia.  She is taking Ambien I recommended to take 5 mg only as needed.  Discuss potential abuse and tolerance from the stimulants.  Patient does not ask for early refills.  Encouraged to see therapist for coping skills.  Recommended to call us back if she is any question, concern if he feel worsening of the symptom.  Discuss safety plan that anytime having active suicidal thoughts or homicidal thoughts and she need to call 911 or go to the local emergency room.  Follow-up in 2 months.  Sharina Petre T. 5/24/20172:24 PM

## 2015-10-21 ENCOUNTER — Ambulatory Visit (INDEPENDENT_AMBULATORY_CARE_PROVIDER_SITE_OTHER)

## 2015-10-21 DIAGNOSIS — J309 Allergic rhinitis, unspecified: Secondary | ICD-10-CM | POA: Diagnosis not present

## 2015-10-29 DIAGNOSIS — R682 Dry mouth, unspecified: Secondary | ICD-10-CM | POA: Insufficient documentation

## 2015-10-29 DIAGNOSIS — H04129 Dry eye syndrome of unspecified lacrimal gland: Secondary | ICD-10-CM | POA: Insufficient documentation

## 2015-10-29 DIAGNOSIS — M2550 Pain in unspecified joint: Secondary | ICD-10-CM | POA: Insufficient documentation

## 2015-10-31 ENCOUNTER — Other Ambulatory Visit: Payer: Self-pay | Admitting: Allergy and Immunology

## 2015-11-04 ENCOUNTER — Telehealth: Payer: Self-pay | Admitting: *Deleted

## 2015-11-04 NOTE — Telephone Encounter (Signed)
Pt records faxed to DR. Leonardo on 11/04/15 attn Maudie Mercury or Junie Panning.

## 2015-11-04 NOTE — Telephone Encounter (Signed)
This was completed on 11/04/15. Faxed to Dr. Lianne Cure office attn Maudie Mercury.

## 2015-11-25 ENCOUNTER — Ambulatory Visit (INDEPENDENT_AMBULATORY_CARE_PROVIDER_SITE_OTHER)

## 2015-11-25 DIAGNOSIS — J309 Allergic rhinitis, unspecified: Secondary | ICD-10-CM | POA: Diagnosis not present

## 2015-12-07 ENCOUNTER — Ambulatory Visit (INDEPENDENT_AMBULATORY_CARE_PROVIDER_SITE_OTHER): Admitting: *Deleted

## 2015-12-07 DIAGNOSIS — J309 Allergic rhinitis, unspecified: Secondary | ICD-10-CM

## 2015-12-20 ENCOUNTER — Ambulatory Visit (HOSPITAL_COMMUNITY): Payer: Self-pay | Admitting: Psychiatry

## 2015-12-21 DIAGNOSIS — J301 Allergic rhinitis due to pollen: Secondary | ICD-10-CM | POA: Diagnosis not present

## 2015-12-22 DIAGNOSIS — J3089 Other allergic rhinitis: Secondary | ICD-10-CM | POA: Diagnosis not present

## 2015-12-23 ENCOUNTER — Ambulatory Visit (INDEPENDENT_AMBULATORY_CARE_PROVIDER_SITE_OTHER)

## 2015-12-23 DIAGNOSIS — J309 Allergic rhinitis, unspecified: Secondary | ICD-10-CM

## 2016-01-10 ENCOUNTER — Other Ambulatory Visit (HOSPITAL_COMMUNITY): Payer: Self-pay

## 2016-01-10 ENCOUNTER — Other Ambulatory Visit: Payer: Self-pay | Admitting: Allergy and Immunology

## 2016-01-10 ENCOUNTER — Other Ambulatory Visit (HOSPITAL_COMMUNITY): Payer: Self-pay | Admitting: Psychiatry

## 2016-01-10 DIAGNOSIS — F331 Major depressive disorder, recurrent, moderate: Secondary | ICD-10-CM

## 2016-01-10 MED ORDER — ESCITALOPRAM OXALATE 20 MG PO TABS: 30.0000 mg | ORAL_TABLET | Freq: Every day | ORAL | 0 refills | Status: DC

## 2016-01-14 ENCOUNTER — Other Ambulatory Visit: Payer: Self-pay | Admitting: *Deleted

## 2016-01-14 ENCOUNTER — Telehealth: Payer: Self-pay | Admitting: Gastroenterology

## 2016-01-14 MED ORDER — RANITIDINE HCL 150 MG PO TABS: ORAL_TABLET | ORAL | 3 refills | Status: DC

## 2016-01-14 NOTE — Telephone Encounter (Signed)
Left a message for patient to call back. 

## 2016-01-14 NOTE — Progress Notes (Unsigned)
Left a message for patient with recommendations on her cell number.Rx sent to local pharmacy.

## 2016-01-14 NOTE — Telephone Encounter (Signed)
She can take ranitidine (generic zantac) 150mg  pill, once to twice daily as needed for breakthrough GERD symptoms.

## 2016-01-14 NOTE — Telephone Encounter (Signed)
Patient of Dr. Doyne Keel with a hx of severe reflux. She is taking Dexilant 60 mg daily. She reports for the last few days, she has had bad reflux causing pain when she eats. She is asking what she can take for breakthrough. Please, advise. DOD- Ardis Hughs

## 2016-01-25 ENCOUNTER — Telehealth (HOSPITAL_COMMUNITY): Payer: Self-pay

## 2016-01-26 ENCOUNTER — Ambulatory Visit (HOSPITAL_COMMUNITY): Payer: Self-pay | Admitting: Psychiatry

## 2016-01-27 ENCOUNTER — Encounter (HOSPITAL_COMMUNITY): Payer: Self-pay | Admitting: Psychiatry

## 2016-01-27 ENCOUNTER — Ambulatory Visit (INDEPENDENT_AMBULATORY_CARE_PROVIDER_SITE_OTHER): Admitting: Psychiatry

## 2016-01-27 VITALS — BP 120/68 | HR 88 | Ht 67.0 in | Wt 148.6 lb

## 2016-01-27 DIAGNOSIS — F900 Attention-deficit hyperactivity disorder, predominantly inattentive type: Secondary | ICD-10-CM

## 2016-01-27 MED ORDER — CLONIDINE HCL 0.1 MG PO TABS: 0.1000 mg | ORAL_TABLET | Freq: Every day | ORAL | 11 refills | Status: DC

## 2016-01-27 NOTE — Progress Notes (Signed)
BH MD/PA/NP OP Progress Note  01/27/2016 9:11 AM Angela [name]  MRN:  [id number]  Chief Complaint:post sleep study with a recommendation to discontinue zolpidem  Subjective:  Things are going well HPI: the sleep study resulted in a diagnosis of sleep apnea she reports.  Not currently recommended to use a CPAP device but did recommend stopping the Ambien because of its effect on sleep structure.  Ambien helps her get to sleep but she awakes in about 4 hours and has trouble getting back to sleep.  If no medication it takes about 2 hours to get to sleep.  The last dose of Concerta is 1 pm, she says.   Visit Diagnosis: ADHD inattentive  Past Psychiatric History: no changes  Past Medical History:  Past Medical History:  Diagnosis Date  . Acne   . Biliary dyskinesia 11/2013   symptomatic  . Chronic constipation   . Closed head injury 2012  . Cluster headaches   . Complication of anesthesia    wakes up combative  . Delayed gastric emptying   . GERD (gastroesophageal reflux disease)   . Heart murmur   . Muscle spasms of neck    receives PT; needs to have neck support during surgery, per mother  . Pulmonary artery stenosis    left    Past Surgical History:  Procedure Laterality Date  . [place] STUDY N/A 07/26/2015   Procedure: [place] STUDY;  Surgeon: Manus Gunning, MD;  Location: [facility];  Service: Gastroenterology;  Laterality: N/A;  . APPENDECTOMY    . CHOLECYSTECTOMY N/A 12/09/2013   Procedure: LAPAROSCOPIC CHOLECYSTECTOMY WITH INTRAOPERATIVE CHOLANGIOGRAM;  Surgeon: Gwenyth Ober, MD;  Location: [facility];  Service: General;  Laterality: N/A;  . ESOPHAGEAL MANOMETRY N/A 07/26/2015   Procedure: ESOPHAGEAL MANOMETRY (EM);  Surgeon: Manus Gunning, MD;  Location: [facility];  Service: Gastroenterology;  Laterality: N/A;  . ESOPHAGOGASTRODUODENOSCOPY (EGD) WITH PROPOFOL  07/26/2012  . HYMENECTOMY  12/21/2006  . LAPAROSCOPIC APPENDECTOMY N/A 09/28/2013   Procedure:  APPENDECTOMY LAPAROSCOPIC;  Surgeon: Gwenyth Ober, MD;  Location: [facility];  Service: General;  Laterality: N/A;  . LAPAROSCOPIC CHOLECYSTECTOMY  12/09/2013  . NASAL SEPTUM SURGERY  2009   x 2  . ROBOTIC ASSISTED LAPAROSCOPIC OVARIAN CYSTECTOMY Right 01/20/2011  . TONSILLECTOMY AND ADENOIDECTOMY  1999    Family Psychiatric History: no changes  Family History:  Family History  Problem Relation Age of Onset  . Colon cancer Maternal Grandmother   . Other Maternal Grandfather     pituitary tumor  . Diabetes Paternal Grandmother   . Heart Problems Paternal Grandfather     A fib, CHF    Social History:  Social History   Social History  . Marital status: Single    Spouse name: N/A  . Number of children: 0  . Years of education: Soph[employer]   Occupational History  . student     [employer]   Social History Main Topics  . Smoking status: Never Smoker  . Smokeless tobacco: Never Used  . Alcohol use No  . Drug use: No  . Sexual activity: No   Other Topics Concern  . None   Social History Narrative   Pt lives at home with family.   Caffeine Use: very little    Allergies:  Allergies  Allergen Reactions  . Cyanoacrylate Rash    Component of dermabond  . Clavulanic Acid Nausea And Vomiting    Pt can have penicillins  . Compazine [  Prochlorperazine Edisylate] Other (See Comments)    COMBATIVE AND BELLIGERENT  . Ketamine Rash and Other (See Comments)    HALLUCINATIONS AND COMBATIVE  . Topamax [Topiramate] Hives and Rash    COMBATIVE  . Adhesive [Tape] Rash    BLISTERS  . Flagyl [Metronidazole] Rash  . Other Rash    Monocryl    Metabolic Disorder Labs: No results found for: HGBA1C, MPG No results found for: PROLACTIN No results found for: CHOL, TRIG, HDL, CHOLHDL, VLDL, LDLCALC   Current Medications: Current Outpatient Prescriptions  Medication Sig Dispense Refill  . cetirizine (ZYRTEC) 10 MG tablet Take 10 mg by mouth at bedtime.     . cloNIDine (CATAPRES) 0.1 MG tablet  Take 1 tablet (0.1 mg total) by mouth daily. At bedtime 60 tablet 11  . dexlansoprazole (DEXILANT) 60 MG capsule Take 1 capsule (60 mg total) by mouth daily. 90 capsule 3  . EPINEPHrine (EPIPEN 2-PAK) 0.3 mg/0.3 mL IJ SOAJ injection Inject 0.3 mg into the muscle as needed.    Marland Kitchen escitalopram (LEXAPRO) 20 MG tablet Take 1.5 tablets (30 mg total) by mouth daily. 45 tablet 0  . hydrocortisone 2.5 % cream APPLY TOPICALLY TWICE A DAY 28 g 0  . hyoscyamine (LEVSIN/SL) 0.125 MG SL tablet Place 1 tablet (0.125 mg total) under the tongue every 4 (four) hours as needed. 30 tablet 4  . magnesium oxide (MAG-OX) 400 MG tablet Take 800 mg by mouth daily.    . methylphenidate (CONCERTA) 18 MG PO CR tablet Take 1 tablet (18 mg total) by mouth 2 times daily at 12 noon and 4 pm. 60 tablet 0  . ondansetron (ZOFRAN) 4 MG tablet Take 1 tablet (4 mg total) by mouth every 6 (six) hours as needed for nausea. 20 tablet 0  . ranitidine (ZANTAC) 150 MG tablet Take one or two po daily prn breakthrough GERD 60 tablet 3  . tiZANidine (ZANAFLEX) 4 MG capsule 4 mg.    Marland Kitchen tiZANidine (ZANAFLEX) 4 MG tablet TAKE 1 TABLET TWICE A DAY AS NEEDED (SCHEDULE APPOINTMENT) 60 tablet 0  . topiramate (TOPAMAX) 25 MG tablet 100 mg.  0  . TRI-LINYAH 0.18/0.215/0.25 MG-35 MCG tablet TAKE 1 TABLET BY MOUTH DAILY CONTINUOUSLY  0   No current facility-administered medications for this visit.     Neurologic: Headache: Yes Seizure: Negative Paresthesias: Negative  Musculoskeletal: Strength & Muscle Tone: within normal limits Gait & Station: normal Patient leans: N/A  Psychiatric Specialty Exam: ROS  Blood pressure 120/68, pulse 88, height 5\' 7"  (1.702 m), weight 148 lb 9.6 oz (67.4 kg).Body mass index is 23.27 kg/m.  General Appearance: Well Groomed  Eye Contact:  Good  Speech:  Clear and Coherent  Volume:  Normal  Mood:  Euthymic  Affect:  Appropriate  Thought Process:  Coherent and Goal Directed  Orientation:  Full (Time, Place,  and Person)  Thought Content: Logical   Suicidal Thoughts:  No  Homicidal Thoughts:  No  Memory:  Immediate;   Good Recent;   Good Remote;   Good  Judgement:  Good  Insight:  Good  Psychomotor Activity:  Normal  Concentration:  Concentration: Good and Attention Span: Good  Recall:  Good  Fund of Knowledge: Good  Language: Good  Akathisia:  Negative  Handed:  Right  AIMS (if indicated):  0  Assets:  Communication Skills Desire for Improvement Financial Resources/Insurance Housing Leisure Time Physical Health Resilience Social Support Talents/Skills Transportation Vocational/Educational  ADL's:  Intact  Cognition: WNL  Sleep:  Poor , diagnosed with sleep apnea     Treatment Plan Summary:ADHD continue Concerta 18 mg bid Depression:  Continue Lexapro 30 mg daily Anxiety:  Continue Lexapro daily, anxiety under control Sleep:  Discontinue zolpidem and try clonidine 0.1 mg hs prn.  Call if problems Return in 3 months or sooner if clonidine does not help or causes problems   Donnelly Angelica, MD 01/27/2016, 9:11 AM

## 2016-01-28 ENCOUNTER — Ambulatory Visit (INDEPENDENT_AMBULATORY_CARE_PROVIDER_SITE_OTHER)

## 2016-01-28 DIAGNOSIS — J309 Allergic rhinitis, unspecified: Secondary | ICD-10-CM | POA: Diagnosis not present

## 2016-02-15 ENCOUNTER — Other Ambulatory Visit (HOSPITAL_COMMUNITY): Payer: Self-pay

## 2016-02-15 MED ORDER — ZOLPIDEM TARTRATE 5 MG PO TABS: 5.0000 mg | ORAL_TABLET | Freq: Every evening | ORAL | 0 refills | Status: DC | PRN

## 2016-02-15 NOTE — Progress Notes (Signed)
  Patient calling because the Clonodine given to her at her last appointment is making her sleepy all day. Patient is requesting that she be put back on Ambien 5 mg. Dr. Lovena Le was agreeable to this and it was called into her pharmacy. Patient was called and notified.

## 2016-02-16 ENCOUNTER — Ambulatory Visit (INDEPENDENT_AMBULATORY_CARE_PROVIDER_SITE_OTHER)

## 2016-02-16 DIAGNOSIS — J309 Allergic rhinitis, unspecified: Secondary | ICD-10-CM | POA: Diagnosis not present

## 2016-02-28 ENCOUNTER — Other Ambulatory Visit (HOSPITAL_COMMUNITY): Payer: Self-pay

## 2016-02-28 ENCOUNTER — Other Ambulatory Visit (HOSPITAL_COMMUNITY): Payer: Self-pay | Admitting: Psychiatry

## 2016-02-28 ENCOUNTER — Telehealth (HOSPITAL_COMMUNITY): Payer: Self-pay

## 2016-02-28 DIAGNOSIS — F331 Major depressive disorder, recurrent, moderate: Secondary | ICD-10-CM

## 2016-02-28 MED ORDER — ESCITALOPRAM OXALATE 20 MG PO TABS: 30.0000 mg | ORAL_TABLET | Freq: Every day | ORAL | 0 refills | Status: DC

## 2016-02-28 NOTE — Telephone Encounter (Signed)
Patient is calling for a refill on her Lexapro and her Concerta, Lexapro refill was appropriate and patient has a follow up in November and was last seen in August. Per [facility] this was sent in as a 90 day order. Okay to print the Concerta? Please review and advise, thank you

## 2016-02-29 ENCOUNTER — Ambulatory Visit (INDEPENDENT_AMBULATORY_CARE_PROVIDER_SITE_OTHER)

## 2016-02-29 ENCOUNTER — Other Ambulatory Visit (HOSPITAL_COMMUNITY): Payer: Self-pay | Admitting: Psychiatry

## 2016-02-29 DIAGNOSIS — J309 Allergic rhinitis, unspecified: Secondary | ICD-10-CM

## 2016-02-29 DIAGNOSIS — F900 Attention-deficit hyperactivity disorder, predominantly inattentive type: Secondary | ICD-10-CM

## 2016-02-29 MED ORDER — METHYLPHENIDATE HCL ER (OSM) 18 MG PO TBCR: 18.0000 mg | EXTENDED_RELEASE_TABLET | Freq: Two times a day (BID) | ORAL | 0 refills | Status: DC

## 2016-03-01 ENCOUNTER — Telehealth: Payer: Self-pay

## 2016-03-01 NOTE — Telephone Encounter (Signed)
Pt questions yesterday if we had tested her for rabbit when she was orginally tested. After locating her chart I seen we did not test her for rabbit. I lm for pt to call us back to inform her no we did not test her. She had a reaction recently to rabbit.

## 2016-03-03 ENCOUNTER — Ambulatory Visit (INDEPENDENT_AMBULATORY_CARE_PROVIDER_SITE_OTHER): Admitting: *Deleted

## 2016-03-03 DIAGNOSIS — J309 Allergic rhinitis, unspecified: Secondary | ICD-10-CM

## 2016-03-08 ENCOUNTER — Ambulatory Visit
Admission: RE | Admit: 2016-03-08 | Discharge: 2016-03-08 | Disposition: A | Discharge status: Home / Self Care | Source: Ambulatory Visit | Attending: Pediatrics | Admitting: Pediatrics

## 2016-03-08 ENCOUNTER — Other Ambulatory Visit: Payer: Self-pay | Admitting: Pediatrics

## 2016-03-08 DIAGNOSIS — R52 Pain, unspecified: Secondary | ICD-10-CM

## 2016-03-14 ENCOUNTER — Telehealth: Payer: Self-pay | Admitting: Gastroenterology

## 2016-03-14 NOTE — Telephone Encounter (Signed)
Spoke to patient, she states that for the last week she has had "sharp, pain under her left rib, especially when she bends over". She saw her PCP, who did xray, did not really see anything and suggested she call here. Patient states she is able to eat, has regular bm, denies fever. Patient offered earlier appointment but conflicted with her school schedule. Scheduled to see PA on Friday, 10/20.

## 2016-03-14 NOTE — Telephone Encounter (Signed)
Called patient lvm that if pain worsens to call our office and needs to avoid heavy lifting or exertion. Keep appointment for Friday.

## 2016-03-14 NOTE — Telephone Encounter (Signed)
That sounds reasonable. Symptoms could be related to abdominal wall pain as she describes it. If she worsens in the interim while waiting for appointment she can call back. Tell her to avoid any heavy lifting , exertion. Thanks

## 2016-03-17 ENCOUNTER — Encounter: Payer: Self-pay | Admitting: Physician Assistant

## 2016-03-17 ENCOUNTER — Ambulatory Visit (INDEPENDENT_AMBULATORY_CARE_PROVIDER_SITE_OTHER)
Admission: RE | Admit: 2016-03-17 | Discharge: 2016-03-17 | Disposition: A | Discharge status: Home / Self Care | Source: Ambulatory Visit | Attending: Physician Assistant | Admitting: Physician Assistant

## 2016-03-17 ENCOUNTER — Ambulatory Visit (INDEPENDENT_AMBULATORY_CARE_PROVIDER_SITE_OTHER): Admitting: Physician Assistant

## 2016-03-17 VITALS — BP 100/60 | HR 76 | Ht 67.0 in | Wt 145.8 lb

## 2016-03-17 DIAGNOSIS — R1012 Left upper quadrant pain: Secondary | ICD-10-CM | POA: Diagnosis not present

## 2016-03-17 DIAGNOSIS — R0781 Pleurodynia: Secondary | ICD-10-CM

## 2016-03-17 NOTE — Progress Notes (Signed)
Agree with assessment and plan, thanks for seeing her Amy.

## 2016-03-17 NOTE — Patient Instructions (Signed)
Your provider has requested that you have rib x rays before leaving today. Please go to the basement floor to our Radiology department for the test.  If you are age 22 or older, your body mass index should be between 23-30. Your Body mass index is 22.84 kg/m. If this is out of the aforementioned range listed, please consider follow up with your Primary Care Provider.  If you are age 48 or younger, your body mass index should be between 19-25. Your Body mass index is 22.84 kg/m. If this is out of the aformentioned range listed, please consider follow up with your Primary Care Provider.

## 2016-03-17 NOTE — Progress Notes (Signed)
Subjective:    Patient ID: Angela [name], female    DOB: 09/10/1995, 20 y.o.   MRN: [id number]  HPI Angela [name] is a pleasant 20 year old white female known to Dr. Havery [name]. She has history of previous couple cholecystectomy, lap scopic appendectomy, history of depression and ADHD. She has also suffered from reflux and gastroparesis. Gastroparesis idiopathic and documented on gastric emptying scan July 2015. Last office visit January 2017. At that time she had requested manometry and pH testing. She has been maintained on Dexilant .Marland Kitchen PH test was normal on Dexilant. Manometry showed absent peristalsis, no evidence of achalasia. She was then referred to rheumatology to rule out underlying collagen vascular disease/scleroderma. She says rheumatologic evaluation was negative with no new diagnosis made. She comes in today with complaints of left upper quadrant pain which is been present over the past 3 weeks. He says initially when this started she noticed it with bending over or with sitting. She is now having fairly constant symptoms with a's comfort tucked up under her left rib cage. She says sometimes when she is walking or bending she will hear a popping noise and then gets a sharp pain. Has not had any cough or shortness of breath. No known injury fall etc. She has been doing kickboxing but does not feel that she's had any injury muscle strain etc. She's not noticed any change in her discomfort with by mouth intake, no nausea vomiting change in bowel habits etc. She was seen by her PCP had a chest x-ray done which was negative and one view KUB which did show a lot of retained stool. She said she did take a laxative purge her bowel with no change in the left-sided discomfort.  Review of Systems Pertinent positive and negative review of systems were noted in the above HPI section.  All other review of systems was otherwise negative.  Outpatient Encounter Prescriptions as of 03/17/2016  Medication Sig  .  cetirizine (ZYRTEC) 10 MG tablet Take 10 mg by mouth at bedtime.   Marland Kitchen dexlansoprazole (DEXILANT) 60 MG capsule Take 1 capsule (60 mg total) by mouth daily.  Marland Kitchen EPINEPHrine (EPIPEN 2-PAK) 0.3 mg/0.3 mL IJ SOAJ injection Inject 0.3 mg into the muscle as needed.  Marland Kitchen escitalopram (LEXAPRO) 20 MG tablet Take 1.5 tablets (30 mg total) by mouth daily.  . hydrocortisone 2.5 % cream APPLY TOPICALLY TWICE A DAY  . hyoscyamine (LEVSIN/SL) 0.125 MG SL tablet Place 1 tablet (0.125 mg total) under the tongue every 4 (four) hours as needed.  . magnesium oxide (MAG-OX) 400 MG tablet Take 800 mg by mouth daily.  . methylphenidate (CONCERTA) 18 MG PO CR tablet Take 1 tablet (18 mg total) by mouth 2 times daily at 12 noon and 4 pm.  . ondansetron (ZOFRAN) 4 MG tablet Take 1 tablet (4 mg total) by mouth every 6 (six) hours as needed for nausea.  . ranitidine (ZANTAC) 150 MG tablet Take one or two po daily prn breakthrough GERD  . tiZANidine (ZANAFLEX) 4 MG capsule 4 mg.  . topiramate (TOPAMAX) 25 MG tablet 100 mg.  . zolpidem (AMBIEN) 5 MG tablet Take 1 tablet (5 mg total) by mouth at bedtime as needed for sleep.  . [DISCONTINUED] cloNIDine (CATAPRES) 0.1 MG tablet Take 1 tablet (0.1 mg total) by mouth daily. At bedtime (Patient not taking: Reported on 03/17/2016)  . [DISCONTINUED] tiZANidine (ZANAFLEX) 4 MG tablet TAKE 1 TABLET TWICE A DAY AS NEEDED (SCHEDULE APPOINTMENT) (Patient not taking: Reported on 03/17/2016)  . [  DISCONTINUED] TRI-LINYAH 0.18/0.215/0.25 MG-35 MCG tablet TAKE 1 TABLET BY MOUTH DAILY CONTINUOUSLY   No facility-administered encounter medications on file as of 03/17/2016.    Allergies  Allergen Reactions  . Cyanoacrylate Rash    Component of dermabond  . Clavulanic Acid Nausea And Vomiting    Pt can have penicillins  . Compazine [Prochlorperazine Edisylate] Other (See Comments)    COMBATIVE AND BELLIGERENT  . Ketamine Rash and Other (See Comments)    HALLUCINATIONS AND COMBATIVE  . Topamax  [Topiramate] Hives and Rash    COMBATIVE  . Adhesive [Tape] Rash    BLISTERS  . Flagyl [Metronidazole] Rash  . Other Rash    Monocryl   Patient Active Problem List   Diagnosis Date Noted  . Dysphagia   . Esophageal reflux   . Gastroesophageal reflux disease with esophagitis 03/10/2015  . Major depression, recurrent, chronic ([place]) 02/09/2015  . GAD (generalized anxiety disorder) 02/09/2015  . ADHD (attention deficit hyperactivity disorder) 02/09/2015  . Rhinitis, allergic 02/06/2015  . Gastroparesis 12/10/2013  . Cholecystitis, chronic 12/09/2013  . Symptomatic biliary dyskinesia 12/02/2013  . Abdominal pain, unspecified site 10/30/2013  . Nausea alone 10/30/2013  . Constipation 10/30/2013  . Postop check 10/14/2013  . Postoperative wound infection 10/06/2013  . Acute appendicitis 09/28/2013  . Migraine variant 10/30/2012  . Tension headache 10/30/2012  . Bilateral occipital neuralgia 10/30/2012  . GERD (gastroesophageal reflux disease) 07/15/2012   Social History   Social History  . Marital status: Single    Spouse name: N/A  . Number of children: 0  . Years of education: [employer]   Occupational History  . student     [employer]   Social History Main Topics  . Smoking status: Never Smoker  . Smokeless tobacco: Never Used  . Alcohol use No  . Drug use: No  . Sexual activity: No   Other Topics Concern  . Not on file   Social History Narrative   Pt lives at home with family.   Caffeine Use: very little    [name]'s family history includes Colon cancer in her maternal grandmother; Diabetes in her paternal grandmother; Heart Problems in her paternal grandfather; Other in her maternal grandfather.      Objective:    Vitals:   03/17/16 1406  BP: 100/60  Pulse: 76    Physical Exam  well-developed young white female in no acute distress, pleasant blood pressure 100/60 pulse 76, BMI 22.8. HEENT; nontraumatic normocephalic EOMI PERRLA sclera anicteric,  Cardiovascular r;egular rate and rhythm with S1-S2 no murmur or gallop, Pulmonary ;clear bilaterally, Abdomen ;soft,ome tenderness to palpation in the left upper quadrant along the costal margin and is also tender to palpation along the left posterior lower ribs laterally, no significant abdominal tenderness no guarding or rebound no palpable mass or hepatosplenomegaly bowel sounds are present, Rectal; exam not done, Ext; no clubbing cyanosis or edema skin warm and dry, Neuropsych; mood and affect appropriate       Assessment & Plan:   #41  20 year old female with 3 week history of left costal margin and left posterior and lateral rib pain and intermittent popping. I do not think her pain is of GI etiology-rule out rib injury versus muscle strain. #2 idiopathic gastroparesis #3 esophageal aperistalsis recent rheumatologic evaluation negative for underlying collagen vascular disease #4  chronic GERD #5 depression/anxiety #6 AD HD  Plan; advised patient to stop kickboxing over the next couple of weeks to allow healing Moist heat 2-3 times daily May  try a an OTC lidocaine patch hours per day We will obtain left rib detail films No changes to current GI regimen  Angela [name] Genia Harold PA-C 03/17/2016   Cc: Lennie Hummer, MD

## 2016-03-23 ENCOUNTER — Ambulatory Visit (INDEPENDENT_AMBULATORY_CARE_PROVIDER_SITE_OTHER): Admitting: *Deleted

## 2016-03-23 DIAGNOSIS — J309 Allergic rhinitis, unspecified: Secondary | ICD-10-CM

## 2016-03-31 ENCOUNTER — Telehealth: Payer: Self-pay | Admitting: Physician Assistant

## 2016-03-31 ENCOUNTER — Ambulatory Visit (INDEPENDENT_AMBULATORY_CARE_PROVIDER_SITE_OTHER)

## 2016-03-31 DIAGNOSIS — J309 Allergic rhinitis, unspecified: Secondary | ICD-10-CM

## 2016-03-31 NOTE — Telephone Encounter (Signed)
Yes ..she may have a note - start from the date I saw her and cover for a month -

## 2016-03-31 NOTE — Telephone Encounter (Signed)
Patient is aware. She also asks about her referral to ortho.

## 2016-04-03 ENCOUNTER — Other Ambulatory Visit: Payer: Self-pay

## 2016-04-03 DIAGNOSIS — M940 Chondrocostal junction syndrome [Tietze]: Secondary | ICD-10-CM

## 2016-04-03 NOTE — Progress Notes (Signed)
Referral through [facility] placed to [facility]

## 2016-04-03 NOTE — Telephone Encounter (Signed)
Letter at the front desk as planned for the patient to pick up. Referral sent through [facility].

## 2016-04-26 ENCOUNTER — Ambulatory Visit (INDEPENDENT_AMBULATORY_CARE_PROVIDER_SITE_OTHER): Admitting: Orthopedic Surgery

## 2016-04-27 ENCOUNTER — Ambulatory Visit (HOSPITAL_COMMUNITY): Payer: Self-pay | Admitting: Psychiatry

## 2016-04-27 ENCOUNTER — Other Ambulatory Visit (HOSPITAL_COMMUNITY): Payer: Self-pay

## 2016-04-27 DIAGNOSIS — F331 Major depressive disorder, recurrent, moderate: Secondary | ICD-10-CM

## 2016-04-27 NOTE — Telephone Encounter (Signed)
Medication management - Patient left a message stating she had to cancel appointment 04/27/16 due to a migraine and requests refills until she can come back in, now scheduled for 06/21/15.  Requests Concerta and Lexapro new orders.

## 2016-04-28 ENCOUNTER — Other Ambulatory Visit (HOSPITAL_COMMUNITY): Payer: Self-pay | Admitting: Psychiatry

## 2016-04-28 ENCOUNTER — Encounter (INDEPENDENT_AMBULATORY_CARE_PROVIDER_SITE_OTHER): Payer: Self-pay | Admitting: Orthopaedic Surgery

## 2016-04-28 ENCOUNTER — Ambulatory Visit (INDEPENDENT_AMBULATORY_CARE_PROVIDER_SITE_OTHER): Admitting: Orthopaedic Surgery

## 2016-04-28 VITALS — BP 95/71 | HR 76 | Ht 67.0 in | Wt 140.0 lb

## 2016-04-28 DIAGNOSIS — R109 Unspecified abdominal pain: Secondary | ICD-10-CM

## 2016-04-28 DIAGNOSIS — R0781 Pleurodynia: Secondary | ICD-10-CM

## 2016-04-28 DIAGNOSIS — F900 Attention-deficit hyperactivity disorder, predominantly inattentive type: Secondary | ICD-10-CM

## 2016-04-28 MED ORDER — METHYLPHENIDATE HCL ER (OSM) 18 MG PO TBCR: 18.0000 mg | EXTENDED_RELEASE_TABLET | Freq: Two times a day (BID) | ORAL | 0 refills | Status: DC

## 2016-04-28 MED ORDER — ESCITALOPRAM OXALATE 20 MG PO TABS: 30.0000 mg | ORAL_TABLET | Freq: Every day | ORAL | 0 refills | Status: DC

## 2016-04-28 NOTE — Telephone Encounter (Signed)
Okay to refill but should see physician for the future refills.

## 2016-04-28 NOTE — Addendum Note (Signed)
Addended by: Mervyn Skeeters on: 04/28/2016 01:11 PM   Modules accepted: Orders

## 2016-04-28 NOTE — Telephone Encounter (Signed)
Medication management - New Lexapro order e-scribed to patient's [facility] on [address] as approved by Dr. Adele Schilder.  Telephone call with patient to inform her Concerta order was prepared for pick up and her Lexapro order was sent to her pharmacy.Agreed to send another request to Dr. Adele Schilder to see if he would be willing to fill her Ambien as well but informed this would not get to him until Monday due to office closed at this time.  Patient agreed to come pick up Concerta order in the next 10 minutes so agreed to wait to provide.

## 2016-04-28 NOTE — Progress Notes (Deleted)
Office Visit Note   Patient: Angela [name]           Date of Birth: [birth year]           MRN: [id number] Visit Date: 04/28/2016              Requested by: Alfredia Ferguson, PA-C [place], [place] [address] PCP: Treasa School, MD   Assessment & Plan: Visit Diagnoses: No diagnosis found.  Plan: ***  Follow-Up Instructions: No Follow-up on file.   Orders:  No orders of the defined types were placed in this encounter.  No orders of the defined types were placed in this encounter.     Procedures: No procedures performed   Clinical Data: No additional findings.   Subjective: No chief complaint on file.   Pt complaining of rib pain, it "pops" out sometimes. Denies injury, no bouts of coughing Pt has [facility] a PA at [facility] PCP Raliegh Ip Dr. Frann Rider one can find out what's wrong    Review of Systems   Objective: Vital Signs: There were no vitals taken for this visit.  Physical Exam  Ortho Exam  Specialty Comments:  No specialty comments available.  Imaging: No results found.   PMFS History: Patient Active Problem List   Diagnosis Date Noted  . Dysphagia   . Esophageal reflux   . Gastroesophageal reflux disease with esophagitis 03/10/2015  . Major depression, recurrent, chronic ([place]) 02/09/2015  . GAD (generalized anxiety disorder) 02/09/2015  . ADHD (attention deficit hyperactivity disorder) 02/09/2015  . Rhinitis, allergic 02/06/2015  . Gastroparesis 12/10/2013  . Cholecystitis, chronic 12/09/2013  . Symptomatic biliary dyskinesia 12/02/2013  . Abdominal pain, unspecified site 10/30/2013  . Nausea alone 10/30/2013  . Constipation 10/30/2013  . Postop check 10/14/2013  . Postoperative wound infection 10/06/2013  . Acute appendicitis 09/28/2013  . Migraine variant 10/30/2012  . Tension headache 10/30/2012  . Bilateral occipital neuralgia 10/30/2012  . GERD (gastroesophageal reflux disease) 07/15/2012   Past Medical  History:  Diagnosis Date  . Acne   . Biliary dyskinesia 11/2013   symptomatic  . Chronic constipation   . Closed head injury 2012  . Cluster headaches   . Complication of anesthesia    wakes up combative  . Delayed gastric emptying   . GERD (gastroesophageal reflux disease)   . Heart murmur   . Muscle spasms of neck    receives PT; needs to have neck support during surgery, per mother  . Pulmonary artery stenosis    left    Family History  Problem Relation Age of Onset  . Colon cancer Maternal Grandmother   . Other Maternal Grandfather     pituitary tumor  . Diabetes Paternal Grandmother   . Heart Problems Paternal Grandfather     A fib, CHF    Past Surgical History:  Procedure Laterality Date  . [place] STUDY N/A 07/26/2015   Procedure: [place] STUDY;  Surgeon: Manus Gunning, MD;  Location: [facility];  Service: Gastroenterology;  Laterality: N/A;  . APPENDECTOMY    . CHOLECYSTECTOMY N/A 12/09/2013   Procedure: LAPAROSCOPIC CHOLECYSTECTOMY WITH INTRAOPERATIVE CHOLANGIOGRAM;  Surgeon: Gwenyth Ober, MD;  Location: [place];  Service: General;  Laterality: N/A;  . ESOPHAGEAL MANOMETRY N/A 07/26/2015   Procedure: ESOPHAGEAL MANOMETRY (EM);  Surgeon: Manus Gunning, MD;  Location: [facility];  Service: Gastroenterology;  Laterality: N/A;  . ESOPHAGOGASTRODUODENOSCOPY (EGD) WITH PROPOFOL  07/26/2012  . HYMENECTOMY  12/21/2006  . LAPAROSCOPIC APPENDECTOMY N/A 09/28/2013   Procedure: APPENDECTOMY LAPAROSCOPIC;  Surgeon: Gwenyth Ober, MD;  Location: [place];  Service: General;  Laterality: N/A;  . LAPAROSCOPIC CHOLECYSTECTOMY  12/09/2013  . NASAL SEPTUM SURGERY  2009   x 2  . ROBOTIC ASSISTED LAPAROSCOPIC OVARIAN CYSTECTOMY Right 01/20/2011  . TONSILLECTOMY AND ADENOIDECTOMY  1999   Social History   Occupational History  . student     [employer]   Social History Main Topics  . Smoking status: Never Smoker  . Smokeless tobacco: Never Used  . Alcohol use No  .  Drug use: No  . Sexual activity: No

## 2016-04-28 NOTE — Progress Notes (Signed)
Office Visit Note   Patient: Angela [name]           Date of Birth: 08-30-1995           MRN: [id number] Visit Date: 04/28/2016              Requested by: Alfredia Ferguson, PA-C [place], [place] [address] PCP: Treasa School, MD   Assessment & Plan: Visit Diagnoses: Patient has been experiencing popping clicking and some extent pain in the  lower left rib cage for several months. There is no history of injury or trauma. It appears that the problem is a costochondritis. I think its worth obtaining an MRI scan for further review.  1. Rib pain on left side     Plan: f/u after MRI of left lower ribs  Follow-Up Instructions: Return for after MRI.   Orders:  No orders of the defined types were placed in this encounter.  No orders of the defined types were placed in this encounter.     Procedures: No procedures performed   Clinical Data:Angela [name] has been experiencing left lower rib cage pain associated with popping for several months. There is no history of injury or trauma. She's been to [facility] with a diagnosis of what she believes was costochondritis. X-rays were negative. She also has been dural gastrointestinal physician who did not proffer a specific diagnosis. She is just frustrated with the fact that it can use to be symptomatic. She denies any nausea or vomiting. There is no good difficulty with urination. She denies any shortness of breath or chest pain.  No additional findings.   Subjective: No chief complaint on file.   HPI  Review of Systems   Objective: Vital Signs: BP 95/71   Pulse 76   Ht 5\' 7"  (1.702 m)   Wt 140 lb (63.5 kg)   BMI 21.93 kg/m   Physical Exam  Ortho ExamExam demonstrates some tenderness at the tip of what appears to be the seventh rib in the midaxillary line left side bruising there is no ecchymosis there is no crepitation. There was no deeper pain in the left upper quadrant of her abdomen. Straight leg raise is  negative bilaterally pain is range of motion of both hips.  Specialty Comments:  No specialty comments available.  Imaging: No results found.   PMFS History: Patient Active Problem List   Diagnosis Date Noted  . Dysphagia   . Esophageal reflux   . Gastroesophageal reflux disease with esophagitis 03/10/2015  . Major depression, recurrent, chronic ([place]) 02/09/2015  . GAD (generalized anxiety disorder) 02/09/2015  . ADHD (attention deficit hyperactivity disorder) 02/09/2015  . Rhinitis, allergic 02/06/2015  . Gastroparesis 12/10/2013  . Cholecystitis, chronic 12/09/2013  . Symptomatic biliary dyskinesia 12/02/2013  . Abdominal pain, unspecified site 10/30/2013  . Nausea alone 10/30/2013  . Constipation 10/30/2013  . Postop check 10/14/2013  . Postoperative wound infection 10/06/2013  . Acute appendicitis 09/28/2013  . Migraine variant 10/30/2012  . Tension headache 10/30/2012  . Bilateral occipital neuralgia 10/30/2012  . GERD (gastroesophageal reflux disease) 07/15/2012   Past Medical History:  Diagnosis Date  . Acne   . Biliary dyskinesia 11/2013   symptomatic  . Chronic constipation   . Closed head injury 2012  . Cluster headaches   . Complication of anesthesia    wakes up combative  . Delayed gastric emptying   . GERD (gastroesophageal reflux disease)   . Heart murmur   . Muscle  spasms of neck    receives PT; needs to have neck support during surgery, per mother  . Pulmonary artery stenosis    left    Family History  Problem Relation Age of Onset  . Colon cancer Maternal Grandmother   . Other Maternal Grandfather     pituitary tumor  . Diabetes Paternal Grandmother   . Heart Problems Paternal Grandfather     A fib, CHF    Past Surgical History:  Procedure Laterality Date  . [place] STUDY N/A 07/26/2015   Procedure: [place] STUDY;  Surgeon: Manus Gunning, MD;  Location: [facility];  Service: Gastroenterology;  Laterality: N/A;  .  APPENDECTOMY    . CHOLECYSTECTOMY N/A 12/09/2013   Procedure: LAPAROSCOPIC CHOLECYSTECTOMY WITH INTRAOPERATIVE CHOLANGIOGRAM;  Surgeon: Gwenyth Ober, MD;  Location: [place];  Service: General;  Laterality: N/A;  . ESOPHAGEAL MANOMETRY N/A 07/26/2015   Procedure: ESOPHAGEAL MANOMETRY (EM);  Surgeon: Manus Gunning, MD;  [facility];  Service: Gastroenterology;  Laterality: N/A;  . ESOPHAGOGASTRODUODENOSCOPY (EGD) WITH PROPOFOL  07/26/2012  . HYMENECTOMY  12/21/2006  . LAPAROSCOPIC APPENDECTOMY N/A 09/28/2013   Procedure: APPENDECTOMY LAPAROSCOPIC;  Surgeon: Gwenyth Ober, MD;  Location: [place];  Service: General;  Laterality: N/A;  . LAPAROSCOPIC CHOLECYSTECTOMY  12/09/2013  . NASAL SEPTUM SURGERY  2009   x 2  . ROBOTIC ASSISTED LAPAROSCOPIC OVARIAN CYSTECTOMY Right 01/20/2011  . TONSILLECTOMY AND ADENOIDECTOMY  1999   Social History   Occupational History  . student     [employer]   Social History Main Topics  . Smoking status: Never Smoker  . Smokeless tobacco: Never Used  . Alcohol use No  . Drug use: No  . Sexual activity: No

## 2016-04-28 NOTE — Telephone Encounter (Signed)
Medication management - Patient came in to pick up Concerta prescription and reported she now gets her medicaton through [facility] and requested her Lexapro be changed to there.  [place] to cancel out prescription e-scribed earlier this date and reordered with [facility] per Dr. Adele Schilder approval.

## 2016-05-01 ENCOUNTER — Other Ambulatory Visit (HOSPITAL_COMMUNITY): Payer: Self-pay | Admitting: Psychiatry

## 2016-05-04 ENCOUNTER — Telehealth (INDEPENDENT_AMBULATORY_CARE_PROVIDER_SITE_OTHER): Payer: Self-pay | Admitting: Orthopaedic Surgery

## 2016-05-04 NOTE — Telephone Encounter (Signed)
Patient called and says she just got surgical ear piercings done that she cannot take out for 6 months so [facility] told her she cannot have the MRI so she is calling to see what else can be done. Please call patient.

## 2016-05-07 ENCOUNTER — Ambulatory Visit (HOSPITAL_COMMUNITY): Admission: RE | Admit: 2016-05-07 | Source: Ambulatory Visit

## 2016-06-05 ENCOUNTER — Telehealth: Payer: Self-pay | Admitting: Gastroenterology

## 2016-06-05 NOTE — Telephone Encounter (Signed)
Patient states that she has had abdominal pain and bloating for approximately 1 week, denies any changes in bowel habits. She states she has nausea used her zofran, with some relief. Takes Zantac for refulx. Patient is scheduled to see APP on 1/11.

## 2016-06-08 ENCOUNTER — Ambulatory Visit (INDEPENDENT_AMBULATORY_CARE_PROVIDER_SITE_OTHER): Admitting: Nurse Practitioner

## 2016-06-08 ENCOUNTER — Encounter: Payer: Self-pay | Admitting: Nurse Practitioner

## 2016-06-08 ENCOUNTER — Encounter (INDEPENDENT_AMBULATORY_CARE_PROVIDER_SITE_OTHER): Payer: Self-pay

## 2016-06-08 VITALS — BP 90/60 | HR 84 | Ht 67.0 in | Wt 148.4 lb

## 2016-06-08 DIAGNOSIS — R1084 Generalized abdominal pain: Secondary | ICD-10-CM

## 2016-06-08 DIAGNOSIS — K219 Gastro-esophageal reflux disease without esophagitis: Secondary | ICD-10-CM

## 2016-06-08 DIAGNOSIS — R140 Abdominal distension (gaseous): Secondary | ICD-10-CM | POA: Diagnosis not present

## 2016-06-08 MED ORDER — HYOSCYAMINE SULFATE ER 0.375 MG PO TB12: ORAL_TABLET | ORAL | 1 refills | Status: DC

## 2016-06-08 NOTE — Patient Instructions (Addendum)
Discontinue Levsin Sublingual.  We sent a prescription for Hyoscyamine 0.375 mg, Take 1 tablet as needed.  We sent the prescription to [facility]   If recurrent bloating, call our office and ask for Paula's nurse. We may order an x-ray.

## 2016-06-08 NOTE — Progress Notes (Signed)
     HPI: Patient is a 21 year old female known to Dr. Havery Moros. She has a history of reflux and idiopathic gastroparesis documented by gastric emptying scan. She is status post cholecystectomy, appendectomy. She had an EGD February 2014 for evaluation of heartburn. Findings included nonerosive gastritis. She had a normal pH study on PPI. Patient was last seen in late October 2017 and that was for evaluation of left upper quadrant pain which was not felt to be GI in nature rather musculoskeletal. Patient had been kickboxing, she was advised to discontinue that activity for a couple of weeks. She was prescribed lidocaine patch, moist heat. Left rib x-ray unremarkable.  Angela [name] is here today with complaints of bloating over the last week. She described chronic, intermittent abdominal pain which occurs randomly and is so intense it has required emergency room visit on a couple of occasions. 2 weeks ago she had one of these episodes. Pain lasted a couple hours, she had one episode of vomiting.  She did try one of her mother's hyoscyamine tablets with partial relief of pain No associated bowel changes. She takes magnesium for headaches and this tends to keep her bowels regular . Last week patient became bloated, looked several months pregnant. Feels bloated today. Had a normal bowel movement this morning. Appetite is okay, weight is stable   Past Medical History:  Diagnosis Date  . Acne   . Biliary dyskinesia 11/2013   symptomatic  . Chronic constipation   . Closed head injury 2012  . Cluster headaches   . Complication of anesthesia    wakes up combative  . Delayed gastric emptying   . GERD (gastroesophageal reflux disease)   . Heart murmur   . Muscle spasms of neck    receives PT; needs to have neck support during surgery, per mother  . Pulmonary artery stenosis    left    Patient's surgical history, family medical history, social history, medications and allergies were all reviewed in Epic     Physical Exam: BP 90/60   Pulse 84   Ht 5\' 7"  (1.702 m)   Wt 148 lb 6.4 oz (67.3 kg)   BMI 23.24 kg/m   GENERAL: Thin white female in NAD PSYCH: :Pleasant, cooperative, normal affect HEENT: Normocephalic, conjunctiva pink, mucous membranes moist, neck supple without masses CARDIAC:  RRR,  nomurmur heard, no peripheral edema PULM: Normal respiratory effort, lungs CTA bilaterally, no wheezing ABDOMEN:  soft, nontender, nondistended, no obvious masses, no hepatomegaly,  normal bowel sounds SKIN:  turgor, no lesions seen Musculoskeletal:  Normal muscle tone, normal strength NEURO: Alert and oriented x 3, no focal neurologic deficits  ASSESSMENT and PLAN:  1. 21 yo female with recent episode of generalized abdominal pain associated with one episode of vomiting. Now with bloating. Feels bloated today but her abdominal exam is unremarkable. She has known idiopathic gastroparesis but doubt etiology of aforementioned symptoms. She describes similar episodes of pain through the years.  Patient has had abdominal surgeries so of course intermittent partial SBO possible but seems unlikely. Suspect pain is functional. Levsin 0.125mg  SL "helps some" .  -Increase to 0.375mg  BID prn. -If recurrent abdominal distention advised to call and will obtain KUB.   2. Idiopathic gastroparesis. Intolerant of reglan  3. GERD. Asymptomatic on PPI. Eats small frequent meals.    Tye Savoy , NP 06/08/2016, 3:35 PM

## 2016-06-09 NOTE — Progress Notes (Signed)
Agree with assessment and plan as outlined. Hopefully she will respond to trial of antispasmotics. If not and symptoms persist could be due to gastroparesis. Intolerant of reglan but has not tried domperidone, may consider this pending her course. Thanks for seeing her.

## 2016-06-20 ENCOUNTER — Ambulatory Visit (INDEPENDENT_AMBULATORY_CARE_PROVIDER_SITE_OTHER): Admitting: Psychiatry

## 2016-06-20 ENCOUNTER — Encounter (HOSPITAL_COMMUNITY): Payer: Self-pay | Admitting: Psychiatry

## 2016-06-20 VITALS — BP 98/64 | HR 91 | Ht 67.0 in | Wt 147.2 lb

## 2016-06-20 DIAGNOSIS — F900 Attention-deficit hyperactivity disorder, predominantly inattentive type: Secondary | ICD-10-CM

## 2016-06-20 DIAGNOSIS — Z9049 Acquired absence of other specified parts of digestive tract: Secondary | ICD-10-CM | POA: Diagnosis not present

## 2016-06-20 DIAGNOSIS — Z833 Family history of diabetes mellitus: Secondary | ICD-10-CM

## 2016-06-20 DIAGNOSIS — Z800 Family history of malignant neoplasm of digestive organs: Secondary | ICD-10-CM | POA: Diagnosis not present

## 2016-06-20 DIAGNOSIS — Z79899 Other long term (current) drug therapy: Secondary | ICD-10-CM

## 2016-06-20 DIAGNOSIS — F331 Major depressive disorder, recurrent, moderate: Secondary | ICD-10-CM | POA: Diagnosis not present

## 2016-06-20 DIAGNOSIS — Z888 Allergy status to other drugs, medicaments and biological substances status: Secondary | ICD-10-CM

## 2016-06-20 DIAGNOSIS — Z9889 Other specified postprocedural states: Secondary | ICD-10-CM | POA: Diagnosis not present

## 2016-06-20 DIAGNOSIS — Z8249 Family history of ischemic heart disease and other diseases of the circulatory system: Secondary | ICD-10-CM

## 2016-06-20 MED ORDER — METHYLPHENIDATE HCL ER (OSM) 18 MG PO TBCR: 18.0000 mg | EXTENDED_RELEASE_TABLET | Freq: Two times a day (BID) | ORAL | 0 refills | Status: DC

## 2016-06-20 MED ORDER — ESCITALOPRAM OXALATE 20 MG PO TABS: 30.0000 mg | ORAL_TABLET | Freq: Every day | ORAL | 0 refills | Status: DC

## 2016-06-20 MED ORDER — ZOLPIDEM TARTRATE 5 MG PO TABS
5.0000 mg | ORAL_TABLET | Freq: Every evening | ORAL | 1 refills | Status: DC | PRN
Start: 2016-06-20 — End: 2016-08-08

## 2016-06-20 NOTE — Progress Notes (Signed)
[place] MD/PA/NP OP Progress Note  06/20/2016 4:30 PM Angela [name]  MRN:  [id number]  Chief Complaint:  Subjective:  I tried without Ambien but I could not sleep.  Clonidine did not help me.  HPI: Medication for her follow-up appointment.  She was last seen by Dr. Lovena Le who recommended to try clonidine for sleep.  She was told to stop the Ambien but patient did not like clonidine and not able to sleep without Ambien.  She continues to have chronic headache and she scheduled to see neurologist at [facility].  She had tried multiple medication for her chronic headache and she is frustrated because nothing seems to be working.  Patient is a Ship broker at [employer].  She has taken more courses this semester and sometimes she feels overwhelmed.  Her attention and focus is good with Concerta.  She had sleep study and she was recommended to use CPAP but she has difficulty adjusting with a mask.  She feel her psychiatric medication is working very well.  She is less depressed and less anxious.  She denies any crying spells or feeling any hopelessness.  Her energy level is good.  She is living with her parents but soon moved to live with her grandparents.  She endorsed her parents are causing more stress.  Patient denies any side effects of the medication.  She denies any tremors, shakes, palpitation.  Her vital signs are stable.  Patient denies drinking alcohol or using any illegal substances.  Visit Diagnosis:    ICD-9-CM ICD-10-CM   1. Major depressive disorder, recurrent episode, moderate (HCC) 296.32 F33.1 zolpidem (AMBIEN) 5 MG tablet     escitalopram (LEXAPRO) 20 MG tablet  2. Attention deficit hyperactivity disorder (ADHD), predominantly inattentive type 314.00 F90.0 methylphenidate (CONCERTA) 18 MG PO CR tablet     DISCONTINUED: methylphenidate (CONCERTA) 18 MG PO CR tablet    Past Psychiatric History: Reviewed.    Past Medical History:  Past Medical History:   Diagnosis Date  . Acne   . Biliary dyskinesia 11/2013   symptomatic  . Chronic constipation   . Closed head injury 2012  . Cluster headaches   . Complication of anesthesia    wakes up combative  . Delayed gastric emptying   . GERD (gastroesophageal reflux disease)   . Heart murmur   . Muscle spasms of neck    receives PT; needs to have neck support during surgery, per mother  . Pulmonary artery stenosis    left    Past Surgical History:  Procedure Laterality Date  . [place] STUDY N/A 07/26/2015   Procedure: [place] STUDY;  Surgeon: Manus Gunning, MD;  Location: [facility];  Service: Gastroenterology;  Laterality: N/A;  . CHOLECYSTECTOMY N/A 12/09/2013   Procedure: LAPAROSCOPIC CHOLECYSTECTOMY WITH INTRAOPERATIVE CHOLANGIOGRAM;  Surgeon: Gwenyth Ober, MD;  Location: [place];  Service: General;  Laterality: N/A;  . ESOPHAGEAL MANOMETRY N/A 07/26/2015   Procedure: ESOPHAGEAL MANOMETRY (EM);  Surgeon: Manus Gunning, MD;  Location: [facility];  Service: Gastroenterology;  Laterality: N/A;  . ESOPHAGOGASTRODUODENOSCOPY (EGD) WITH PROPOFOL  07/26/2012  . HYMENECTOMY  12/21/2006  . LAPAROSCOPIC APPENDECTOMY N/A 09/28/2013   Procedure: APPENDECTOMY LAPAROSCOPIC;  Surgeon: Gwenyth Ober, MD;  Location: [place];  Service: General;  Laterality: N/A;  . LAPAROSCOPIC CHOLECYSTECTOMY  12/09/2013  . NASAL SEPTUM SURGERY  2009   x 2  . ROBOTIC ASSISTED LAPAROSCOPIC OVARIAN CYSTECTOMY Right 01/20/2011  . TONSILLECTOMY AND ADENOIDECTOMY  [place].  Family History:  Family History  Problem Relation Age of Onset  . Colon cancer Maternal Grandmother   . Other Maternal Grandfather     pituitary tumor  . Diabetes Paternal Grandmother   . Heart Problems Paternal Grandfather     A fib, CHF    Social History:  Social History   Social History  . Marital status: Single    Spouse name: N/A  . Number of children: 0  . Years of education:  [employer]   Occupational History  . student     [employer]   Social History Main Topics  . Smoking status: Never Smoker  . Smokeless tobacco: Never Used  . Alcohol use No  . Drug use: No  . Sexual activity: No   Other Topics Concern  . None   Social History Narrative   Pt lives at home with family.   Caffeine Use: very little    Allergies:  Allergies  Allergen Reactions  . Cyanoacrylate Rash    Component of dermabond  . Clavulanic Acid Nausea And Vomiting    Pt can have penicillins  . Compazine [Prochlorperazine Edisylate] Other (See Comments)    COMBATIVE AND BELLIGERENT  . Ketamine Rash and Other (See Comments)    HALLUCINATIONS AND COMBATIVE  . Topamax [Topiramate] Hives and Rash    COMBATIVE  . Adhesive [Tape] Rash    BLISTERS  . Flagyl [Metronidazole] Rash  . Other Rash    Monocryl    Metabolic Disorder Labs: No results found for: HGBA1C, MPG No results found for: PROLACTIN No results found for: CHOL, TRIG, HDL, CHOLHDL, VLDL, LDLCALC   Current Medications: Current Outpatient Prescriptions  Medication Sig Dispense Refill  . cetirizine (ZYRTEC) 10 MG tablet Take 10 mg by mouth at bedtime.     Marland Kitchen dexlansoprazole (DEXILANT) 60 MG capsule Take 1 capsule (60 mg total) by mouth daily. 90 capsule 3  . EPINEPHrine (EPIPEN 2-PAK) 0.3 mg/0.3 mL IJ SOAJ injection Inject 0.3 mg into the muscle as needed.    Marland Kitchen escitalopram (LEXAPRO) 20 MG tablet Take 1.5 tablets (30 mg total) by mouth daily. 135 tablet 0  . hydrocortisone 2.5 % cream APPLY TOPICALLY TWICE A DAY 28 g 0  . hyoscyamine (LEVBID) 0.375 MG 12 hr tablet Take 1 tablet twice daily as needed for abdominal pain, cramping. 60 tablet 1  . hyoscyamine (LEVSIN/SL) 0.125 MG SL tablet Place 1 tablet (0.125 mg total) under the tongue every 4 (four) hours as needed. 30 tablet 4  . ibuprofen (ADVIL,MOTRIN) 400 MG tablet Take 400 mg by mouth Every 8 hours as needed for pain.    . indomethacin (INDOCIN) 25 MG capsule take 1 to 2  capsules WITH AMERGE FOR SEVERE HEADACHE every 12 hours if needed  0  . ketorolac (TORADOL) 60 MG/2ML SOLN injection     . magnesium oxide (MAG-OX) 400 MG tablet Take 800 mg by mouth daily.    . memantine (NAMENDA) 10 MG tablet Take 10 mg by mouth 2 (two) times daily.  0  . methylphenidate (CONCERTA) 18 MG PO CR tablet Take 1 tablet (18 mg total) by mouth 2 times daily at 12 noon and 4 pm. 60 tablet 0  . Naproxen Sodium 220 MG CAPS Take 220 mg by mouth.    . naratriptan (AMERGE) 2.5 MG tablet 2.5 mg at onset of headache, may repeat in 4 hours if needed    . ondansetron (ZOFRAN) 4 MG tablet Take  1 tablet (4 mg total) by mouth every 6 (six) hours as needed for nausea. 20 tablet 0  . ranitidine (ZANTAC) 150 MG tablet Take one or two po daily prn breakthrough GERD 60 tablet 3  . rizatriptan (MAXALT-MLT) 10 MG disintegrating tablet Take 10 mg by mouth.    Marland Kitchen tiZANidine (ZANAFLEX) 4 MG capsule 4 mg.    Marland Kitchen XIIDRA 5 % SOLN   1  . zolpidem (AMBIEN) 5 MG tablet Take 1 tablet (5 mg total) by mouth at bedtime as needed for sleep. 30 tablet 1   No current facility-administered medications for this visit.     Neurologic: Headache: Yes Seizure: No Paresthesias: No  Musculoskeletal: Strength & Muscle Tone: within normal limits Gait & Station: normal Patient leans: N/A  Psychiatric Specialty Exam: Review of Systems  Constitutional: Negative.   HENT: Negative.   Eyes: Negative.   Respiratory: Negative.   Cardiovascular: Negative.   Musculoskeletal: Negative.   Neurological: Positive for headaches.  Psychiatric/Behavioral: Negative.     Blood pressure 98/64, pulse 91, height 5\' 7"  (1.702 m), weight 147 lb 3.2 oz (66.8 kg).Body mass index is 23.05 kg/m.  General Appearance: Casual  Eye Contact:  Good  Speech:  Clear and Coherent  Volume:  Normal  Mood:  Euthymic  Affect:  Congruent  Thought Process:  Goal Directed  Orientation:  Full (Time, Place, and Person)  Thought Content: WDL and  Logical   Suicidal Thoughts:  No  Homicidal Thoughts:  No  Memory:  Immediate;   Good Recent;   Good Remote;   Good  Judgement:  Good  Insight:  Good  Psychomotor Activity:  Normal  Concentration:  Concentration: Good and Attention Span: Good  Recall:  Good  Fund of Knowledge: Good  Language: Good  Akathisia:  No  Handed:  Right  AIMS (if indicated):  0  Assets:  Communication Skills Desire for Improvement Financial Resources/Insurance [place] Talents/Skills Vocational/Educational  ADL's:  Intact  Cognition: WNL  Sleep:  good   Assessment: ADHD inattentive type.  Major depressive disorder, recurrent.  Plan: Discuss her current medication .  Discontinue clonidine as patient is not taking.  Restart Ambien 5 mg as needed for insomnia , Lexapro 30 mg daily and Concerta 18 mg twice a day.  Patient does not ask for early refills.  Discuss stimulant abuse, tolerance and withdrawal.  Recommended to call us back if she has any question, concern if she feels worsening of the symptom.  Follow-up in 3 months.    Brittnei Jagiello T., MD 06/20/2016, 4:30 PM

## 2016-08-03 DIAGNOSIS — M79674 Pain in right toe(s): Secondary | ICD-10-CM | POA: Insufficient documentation

## 2016-08-03 DIAGNOSIS — M89371 Hypertrophy of bone, right ankle and foot: Secondary | ICD-10-CM | POA: Insufficient documentation

## 2016-08-06 ENCOUNTER — Other Ambulatory Visit (HOSPITAL_COMMUNITY): Payer: Self-pay | Admitting: Psychiatry

## 2016-08-06 ENCOUNTER — Other Ambulatory Visit: Payer: Self-pay | Admitting: Gastroenterology

## 2016-08-06 DIAGNOSIS — F331 Major depressive disorder, recurrent, moderate: Secondary | ICD-10-CM

## 2016-08-08 ENCOUNTER — Ambulatory Visit (INDEPENDENT_AMBULATORY_CARE_PROVIDER_SITE_OTHER): Admitting: Psychiatry

## 2016-08-08 ENCOUNTER — Encounter (HOSPITAL_COMMUNITY): Payer: Self-pay | Admitting: Psychiatry

## 2016-08-08 VITALS — BP 108/80 | HR 75 | Ht 68.25 in | Wt 149.0 lb

## 2016-08-08 DIAGNOSIS — F331 Major depressive disorder, recurrent, moderate: Secondary | ICD-10-CM | POA: Diagnosis not present

## 2016-08-08 DIAGNOSIS — F419 Anxiety disorder, unspecified: Secondary | ICD-10-CM

## 2016-08-08 DIAGNOSIS — F900 Attention-deficit hyperactivity disorder, predominantly inattentive type: Secondary | ICD-10-CM | POA: Diagnosis not present

## 2016-08-08 MED ORDER — VENLAFAXINE HCL ER 37.5 MG PO CP24: ORAL_CAPSULE | ORAL | 1 refills | Status: DC

## 2016-08-08 MED ORDER — METHYLPHENIDATE HCL ER (OSM) 18 MG PO TBCR: 18.0000 mg | EXTENDED_RELEASE_TABLET | Freq: Two times a day (BID) | ORAL | 0 refills | Status: DC

## 2016-08-08 MED ORDER — ZOLPIDEM TARTRATE 5 MG PO TABS: 5.0000 mg | ORAL_TABLET | Freq: Every evening | ORAL | 1 refills | Status: DC | PRN

## 2016-08-08 NOTE — Progress Notes (Signed)
[facility] MD/PA/NP OP Progress Note  08/08/2016 4:18 PM Angela [name]  MRN:  [id number]  Chief Complaint:  Chief Complaint    Follow-up     Subjective:  I continued to have headaches.  My anxiety is getting worse.  I don't think Lexapro working.  HPI: Patient came for her follow-up appointment.  Last week she has excruciating headache and she was seen in the emergency room and given cocktail of Haldol and Toradol.  She endorse after that she was feeling miserable and she feels her anxiety and nervousness got worse.  Her neurologist put her back on Topamax and she is taking 50 mg.  She is taking multiple medication for headaches.  She admitted getting frustrated because of headaches.  She feels more nervous and anxious and some nights difficulty sleep.  Though she denies any crying spells or any feeling of hopelessness or worthlessness but endorsed that she feel anxious and sad.  Patient is a Ship broker at [employer].  Her ADD symptoms are well controlled with Concerta.  She is able to focus and able to do multitasking.  She admitted some nights she does not use CPAP machine because is still adjusting with a mask.  Patient lives with her parents.  She had a boyfriend and she mentioned her relationship is going very well.  Recently both went to [place] and had a good time.  Patient denies drinking alcohol or using any illegal substances.  Her energy level is fair.  She denies any aggression or any self abusive behavior.  Visit Diagnosis:    ICD-9-CM ICD-10-CM   1. Major depressive disorder, recurrent episode, moderate (HCC) 296.32 F33.1 venlafaxine XR (EFFEXOR XR) 37.5 MG 24 hr capsule     zolpidem (AMBIEN) 5 MG tablet    Past Psychiatric History:  Patient has history of ADD, anxiety and depression started school age.  She has psychological testing done by psychologist Mickey dew.  She has taken Remeron, amitriptyline, Focalin, clonidine in the past with limited response.   Patient denies any history of suicidal attempt or any psychiatric inpatient treatment.  Past Medical History:  Past Medical History:  Diagnosis Date  . Acne   . Biliary dyskinesia 11/2013   symptomatic  . Chronic constipation   . Closed head injury 2012  . Cluster headaches   . Complication of anesthesia    wakes up combative  . Delayed gastric emptying   . GERD (gastroesophageal reflux disease)   . Heart murmur   . Muscle spasms of neck    receives PT; needs to have neck support during surgery, per mother  . Pulmonary artery stenosis    left    Past Surgical History:  Procedure Laterality Date  . [place] STUDY N/A 07/26/2015   Procedure: [place] STUDY;  Surgeon: Manus Gunning, MD;  [facility];  Service: Gastroenterology;  Laterality: N/A;  . CHOLECYSTECTOMY N/A 12/09/2013   Procedure: LAPAROSCOPIC CHOLECYSTECTOMY WITH INTRAOPERATIVE CHOLANGIOGRAM;  Surgeon: Gwenyth Ober, MD;  Location: [place];  Service: General;  Laterality: N/A;  . ESOPHAGEAL MANOMETRY N/A 07/26/2015   Procedure: ESOPHAGEAL MANOMETRY (EM);  Surgeon: Manus Gunning, MD;  [facility];  Service: Gastroenterology;  Laterality: N/A;  . ESOPHAGOGASTRODUODENOSCOPY (EGD) WITH PROPOFOL  07/26/2012  . HYMENECTOMY  12/21/2006  . LAPAROSCOPIC APPENDECTOMY N/A 09/28/2013   Procedure: APPENDECTOMY LAPAROSCOPIC;  Surgeon: Gwenyth Ober, MD;  Location: [place];  Service: General;  Laterality: N/A;  . LAPAROSCOPIC CHOLECYSTECTOMY  12/09/2013  . NASAL SEPTUM SURGERY  2009   x 2  . ROBOTIC ASSISTED LAPAROSCOPIC OVARIAN CYSTECTOMY Right 01/20/2011  . TONSILLECTOMY AND ADENOIDECTOMY  1999    Family Psychiatric History: Reviewed.  Family History:  Family History  Problem Relation Age of Onset  . Colon cancer Maternal Grandmother   . Other Maternal Grandfather     pituitary tumor  . Diabetes Paternal Grandmother   . Heart Problems Paternal Grandfather     A fib, CHF    Social History:   Social History   Social History  . Marital status: Single    Spouse name: N/A  . Number of children: 0  . Years of education: [employer]   Occupational History  . student     [employer]   Social History Main Topics  . Smoking status: Never Smoker  . Smokeless tobacco: Never Used  . Alcohol use No  . Drug use: No  . Sexual activity: No   Other Topics Concern  . None   Social History Narrative   Pt lives at home with family.   Caffeine Use: very little    Allergies:  Allergies  Allergen Reactions  . Cyanoacrylate Rash    Component of dermabond  . Clavulanic Acid Nausea And Vomiting    Pt can have penicillins  . Compazine [Prochlorperazine Edisylate] Other (See Comments)    COMBATIVE AND BELLIGERENT  . Ketamine Rash and Other (See Comments)    HALLUCINATIONS AND COMBATIVE  . Topamax [Topiramate] Hives and Rash    COMBATIVE  . Haldol [Haloperidol] Other (See Comments)    Becomes combative and beligerent  . Adhesive [Tape] Rash    BLISTERS  . Flagyl [Metronidazole] Rash  . Other Rash    Monocryl    Metabolic Disorder Labs: No results found for: HGBA1C, MPG No results found for: PROLACTIN No results found for: CHOL, TRIG, HDL, CHOLHDL, VLDL, LDLCALC   Current Medications: Current Outpatient Prescriptions  Medication Sig Dispense Refill  . cetirizine (ZYRTEC) 10 MG tablet Take 10 mg by mouth at bedtime.     Marland Kitchen DEXILANT 60 MG capsule TAKE 1 CAPSULE DAILY 90 capsule 3  . EPINEPHrine (EPIPEN 2-PAK) 0.3 mg/0.3 mL IJ SOAJ injection Inject 0.3 mg into the muscle as needed.    . hydrocortisone 2.5 % cream APPLY TOPICALLY TWICE A DAY 28 g 0  . hyoscyamine (LEVBID) 0.375 MG 12 hr tablet Take 1 tablet twice daily as needed for abdominal pain, cramping. 60 tablet 1  . hyoscyamine (LEVSIN/SL) 0.125 MG SL tablet Place 1 tablet (0.125 mg total) under the tongue every 4 (four) hours as needed. 30 tablet 4  . ibuprofen (ADVIL,MOTRIN) 400 MG tablet Take 400 mg by mouth Every 8 hours  as needed for pain.    . indomethacin (INDOCIN) 25 MG capsule take 1 to 2 capsules WITH AMERGE FOR SEVERE HEADACHE every 12 hours if needed  0  . ketorolac (TORADOL) 60 MG/2ML SOLN injection     . magnesium oxide (MAG-OX) 400 MG tablet Take 800 mg by mouth daily.    . memantine (NAMENDA) 10 MG tablet Take 10 mg by mouth 2 (two) times daily.  0  . methylphenidate (CONCERTA) 18 MG PO CR tablet Take 1 tablet (18 mg total) by mouth 2 times daily at 12 noon and 4 pm. 60 tablet 0  . Naproxen Sodium 220 MG CAPS Take 220 mg by mouth.    . naratriptan (AMERGE) 2.5 MG tablet 2.5 mg at onset of headache,  may repeat in 4 hours if needed    . ondansetron (ZOFRAN) 4 MG tablet Take 1 tablet (4 mg total) by mouth every 6 (six) hours as needed for nausea. 20 tablet 0  . ranitidine (ZANTAC) 150 MG tablet Take one or two po daily prn breakthrough GERD 60 tablet 3  . tiZANidine (ZANAFLEX) 4 MG capsule 4 mg.    . Topiramate ER 100 MG CP24 Take 50 mg by mouth at bedtime.    Marland Kitchen XIIDRA 5 % SOLN   1  . zolpidem (AMBIEN) 5 MG tablet Take 1 tablet (5 mg total) by mouth at bedtime as needed for sleep. 30 tablet 1  . venlafaxine XR (EFFEXOR XR) 37.5 MG 24 hr capsule Take 1 capsule daily for 1 week and twice daily 60 capsule 1   No current facility-administered medications for this visit.     Neurologic: Headache: Yes Seizure: No Paresthesias: No  Musculoskeletal: Strength & Muscle Tone: within normal limits Gait & Station: normal Patient leans: N/A  Psychiatric Specialty Exam: Review of Systems  Constitutional: Negative.   Respiratory: Negative.   Cardiovascular: Negative.   Musculoskeletal: Negative.   Skin: Negative.   Neurological: Positive for headaches. Negative for tremors.  Psychiatric/Behavioral: The patient is nervous/anxious.     Blood pressure 108/80, pulse 75, height 5' 8.25" (1.734 m), weight 149 lb (67.6 kg), SpO2 97 %.Body mass index is 22.49 kg/m.  General Appearance: Casual and Shy  Eye  Contact:  Good  Speech:  Clear and Coherent  Volume:  Normal  Mood:  Anxious  Affect:  Appropriate  Thought Process:  Goal Directed  Orientation:  Full (Time, Place, and Person)  Thought Content: WDL and Logical   Suicidal Thoughts:  No  Homicidal Thoughts:  No  Memory:  Immediate;   Good Recent;   Good Remote;   Good  Judgement:  Good  Insight:  Good  Psychomotor Activity:  Normal  Concentration:  Concentration: Good and Attention Span: Good  Recall:  Good  Fund of Knowledge: Good  Language: Good  Akathisia:  No  Handed:  Right  AIMS (if indicated):  0  Assets:  Communication Skills Desire for [place] Talents/Skills Transportation Vocational/Educational  ADL's:  Intact  Cognition: WNL  Sleep:  Fair    Assessment: Major depressive disorder, recurrent.  Anxiety disorder NOS.  Attention deficit disorder inattentive type  Plan: I reviewed records from her emergency room visit.  Explained that Haldol is antipsychotic medication and she may have side effects including restlessness from injection.  Patient is frustrated with her chronic headaches.  She also endorse increased anxiety.  I will discontinue Lexapro and we will try Effexor 37.5 mg twice a day which she has never tried before.  Continue Concerta at present dose since it is helping her attention focus and multitasking.  Continue Ambien as needed for insomnia.  Patient is taking multiple medication discussed polypharmacy in detail.  Follow-up in 2 months.Discussed medication side effects and benefits.  Recommended to call us back if there is any question, concern or worsening of the symptoms.  Discuss safety plan that anytime having active suicidal thoughts or homicidal thoughts and she need to call 911 or go to the local emergency room.  Alley Neils T., MD 08/08/2016, 4:18 PM

## 2016-08-10 ENCOUNTER — Other Ambulatory Visit (HOSPITAL_COMMUNITY): Payer: Self-pay | Admitting: Psychiatry

## 2016-08-10 DIAGNOSIS — F331 Major depressive disorder, recurrent, moderate: Secondary | ICD-10-CM

## 2016-09-04 ENCOUNTER — Other Ambulatory Visit (HOSPITAL_COMMUNITY): Payer: Self-pay | Admitting: Psychiatry

## 2016-09-04 DIAGNOSIS — F331 Major depressive disorder, recurrent, moderate: Secondary | ICD-10-CM

## 2016-09-11 NOTE — Addendum Note (Signed)
Addended by: Felipa Emory on: 09/11/2016 02:42 PM   Modules accepted: Orders

## 2016-09-11 NOTE — Telephone Encounter (Signed)
Met with Dr. Adele Schilder who authorized a one time refill to last until patient returns to see him on 10/12/16 as patient will run out before appointment.  New 30 day order e-scribed to [facility] this date as approved by Dr. Adele Schilder.

## 2016-09-19 ENCOUNTER — Other Ambulatory Visit (HOSPITAL_COMMUNITY): Payer: Self-pay | Admitting: Psychiatry

## 2016-09-19 DIAGNOSIS — F331 Major depressive disorder, recurrent, moderate: Secondary | ICD-10-CM

## 2016-10-02 ENCOUNTER — Telehealth: Payer: Self-pay | Admitting: Nurse Practitioner

## 2016-10-02 NOTE — Telephone Encounter (Signed)
No answer. Left a message to call back.

## 2016-10-02 NOTE — Telephone Encounter (Signed)
Patient reports a normal bowel movement yesterday. No bowel movement since. She began having abdominal pain in her middle yesterday evening. She describest the pain as sharp, stabbing and comes in waves. Today she has had nausea and vomiting. Vomited most recently 3 hours ago. Chills. Unsure if she has fever. No app appointment until next week. I have asked her to call her PCP, go to urgent care or the ER.

## 2016-10-12 ENCOUNTER — Encounter (HOSPITAL_COMMUNITY): Payer: Self-pay | Admitting: Psychiatry

## 2016-10-12 ENCOUNTER — Ambulatory Visit (INDEPENDENT_AMBULATORY_CARE_PROVIDER_SITE_OTHER): Payer: Managed Care, Other (non HMO) | Admitting: Psychiatry

## 2016-10-12 DIAGNOSIS — F419 Anxiety disorder, unspecified: Secondary | ICD-10-CM | POA: Diagnosis not present

## 2016-10-12 DIAGNOSIS — F331 Major depressive disorder, recurrent, moderate: Secondary | ICD-10-CM | POA: Diagnosis not present

## 2016-10-12 DIAGNOSIS — Z79899 Other long term (current) drug therapy: Secondary | ICD-10-CM

## 2016-10-12 DIAGNOSIS — F900 Attention-deficit hyperactivity disorder, predominantly inattentive type: Secondary | ICD-10-CM

## 2016-10-12 MED ORDER — METHYLPHENIDATE HCL ER (OSM) 18 MG PO TBCR: 18.0000 mg | EXTENDED_RELEASE_TABLET | Freq: Every day | ORAL | 0 refills | Status: DC

## 2016-10-12 MED ORDER — VENLAFAXINE HCL ER 75 MG PO CP24: 75.0000 mg | ORAL_CAPSULE | Freq: Every day | ORAL | 0 refills | Status: DC

## 2016-10-12 NOTE — Progress Notes (Signed)
BH MD/PA/NP OP Progress Note  10/12/2016 8:37 AM Angela [name]  MRN:  [id number]  Chief Complaint:  Subjective:  I like Effexor.  My anxiety is better.  HPI: Angela [name] came for her follow-up appointment.  She is taking Effexor 37.5 mg twice a day and stop the Lexapro which was discontinued because it stopped working.  She has noticed overall less anxious and less depressed.  However she is been very tired recently.  She is working full-time at [employer] and also Charity fundraiser at [employer].  She continues to have headaches and neck pain and seen a neurologist last month.  Patient was disappointed when neurologist mention that nothing they can do to help the headaches.  She was recommended to try melatonin.  She is no longer taking Ambien and also cut down her stimulants.  Patient lives with her parents.  Lately she's been sick and not able to use CPAP machine.  She also noncompliant with therapy with [facility].  She mentioned that she's been very busy and does not have time for counseling.  Patient denies any feeling of hopelessness or worthlessness.  She denies any suicidal thoughts or homicidal thoughts.  She has no tremors shakes or any EPS.  Her relationship with the boyfriend is going very well.  Patient is hoping to graduate next May.  She is also taking summer classes.  Patient denies any major panic attack or any anhedonia.  Her appetite is okay.  Her vital signs are stable.  Visit Diagnosis:    ICD-9-CM ICD-10-CM   1. Major depressive disorder, recurrent episode, moderate (HCC) 296.32 F33.1 venlafaxine XR (EFFEXOR-XR) 75 MG 24 hr capsule  2. Attention deficit hyperactivity disorder (ADHD), predominantly inattentive type 314.00 F90.0 methylphenidate (CONCERTA) 18 MG PO CR tablet    Past Psychiatric History: Reviewed. Patient has history of ADD, anxiety and depression started school age.  She has psychological testing done by psychologist Mickey dew.  She  has taken Remeron, amitriptyline, Focalin, clonidine in the past with limited response.   recently she tried Lexapro but are some time it stopped working.  Patient denies any history of suicidal attempt or any psychiatric inpatient treatment.  Past Medical History:  Past Medical History:  Diagnosis Date  . Acne   . Biliary dyskinesia 11/2013   symptomatic  . Chronic constipation   . Closed head injury 2012  . Cluster headaches   . Complication of anesthesia    wakes up combative  . Delayed gastric emptying   . GERD (gastroesophageal reflux disease)   . Heart murmur   . Muscle spasms of neck    receives PT; needs to have neck support during surgery, per mother  . Pulmonary artery stenosis    left    Past Surgical History:  Procedure Laterality Date  . [place] STUDY N/A 07/26/2015   Procedure: [place] STUDY;  Surgeon: Manus Gunning, MD;  [facility];  Service: Gastroenterology;  Laterality: N/A;  . CHOLECYSTECTOMY N/A 12/09/2013   Procedure: LAPAROSCOPIC CHOLECYSTECTOMY WITH INTRAOPERATIVE CHOLANGIOGRAM;  Surgeon: Gwenyth Ober, MD;  Location: [facility];  Service: General;  Laterality: N/A;  . ESOPHAGEAL MANOMETRY N/A 07/26/2015   Procedure: ESOPHAGEAL MANOMETRY (EM);  Surgeon: Manus Gunning, MD;  [facility];  Service: Gastroenterology;  Laterality: N/A;  . ESOPHAGOGASTRODUODENOSCOPY (EGD) WITH PROPOFOL  07/26/2012  . HYMENECTOMY  12/21/2006  . LAPAROSCOPIC APPENDECTOMY N/A 09/28/2013   Procedure: APPENDECTOMY LAPAROSCOPIC;  Surgeon: Gwenyth Ober,  MD;  Location: [facility];  Service: General;  Laterality: N/A;  . LAPAROSCOPIC CHOLECYSTECTOMY  12/09/2013  . NASAL SEPTUM SURGERY  2009   x 2  . ROBOTIC ASSISTED LAPAROSCOPIC OVARIAN CYSTECTOMY Right 01/20/2011  . TONSILLECTOMY AND ADENOIDECTOMY  1999    Family Psychiatric History: Reviewed.  Family History:  Family History  Problem Relation Age of Onset  . Colon cancer Maternal Grandmother   .  Other Maternal Grandfather        pituitary tumor  . Diabetes Paternal Grandmother   . Heart Problems Paternal Grandfather        A fib, CHF    Social History:  Social History   Social History  . Marital status: Single    Spouse name: N/A  . Number of children: 0  . Years of education: [employer]   Occupational History  . student     [employer]   Social History Main Topics  . Smoking status: Never Smoker  . Smokeless tobacco: Never Used  . Alcohol use No  . Drug use: No  . Sexual activity: No   Other Topics Concern  . Not on file   Social History Narrative   Pt lives at home with family.   Caffeine Use: very little    Allergies:  Allergies  Allergen Reactions  . Cyanoacrylate Rash    Component of dermabond  . Clavulanic Acid Nausea And Vomiting    Pt can have penicillins  . Compazine [Prochlorperazine Edisylate] Other (See Comments)    COMBATIVE AND BELLIGERENT  . Ketamine Rash and Other (See Comments)    HALLUCINATIONS AND COMBATIVE  . Topamax [Topiramate] Hives and Rash    COMBATIVE  . Haldol [Haloperidol] Other (See Comments)    Becomes combative and beligerent  . Adhesive [Tape] Rash    BLISTERS  . Flagyl [Metronidazole] Rash  . Other Rash    Monocryl    Metabolic Disorder Labs: No results found for: HGBA1C, MPG No results found for: PROLACTIN No results found for: CHOL, TRIG, HDL, CHOLHDL, VLDL, LDLCALC   Current Medications: Current Outpatient Prescriptions  Medication Sig Dispense Refill  . cetirizine (ZYRTEC) 10 MG tablet Take 10 mg by mouth at bedtime.     Marland Kitchen DEXILANT 60 MG capsule TAKE 1 CAPSULE DAILY 90 capsule 3  . EPINEPHrine (EPIPEN 2-PAK) 0.3 mg/0.3 mL IJ SOAJ injection Inject 0.3 mg into the muscle as needed.    . hydrocortisone 2.5 % cream APPLY TOPICALLY TWICE A DAY 28 g 0  . hyoscyamine (LEVBID) 0.375 MG 12 hr tablet Take 1 tablet twice daily as needed for abdominal pain, cramping. 60 tablet 1  . ibuprofen (ADVIL,MOTRIN) 400 MG tablet  Take 400 mg by mouth Every 8 hours as needed for pain.    . indomethacin (INDOCIN) 25 MG capsule take 1 to 2 capsules WITH AMERGE FOR SEVERE HEADACHE every 12 hours if needed  0  . ketorolac (TORADOL) 60 MG/2ML SOLN injection     . magnesium oxide (MAG-OX) 400 MG tablet Take 800 mg by mouth daily.    . memantine (NAMENDA) 10 MG tablet Take 10 mg by mouth 2 (two) times daily.  0  . methylphenidate (CONCERTA) 18 MG PO CR tablet Take 1 tablet (18 mg total) by mouth 2 times daily at 12 noon and 4 pm. 60 tablet 0  . Naproxen Sodium 220 MG CAPS Take 220 mg by mouth.    . naratriptan (AMERGE) 2.5 MG tablet 2.5 mg at onset of headache, may repeat  in 4 hours if needed    . ondansetron (ZOFRAN) 4 MG tablet Take 1 tablet (4 mg total) by mouth every 6 (six) hours as needed for nausea. 20 tablet 0  . ranitidine (ZANTAC) 150 MG tablet Take one or two po daily prn breakthrough GERD 60 tablet 3  . tiZANidine (ZANAFLEX) 4 MG capsule 4 mg.    . Topiramate ER 100 MG CP24 Take 50 mg by mouth at bedtime.    Marland Kitchen venlafaxine XR (EFFEXOR-XR) 37.5 MG 24 hr capsule Take 2 capsules (75 mg total) by mouth daily. 60 capsule 0  . XIIDRA 5 % SOLN   1  . zolpidem (AMBIEN) 5 MG tablet Take 1 tablet (5 mg total) by mouth at bedtime as needed for sleep. 30 tablet 1   No current facility-administered medications for this visit.     Neurologic: Headache: Yes Seizure: No Paresthesias: No  Musculoskeletal: Strength & Muscle Tone: within normal limits Gait & Station: normal Patient leans: N/A  Psychiatric Specialty Exam: Review of Systems  Constitutional:       Tired  HENT: Negative.   Cardiovascular: Negative for chest pain.  Musculoskeletal: Positive for neck pain.  Skin: Negative.   Neurological: Positive for headaches.    Blood pressure 116/74, pulse 85, height 5\' 7"  (1.702 m), weight 150 lb (68 kg).There is no height or weight on file to calculate BMI.  General Appearance: Fairly Groomed and Shy  Eye Contact:   Good  Speech:  Slow  Volume:  Normal  Mood:  Euthymic  Affect:  Appropriate  Thought Process:  Goal Directed  Orientation:  Full (Time, Place, and Person)  Thought Content: WDL and Logical   Suicidal Thoughts:  No  Homicidal Thoughts:  No  Memory:  Immediate;   Good Recent;   Good Remote;   Good  Judgement:  Good  Insight:  Good  Psychomotor Activity:  Normal  Concentration:  Concentration: Good and Attention Span: Good  Recall:  Good  Fund of Knowledge: Good  Language: Good  Akathisia:  No  Handed:  Right  AIMS (if indicated):  0  Assets:  Communication Skills Desire for [place] Talents/Skills Transportation Vocational/Educational  ADL's:  Intact  Cognition: WNL  Sleep:  Fair     Assessment: Major depressive disorder, recurrent.  Anxiety disorder NOS.  Attention deficit disorder inattentive type  Plan: Patient doing better on Effexor.  Recommended to continue Effexor 75 mg in the morning.  Patient has cut down her stimulant and only taking 18 mg Concerta once a day.  Her attention and focus is better.  I would discontinue Ambien as patient is taking melatonin.  Discussed medication side effects and benefits.  Patient does not have time for counseling.  She is taking summer classes.  I recommended to call us back if she has any question, concern or if she feels worsening of the symptom.  Follow-up in 3 months.  ARFEEN,SYED T., MD 10/12/2016, 8:37 AM

## 2016-10-14 ENCOUNTER — Encounter (HOSPITAL_COMMUNITY): Payer: Self-pay | Admitting: Emergency Medicine

## 2016-10-14 ENCOUNTER — Emergency Department (HOSPITAL_COMMUNITY)
Admission: EM | Admit: 2016-10-14 | Discharge: 2016-10-14 | Disposition: A | Discharge status: Home / Self Care | Payer: Managed Care, Other (non HMO) | Attending: Emergency Medicine | Admitting: Emergency Medicine

## 2016-10-14 DIAGNOSIS — R42 Dizziness and giddiness: Secondary | ICD-10-CM | POA: Insufficient documentation

## 2016-10-14 DIAGNOSIS — F909 Attention-deficit hyperactivity disorder, unspecified type: Secondary | ICD-10-CM | POA: Diagnosis not present

## 2016-10-14 DIAGNOSIS — R51 Headache: Secondary | ICD-10-CM | POA: Diagnosis not present

## 2016-10-14 DIAGNOSIS — Z79899 Other long term (current) drug therapy: Secondary | ICD-10-CM | POA: Diagnosis not present

## 2016-10-14 LAB — I-STAT CHEM 8, ED
BUN: 9 mg/dL (ref 6–20)
Calcium, Ion: 1.28 mmol/L (ref 1.15–1.40)
Chloride: 106 mmol/L (ref 101–111)
Creatinine, Ser: 0.8 mg/dL (ref 0.44–1.00)
Glucose, Bld: 87 mg/dL (ref 65–99)
HEMATOCRIT: 42 % (ref 36.0–46.0)
Hemoglobin: 14.3 g/dL (ref 12.0–15.0)
POTASSIUM: 3.8 mmol/L (ref 3.5–5.1)
SODIUM: 143 mmol/L (ref 135–145)
TCO2: 24 mmol/L (ref 0–100)

## 2016-10-14 LAB — I-STAT BETA HCG BLOOD, ED (MC, WL, AP ONLY): I-stat hCG, quantitative: <5 m[IU]/mL (ref <5)

## 2016-10-14 MED ORDER — MECLIZINE HCL 25 MG PO TABS: 25.0000 mg | ORAL_TABLET | Freq: Three times a day (TID) | ORAL | 0 refills | Status: AC | PRN

## 2016-10-14 MED ORDER — MECLIZINE HCL 25 MG PO TABS
25.0000 mg | ORAL_TABLET | Freq: Once | ORAL | Status: AC
  Administered 2016-10-14: 25 mg via ORAL
  Filled 2016-10-14: qty 1

## 2016-10-14 MED ORDER — ONDANSETRON HCL 4 MG/2ML IJ SOLN
4.0000 mg | Freq: Once | INTRAMUSCULAR | Status: AC
  Administered 2016-10-14: 4 mg via INTRAVENOUS
  Filled 2016-10-14: qty 2

## 2016-10-14 MED ORDER — SODIUM CHLORIDE 0.9 % IV BOLUS (SEPSIS)
1000.0000 mL | Freq: Once | INTRAVENOUS | Status: AC
  Administered 2016-10-14: 1000 mL via INTRAVENOUS

## 2016-10-14 MED ORDER — ONDANSETRON HCL 4 MG PO TABS: 4.0000 mg | ORAL_TABLET | Freq: Four times a day (QID) | ORAL | 0 refills | Status: DC

## 2016-10-14 NOTE — ED Notes (Signed)
ED Provider at bedside. 

## 2016-10-14 NOTE — ED Provider Notes (Signed)
[facility] DEPT Provider Note   CSN: [id number] Arrival date & time: 10/14/16  1354     History   Chief Complaint Chief Complaint  Patient presents with  . Dizziness  . Headache  . Visual Field Change    HPI Angela [name] is a 21 y.o. female.  The history is provided by the patient.  Dizziness  Quality:  Vertigo and room spinning Severity:  Mild Onset quality:  Gradual Duration:  2 days Timing:  Intermittent Progression:  Waxing and waning Chronicity:  New Context: bending over and head movement   Context: not with inactivity, not with loss of consciousness and not with physical activity   Relieved by:  Being still Worsened by:  Turning head and movement Associated symptoms: headaches (chronic, at baseline today) and vision changes   Associated symptoms: no blood in stool, no chest pain, no diarrhea, no hearing loss, no nausea, no palpitations, no shortness of breath, no syncope, no tinnitus, no vomiting and no weakness   Risk factors: no hx of vertigo     Past Medical History:  Diagnosis Date  . Acne   . Biliary dyskinesia 11/2013   symptomatic  . Chronic constipation   . Closed head injury 2012  . Cluster headaches   . Complication of anesthesia    wakes up combative  . Delayed gastric emptying   . GERD (gastroesophageal reflux disease)   . Heart murmur   . Muscle spasms of neck    receives PT; needs to have neck support during surgery, per mother  . Pulmonary artery stenosis    left    Patient Active Problem List   Diagnosis Date Noted  . Dysphagia   . Esophageal reflux   . Gastroesophageal reflux disease with esophagitis 03/10/2015  . Major depression, recurrent, chronic ([place]) 02/09/2015  . GAD (generalized anxiety disorder) 02/09/2015  . ADHD (attention deficit hyperactivity disorder) 02/09/2015  . Rhinitis, allergic 02/06/2015  . Gastroparesis 12/10/2013  . Cholecystitis, chronic 12/09/2013  . Symptomatic biliary dyskinesia 12/02/2013  .  Abdominal pain, unspecified site 10/30/2013  . Nausea alone 10/30/2013  . Constipation 10/30/2013  . Postop check 10/14/2013  . Postoperative wound infection 10/06/2013  . Acute appendicitis 09/28/2013  . Migraine variant 10/30/2012  . Tension headache 10/30/2012  . Bilateral occipital neuralgia 10/30/2012  . GERD (gastroesophageal reflux disease) 07/15/2012    Past Surgical History:  Procedure Laterality Date  . [place] STUDY N/A 07/26/2015   Procedure: [place] STUDY;  Surgeon: Manus Gunning, MD;  [facility];  Service: Gastroenterology;  Laterality: N/A;  . CHOLECYSTECTOMY N/A 12/09/2013   Procedure: LAPAROSCOPIC CHOLECYSTECTOMY WITH INTRAOPERATIVE CHOLANGIOGRAM;  Surgeon: Gwenyth Ober, MD;  Location: [facility];  Service: General;  Laterality: N/A;  . ESOPHAGEAL MANOMETRY N/A 07/26/2015   Procedure: ESOPHAGEAL MANOMETRY (EM);  Surgeon: Manus Gunning, MD;  [facility];  Service: Gastroenterology;  Laterality: N/A;  . ESOPHAGOGASTRODUODENOSCOPY (EGD) WITH PROPOFOL  07/26/2012  . HYMENECTOMY  12/21/2006  . LAPAROSCOPIC APPENDECTOMY N/A 09/28/2013   Procedure: APPENDECTOMY LAPAROSCOPIC;  Surgeon: Gwenyth Ober, MD;  Location: [facility];  Service: General;  Laterality: N/A;  . LAPAROSCOPIC CHOLECYSTECTOMY  12/09/2013  . NASAL SEPTUM SURGERY  2009   x 2  . ROBOTIC ASSISTED LAPAROSCOPIC OVARIAN CYSTECTOMY Right 01/20/2011  . TONSILLECTOMY AND ADENOIDECTOMY  1999    OB History    No data available       Home Medications    Prior to Admission medications  Medication Sig Start Date End Date Taking? Authorizing Provider  cetirizine (ZYRTEC) 10 MG tablet Take 10 mg by mouth at bedtime.     [provider]  DEXILANT 60 MG capsule TAKE 1 CAPSULE DAILY 08/07/16   Zehr, Laban Emperor, PA-C  EPINEPHrine (EPIPEN 2-PAK) 0.3 mg/0.3 mL IJ SOAJ injection Inject 0.3 mg into the muscle as needed.    [provider]  hydrocortisone 2.5 % cream APPLY  TOPICALLY TWICE A DAY 11/01/15   Gean Quint, MD  hyoscyamine (LEVBID) 0.375 MG 12 hr tablet Take 1 tablet twice daily as needed for abdominal pain, cramping. 06/08/16   Willia Craze, NP  ibuprofen (ADVIL,MOTRIN) 400 MG tablet Take 400 mg by mouth Every 8 hours as needed for pain.    [provider]  indomethacin (INDOCIN) 25 MG capsule take 1 to 2 capsules WITH AMERGE FOR SEVERE HEADACHE every 12 hours if needed 02/25/16   [provider]  ketorolac (TORADOL) 60 MG/2ML SOLN injection  03/08/16   [provider]  magnesium oxide (MAG-OX) 400 MG tablet Take 800 mg by mouth daily.    [provider]  meclizine (ANTIVERT) 25 MG tablet Take 1 tablet (25 mg total) by mouth 3 (three) times daily as needed for dizziness. 10/14/16 10/24/16  Ervan Heber, DO  memantine (NAMENDA) 10 MG tablet Take 10 mg by mouth 2 (two) times daily. 02/08/16   [provider]  methylphenidate (CONCERTA) 18 MG PO CR tablet Take 1 tablet (18 mg total) by mouth daily. 10/12/16 10/12/17  Arfeen, Arlyce Harman, MD  Naproxen Sodium 220 MG CAPS Take 220 mg by mouth. 06/12/12   [provider]  naratriptan (AMERGE) 2.5 MG tablet 2.5 mg at onset of headache, may repeat in 4 hours if needed 01/17/16   [provider]  ondansetron (ZOFRAN) 4 MG tablet Take 1 tablet (4 mg total) by mouth every 6 (six) hours. 10/14/16   Lennice Sites, DO  ranitidine (ZANTAC) 150 MG tablet Take one or two po daily prn breakthrough GERD 01/14/16   Milus Banister, MD  tiZANidine (ZANAFLEX) 4 MG capsule 4 mg. 09/20/15   [provider]  Topiramate ER 100 MG CP24 Take 50 mg by mouth at bedtime. 10/12/15   [provider]  venlafaxine XR (EFFEXOR-XR) 75 MG 24 hr capsule Take 1 capsule (75 mg total) by mouth daily with breakfast. 10/12/16   Arfeen, Arlyce Harman, MD  XIIDRA 5 % SOLN  02/22/16   [provider]    Family History Family History  Problem Relation Age of Onset  . Colon  cancer Maternal Grandmother   . Other Maternal Grandfather        pituitary tumor  . Diabetes Paternal Grandmother   . Heart Problems Paternal Grandfather        A fib, CHF    Social History Social History  Substance Use Topics  . Smoking status: Never Smoker  . Smokeless tobacco: Never Used  . Alcohol use No     Allergies   Cyanoacrylate; Clavulanic acid; Compazine [prochlorperazine edisylate]; Ketamine; Prednisone; Topamax [topiramate]; Haldol [haloperidol]; Adhesive [tape]; Flagyl [metronidazole]; and Other   Review of Systems Review of Systems  Constitutional: Negative for chills and fever.  HENT: Negative for ear pain, hearing loss, sore throat and tinnitus.   Eyes: Positive for visual disturbance. Negative for pain.  Respiratory: Negative for cough and shortness of breath.   Cardiovascular: Negative for chest pain, palpitations and syncope.  Gastrointestinal: Negative for  abdominal pain, blood in stool, diarrhea, nausea and vomiting.  Genitourinary: Negative for dysuria and hematuria.  Musculoskeletal: Negative for arthralgias and back pain.  Skin: Negative for color change and rash.  Neurological: Positive for dizziness and headaches (chronic, at baseline today). Negative for seizures, syncope and weakness.  All other systems reviewed and are negative.    Physical Exam Updated Vital Signs  ED Triage Vitals  Enc Vitals Group     BP 10/14/16 1444 96/77     Pulse Rate 10/14/16 1444 (!) 111     Resp 10/14/16 1444 17     Temp 10/14/16 1444 98 F (36.7 C)     Temp Source 10/14/16 1444 Oral     SpO2 10/14/16 1444 97 %     Weight 10/14/16 1445 150 lb (68 kg)     Height 10/14/16 1445 5\' 7"  (1.702 m)     Head Circumference --      Peak Flow --      Pain Score 10/14/16 1443 4     Pain Loc --      Pain Edu? --      Excl. in [place]? --     Physical Exam  Constitutional: She is oriented to person, place, and time. She appears well-developed and well-nourished. No  distress.  HENT:  Head: Normocephalic and atraumatic.  Eyes: Conjunctivae and EOM are normal. Pupils are equal, round, and reactive to light.  20/20 vision b/l  Neck: Normal range of motion. Neck supple.  Cardiovascular: Normal rate, regular rhythm, normal heart sounds and intact distal pulses.   No murmur heard. Pulmonary/Chest: Effort normal and breath sounds normal. No respiratory distress.  Abdominal: Soft. There is no tenderness.  Musculoskeletal: She exhibits no edema.  Neurological: She is alert and oriented to person, place, and time. No cranial nerve deficit or sensory deficit. She exhibits normal muscle tone. Coordination normal.  No drift, normal finger to nose finger, normal heal to shin, 5+/5 strength, normal sensation   Skin: Skin is warm and dry. Capillary refill takes less than 2 seconds.  Psychiatric: She has a normal mood and affect.  Nursing note and vitals reviewed.    ED Treatments / Results  Labs (all labs ordered are listed, but only abnormal results are displayed) Labs Reviewed  I-STAT CHEM 8, ED  I-STAT BETA HCG BLOOD, ED ([facility], [facility], [facility] ONLY)    EKG  EKG Interpretation None       Radiology No results found.  Procedures Procedures (including critical care time)  Medications Ordered in ED Medications  meclizine (ANTIVERT) tablet 25 mg (25 mg Oral Given 10/14/16 1849)  ondansetron (ZOFRAN) injection 4 mg (4 mg Intravenous Given 10/14/16 1901)  sodium chloride 0.9 % bolus 1,000 mL (1,000 mLs Intravenous New Bag/Given 10/14/16 1901)     Initial Impression / Assessment and Plan / ED Course  I have reviewed the triage vital signs and the nursing notes.  Pertinent labs & imaging results that were available during my care of the patient were reviewed by me and considered in my medical decision making (see chart for details).     QUANTASIA STEGNER is a 21 year old female with history of depression, migraines, reflux who presents to the ED with vertigo like  symptoms. Patient's vitals at time of arrival to the ED are unremarkable and patient is without fever. Patient with dizziness that started last night with difficulty focusing with her eyes. Patient states that every time she moves her head up and down  she feels like the room is spinning. When she is at rest and keeps her head still symptoms improve. She denies new headache, chest pain, shortness of breath, abdominal pain. Patient denies fever, chills, neck stiffness. Patient with normal neurological exam except for a positive Dix-Hallpike suggestive of peripheral vertigo. Patient has normal cerebellar signs, no vertical nystagmus. Patient has 20/20 vision bilaterally and do not believe patient has central source of her vertigo. Patient does not have chest pain or syncopal episodes. We will treat with normal saline bolus, meclizine, Zofran. We'll get i-STAT chem 8 to evaluate for electrolyte abnormalities, anemia.  Labs unremarkable with no significant anemia or electrolyte abnormalities. Patient felt improvement following meclizine and fluids and was discharged to home with prescription for meclizine and Zofran. Recommend follow-up her primary care provider if symptoms do not resolve as she may need referral to ENT. Patient given return precautions and left the ED in good condition.  Final Clinical Impressions(s) / ED Diagnoses   Final diagnoses:  Vertigo    New Prescriptions New Prescriptions   MECLIZINE (ANTIVERT) 25 MG TABLET    Take 1 tablet (25 mg total) by mouth 3 (three) times daily as needed for dizziness.   ONDANSETRON (ZOFRAN) 4 MG TABLET    Take 1 tablet (4 mg total) by mouth every 6 (six) hours.     Lennice Sites, DO 10/14/16 1951    Merrily Pew, MD 10/14/16 [phone]

## 2016-10-16 DIAGNOSIS — H93293 Other abnormal auditory perceptions, bilateral: Secondary | ICD-10-CM | POA: Insufficient documentation

## 2016-10-16 DIAGNOSIS — R42 Dizziness and giddiness: Secondary | ICD-10-CM | POA: Insufficient documentation

## 2016-10-16 DIAGNOSIS — R2689 Other abnormalities of gait and mobility: Secondary | ICD-10-CM | POA: Insufficient documentation

## 2016-10-17 ENCOUNTER — Telehealth (HOSPITAL_COMMUNITY): Payer: Self-pay

## 2016-10-17 NOTE — Telephone Encounter (Signed)
Patient is calling today because she had an appointment with Neurology regarding her migraines. The Neurologist would like to start her on Lamotrigine, but wanted your input first. Please review and advise, thank you

## 2016-10-19 NOTE — Telephone Encounter (Signed)
She can try Lamictal but he usually in the beginning Lamictal makes Headache worse.  She has to watch carefully about the rash.

## 2016-10-19 NOTE — Telephone Encounter (Signed)
Called patient and informed her of what Dr.Arfeen said, patient understands

## 2017-01-08 ENCOUNTER — Ambulatory Visit (HOSPITAL_COMMUNITY): Admitting: Psychiatry

## 2017-01-10 ENCOUNTER — Other Ambulatory Visit (HOSPITAL_COMMUNITY): Payer: Self-pay | Admitting: Psychiatry

## 2017-01-10 ENCOUNTER — Telehealth (HOSPITAL_COMMUNITY): Payer: Self-pay

## 2017-01-10 DIAGNOSIS — F900 Attention-deficit hyperactivity disorder, predominantly inattentive type: Secondary | ICD-10-CM

## 2017-01-10 DIAGNOSIS — F331 Major depressive disorder, recurrent, moderate: Secondary | ICD-10-CM

## 2017-01-10 MED ORDER — METHYLPHENIDATE HCL ER (OSM) 18 MG PO TBCR: 18.0000 mg | EXTENDED_RELEASE_TABLET | Freq: Every day | ORAL | 0 refills | Status: DC

## 2017-01-10 MED ORDER — VENLAFAXINE HCL ER 75 MG PO CP24: 75.0000 mg | ORAL_CAPSULE | Freq: Every day | ORAL | 0 refills | Status: DC

## 2017-01-10 NOTE — Telephone Encounter (Signed)
Patient had to move appointment out to the end of the month and needs a refill on Concerta and Venlafaxine, she needs 90 day orders of both for [facility]) Please review and advise, thank you

## 2017-01-17 ENCOUNTER — Other Ambulatory Visit (HOSPITAL_COMMUNITY): Payer: Self-pay | Admitting: Psychiatry

## 2017-01-23 ENCOUNTER — Ambulatory Visit (HOSPITAL_COMMUNITY): Payer: Self-pay | Admitting: Psychiatry

## 2017-01-28 IMAGING — CR DG CHEST 2V
2 series · 2 of 2 positions shown · non-contrast
Comparison: July 21, 2012

CLINICAL DATA: Upper abdominal pain

EXAM:
CHEST  2 VIEW

[w chest pa]
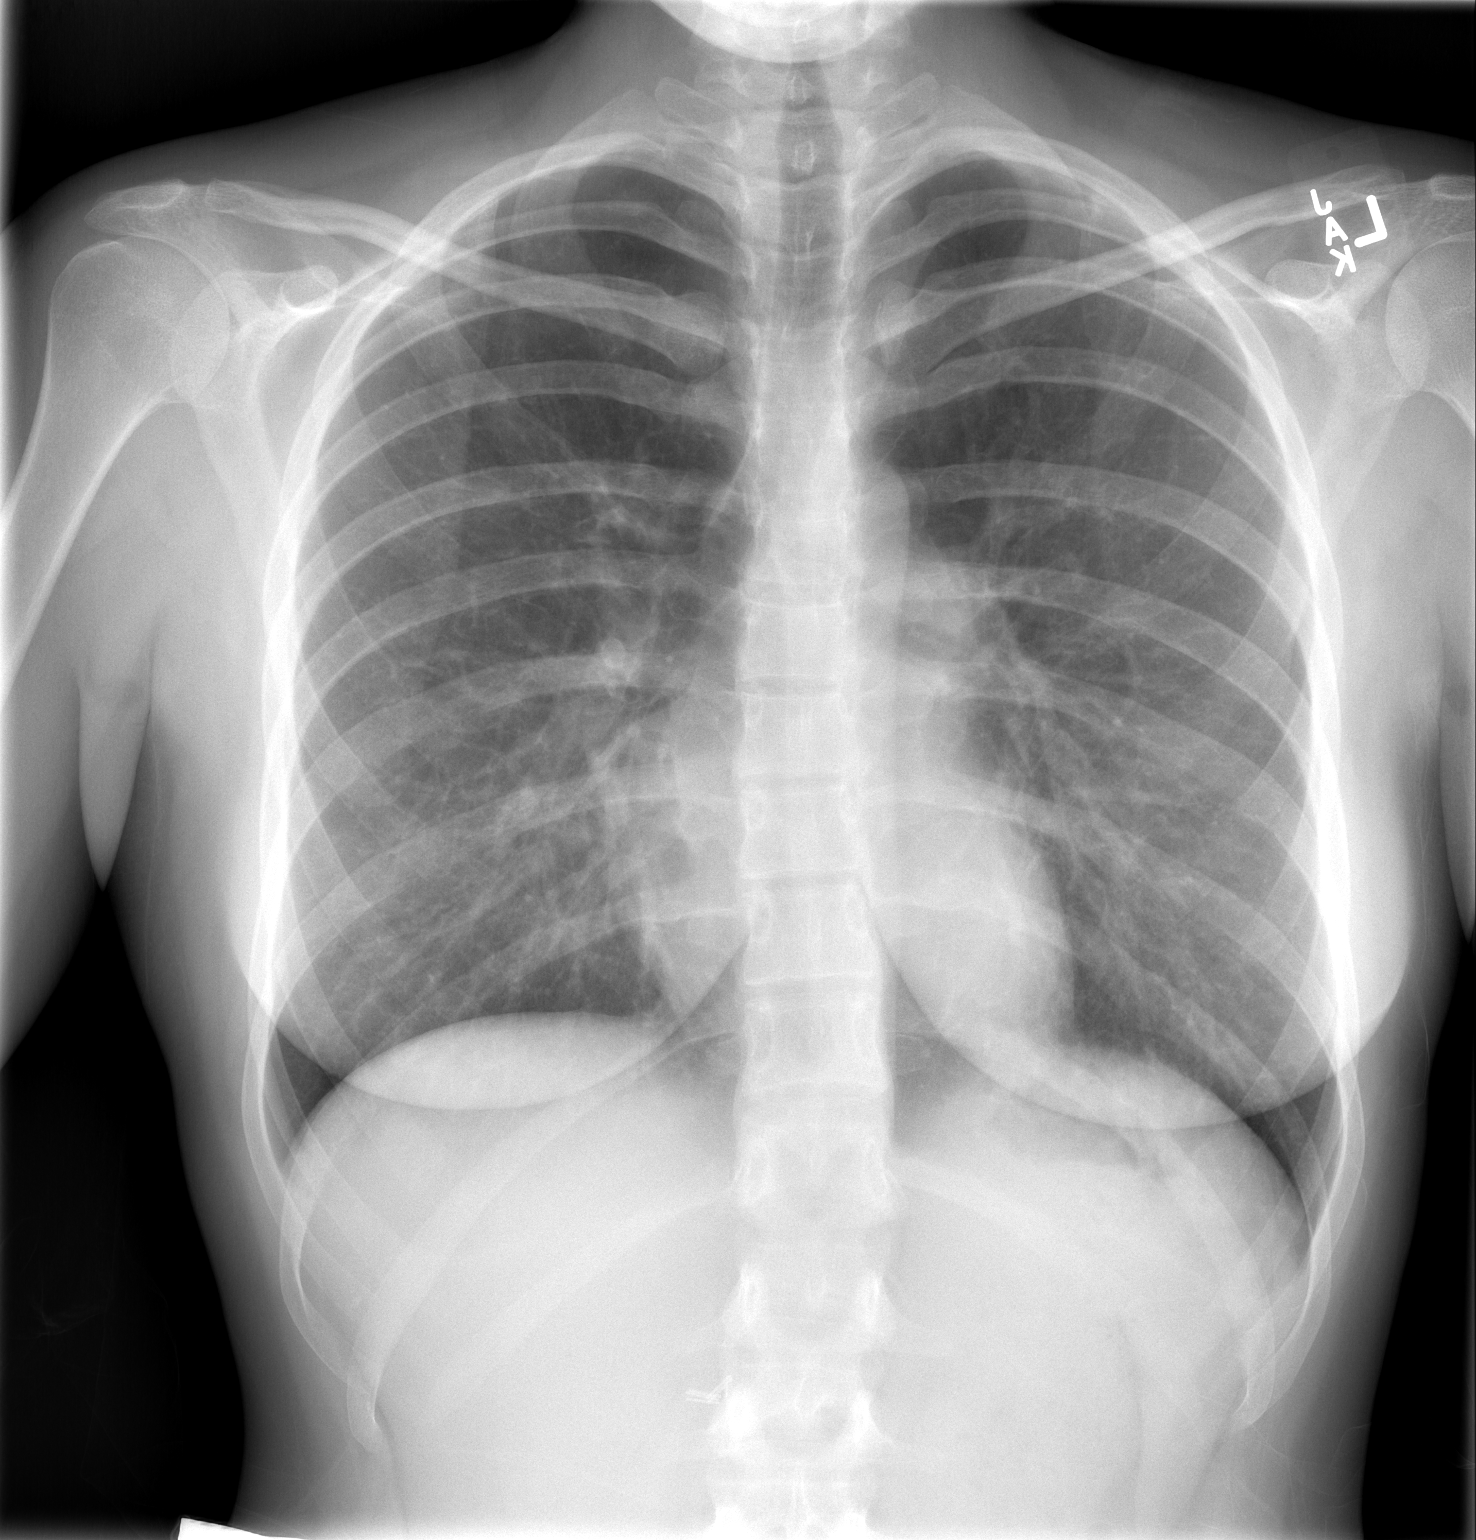

[w chest lat]
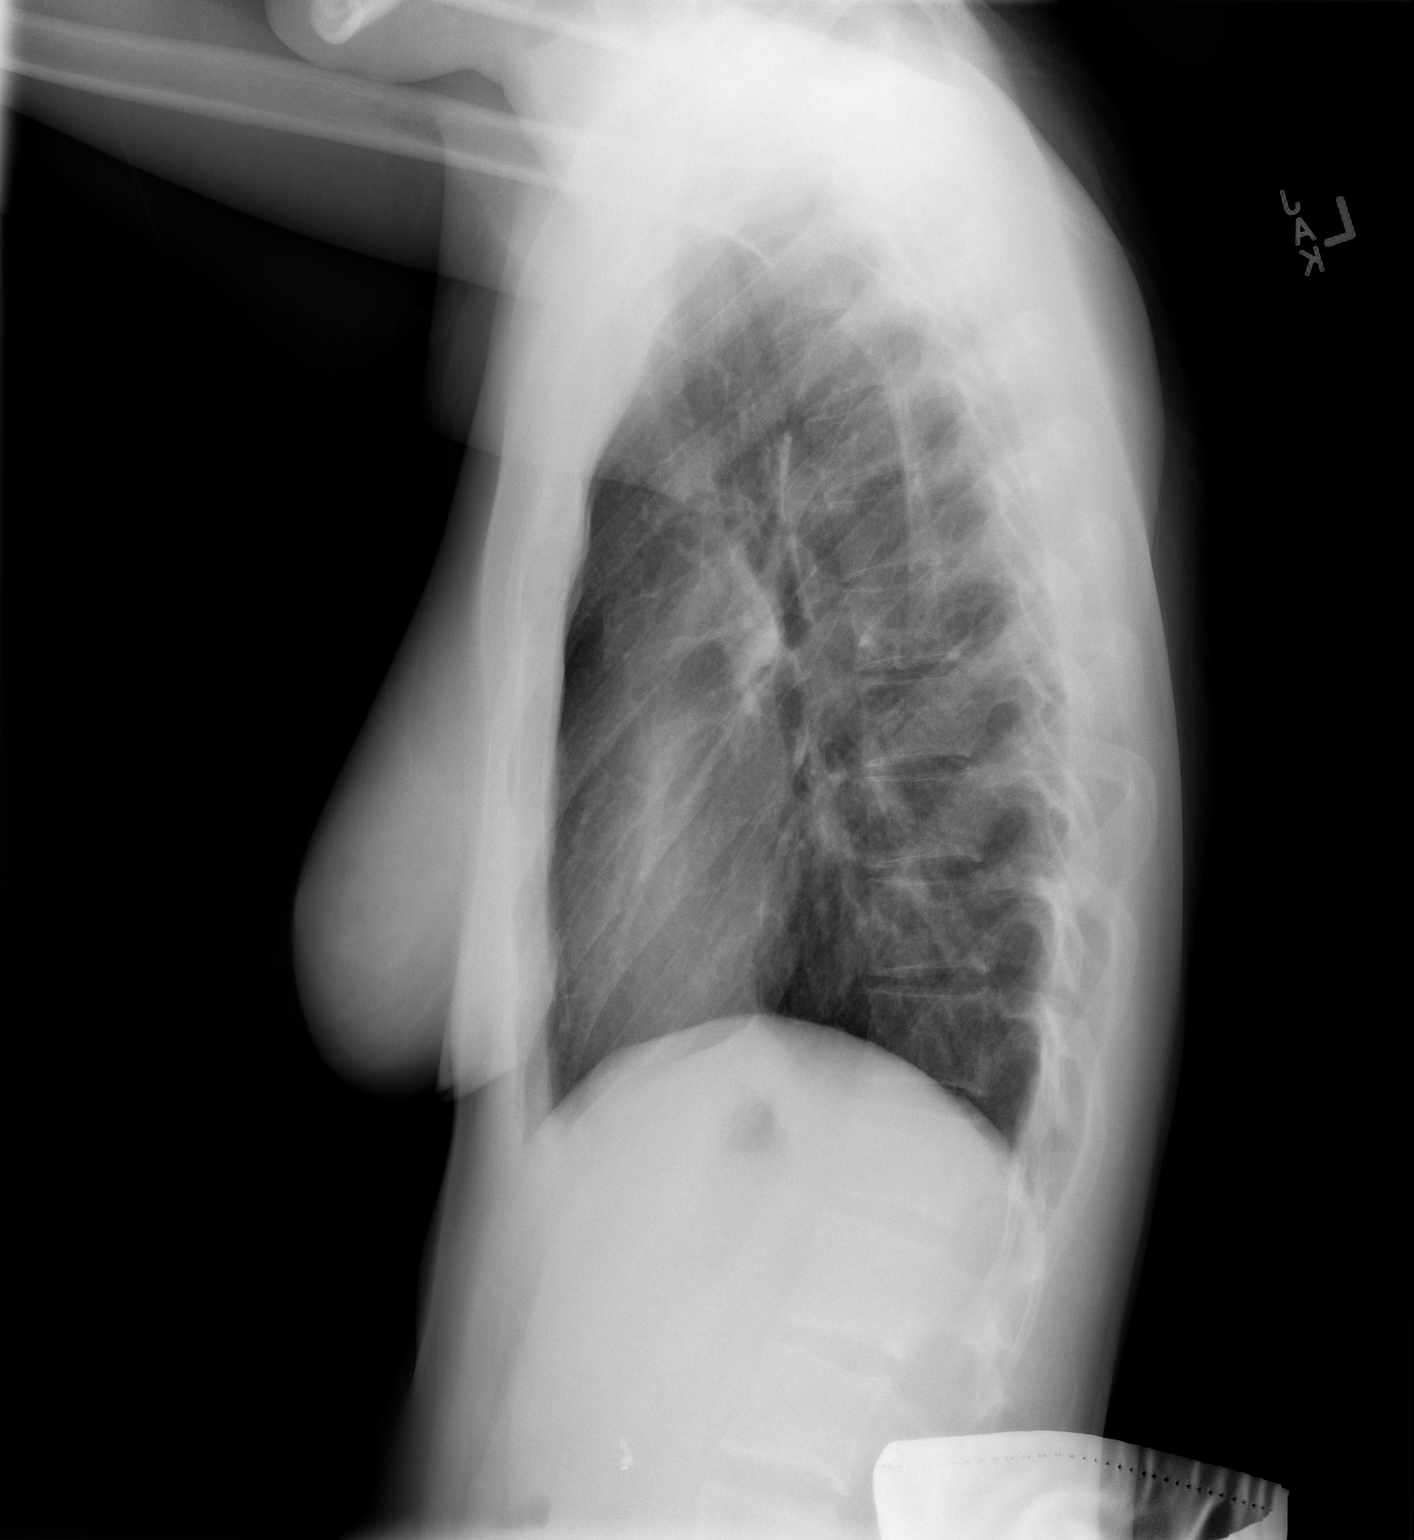

[2 of 2 positions shown; findings below may reference images not displayed]

FINDINGS: There is no edema or consolidation. The heart size and pulmonary
vascularity are normal. No adenopathy. No pneumothorax. No bone
lesions.
IMPRESSION: No edema or consolidation.

## 2017-02-06 IMAGING — DX DG RIBS 2V*L*
2 series · 2 of 2 positions shown · non-contrast
Comparison: Chest radiograph March 08, 2016

CLINICAL DATA: Pain for 3 weeks

EXAM:
LEFT RIBS - 2 VIEW

[rib pa]
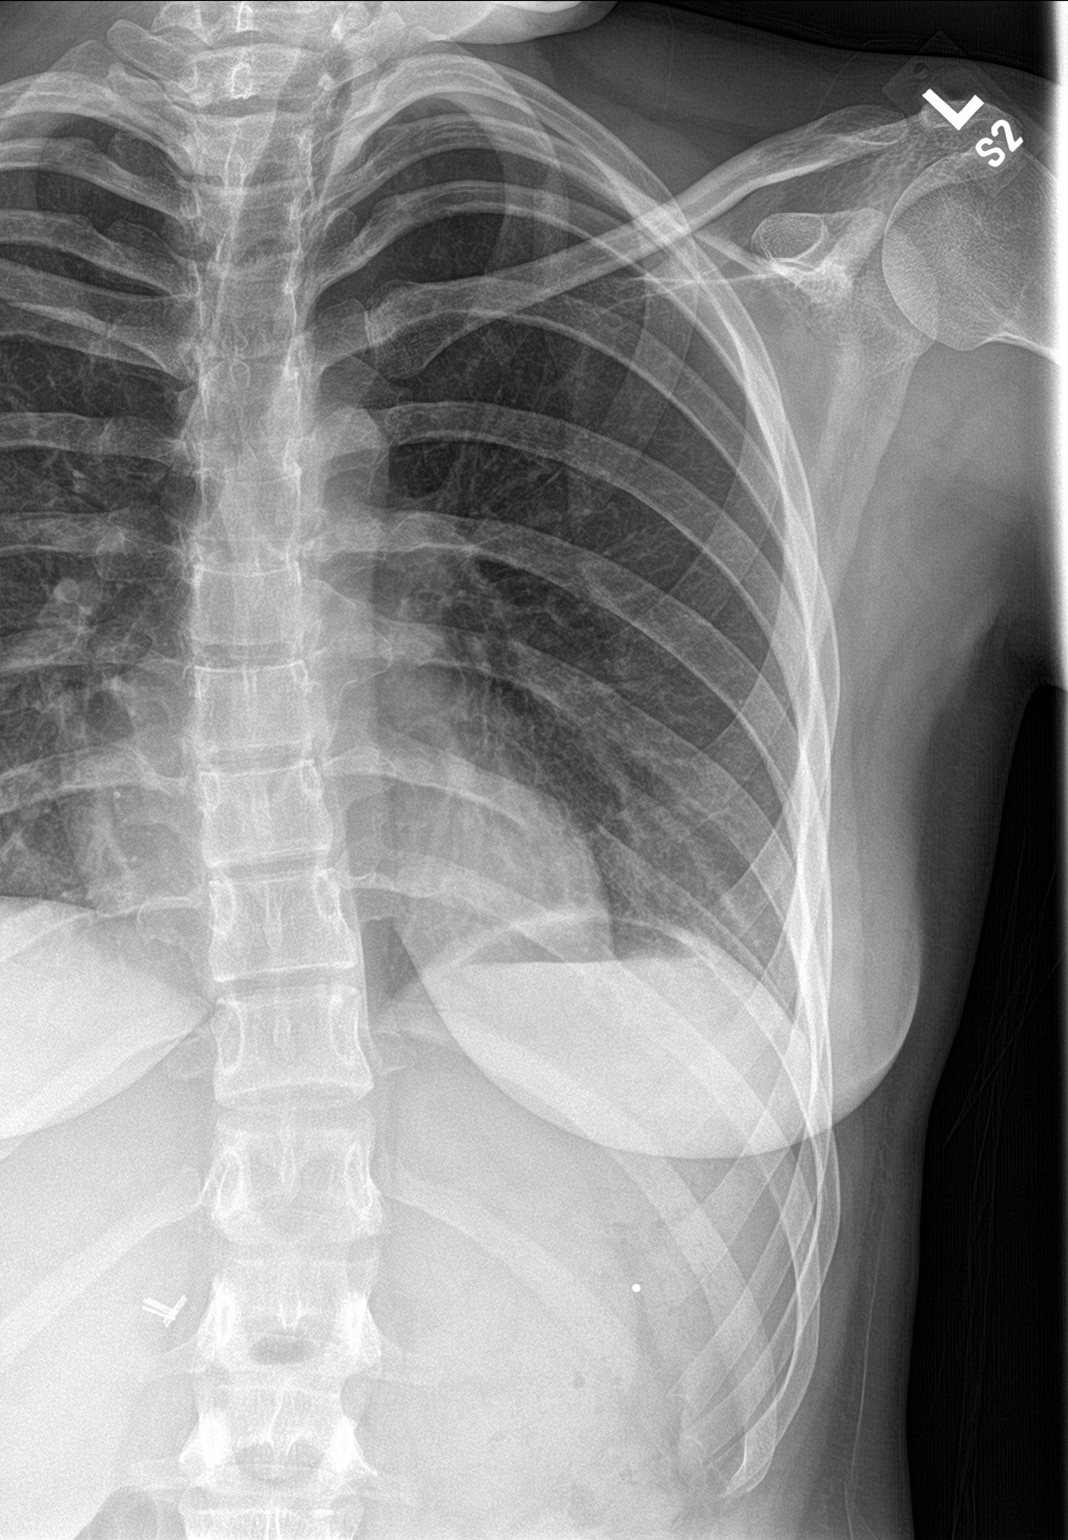

[rib obl]
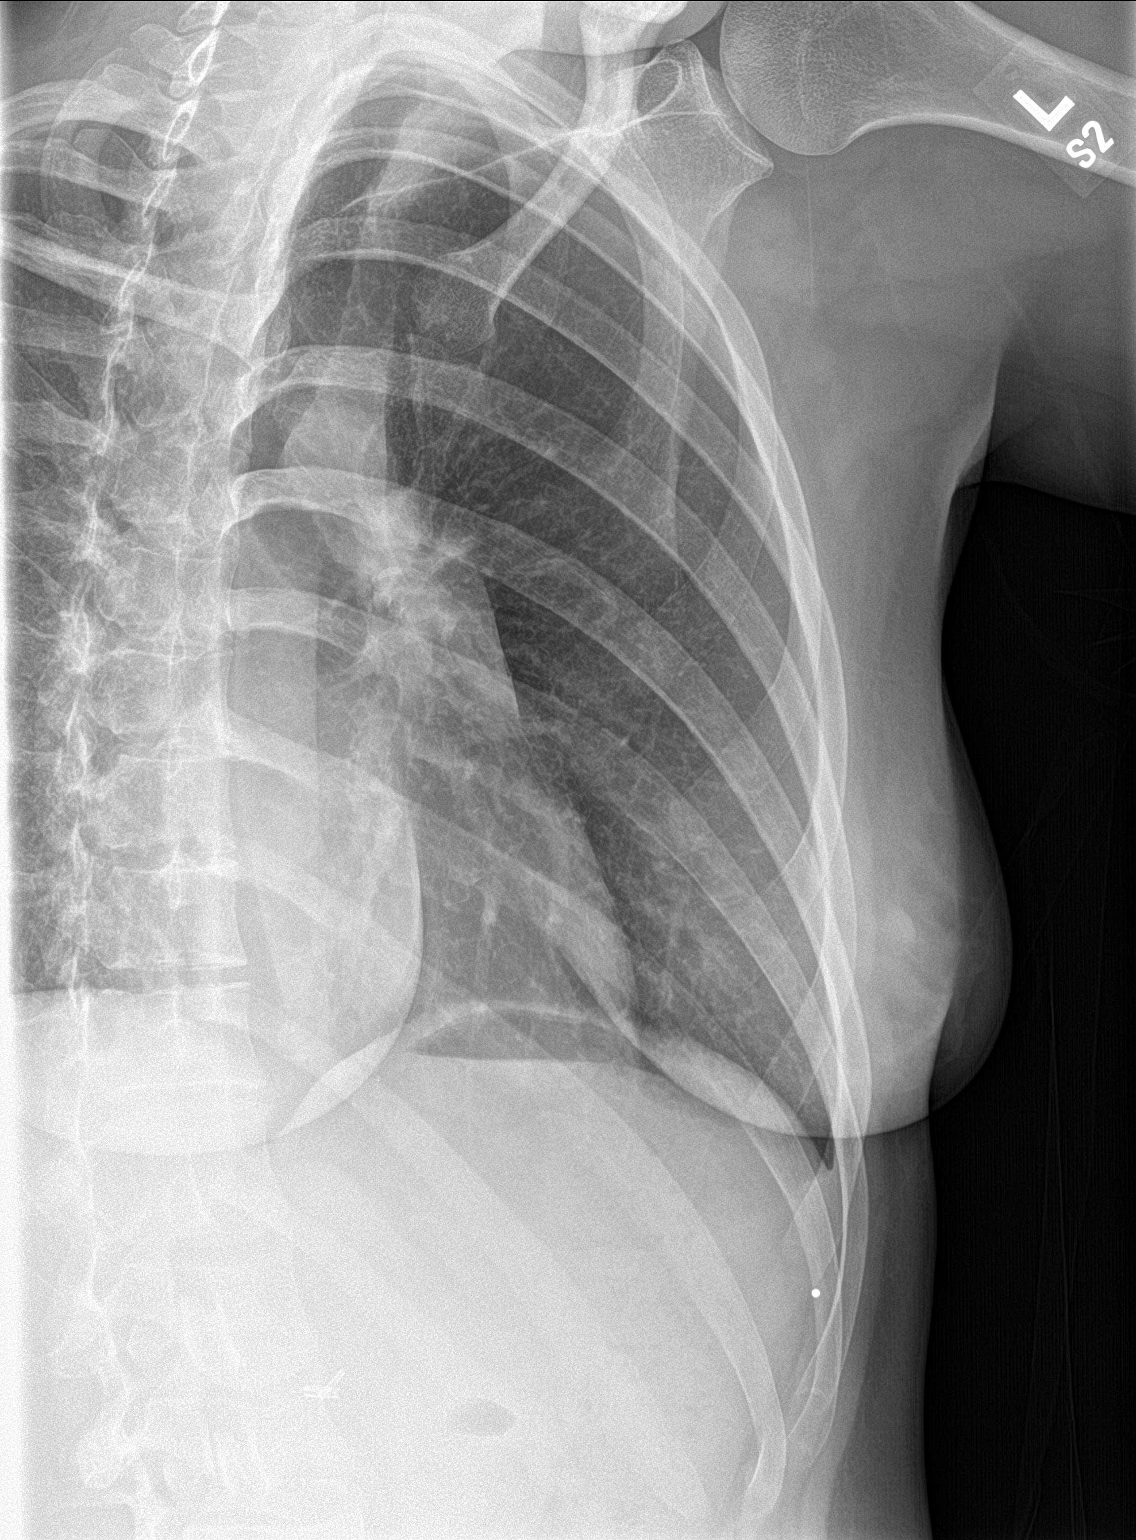

[2 of 2 positions shown; findings below may reference images not displayed]

FINDINGS: Frontal and oblique images obtained. Left lung is clear. No
pneumothorax or pleural effusion. Cardiac silhouette normal. No rib
fracture evident.
IMPRESSION: No demonstrable rib fracture.  Left lung clear.  No pneumothorax.

## 2017-02-20 ENCOUNTER — Encounter (HOSPITAL_COMMUNITY): Payer: Self-pay | Admitting: Psychiatry

## 2017-02-20 ENCOUNTER — Ambulatory Visit (INDEPENDENT_AMBULATORY_CARE_PROVIDER_SITE_OTHER): Admitting: Psychiatry

## 2017-02-20 DIAGNOSIS — F419 Anxiety disorder, unspecified: Secondary | ICD-10-CM | POA: Diagnosis not present

## 2017-02-20 DIAGNOSIS — F900 Attention-deficit hyperactivity disorder, predominantly inattentive type: Secondary | ICD-10-CM

## 2017-02-20 DIAGNOSIS — F331 Major depressive disorder, recurrent, moderate: Secondary | ICD-10-CM | POA: Diagnosis not present

## 2017-02-20 MED ORDER — VENLAFAXINE HCL ER 75 MG PO CP24: 75.0000 mg | ORAL_CAPSULE | Freq: Every day | ORAL | 0 refills | Status: DC

## 2017-02-20 MED ORDER — METHYLPHENIDATE HCL ER (OSM) 18 MG PO TBCR: 18.0000 mg | EXTENDED_RELEASE_TABLET | Freq: Every day | ORAL | 0 refills | Status: DC

## 2017-02-20 NOTE — Progress Notes (Signed)
[facility] MD/PA/NP OP Progress Note  02/20/2017 4:29 PM Angela [name]  MRN:  [id number]  Chief Complaint:  I am doing better on Effexor.  HPI: Angela [name] came for her follow-up appointment.  She is taking Effexor 75 mg and it is helping her anxiety and depression.  She is also taking Concerta 18 mg it is helping her focus, attention and multitasking.  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  Overall she described her headaches are much better since she is taking the Lamictal.  She is taking 125 mg but hoping to get 200 slowly and gradually.  Patient denies any crying spells, irritability, hopelessness or worthlessness.  She is no longer taking melatonin or Ambien.  She lives with her parents.  She has been busy and not able to see a therapist.  Patient denies any suicidal thoughts or homicidal thought.  She denies any tremors shakes or any EPS.  Her relationship with the boyfriend is going very well.  Patient denies drinking alcohol or using any illegal substances.  She is hoping to graduate next May.  Her appetite is okay.  Her vital signs are stable.  Visit Diagnosis:    ICD-10-CM   1. Major depressive disorder, recurrent episode, moderate (HCC) F33.1 venlafaxine XR (EFFEXOR-XR) 75 MG 24 hr capsule  2. Attention deficit hyperactivity disorder (ADHD), predominantly inattentive type F90.0 methylphenidate (CONCERTA) 18 MG PO CR tablet    Past Psychiatric History: Reviewed. Patient has history of ADD, anxiety and depression started school age. She has psychological testing done by psychologist Mickey dew. She has taken Remeron, amitriptyline, Focalin, clonidine in the past with limited response.  recently she tried Lexapro but are some time it stopped working.  Patient denies any history of suicidal attempt or any psychiatric inpatient treatment.  Past Medical History:  Past Medical History:  Diagnosis Date  . Acne   . 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 Chronic constipation   . Closed head injury 2012  . Cluster headaches   . Complication of anesthesia    wakes up combative  . Delayed gastric emptying   . GERD (gastroesophageal reflux disease)   . Heart murmur   . Muscle spasms of neck    receives PT; needs to have neck support during surgery, per mother  . Pulmonary artery stenosis    left    Past Surgical History:  Procedure Laterality Date  . [place] STUDY N/A 07/26/2015   Procedure: [place] STUDY;  Surgeon: Manus Gunning, MD;  Location: [facility];  Service: Gastroenterology;  Laterality: N/A;  . CHOLECYSTECTOMY N/A 12/09/2013   Procedure: LAPAROSCOPIC CHOLECYSTECTOMY WITH INTRAOPERATIVE CHOLANGIOGRAM;  Surgeon: Gwenyth Ober, MD;  Location: [place];  Service: General;  Laterality: N/A;  . ESOPHAGEAL MANOMETRY N/A 07/26/2015   Procedure: ESOPHAGEAL MANOMETRY (EM);  Surgeon: Manus Gunning, MD;  Location: [facility];  Service: Gastroenterology;  Laterality: N/A;  . ESOPHAGOGASTRODUODENOSCOPY (EGD) WITH PROPOFOL  07/26/2012  . HYMENECTOMY  12/21/2006  . LAPAROSCOPIC APPENDECTOMY N/A 09/28/2013   Procedure: APPENDECTOMY LAPAROSCOPIC;  Surgeon: Gwenyth Ober, MD;  Location: [place];  Service: General;  Laterality: N/A;  . LAPAROSCOPIC CHOLECYSTECTOMY  12/09/2013  . NASAL SEPTUM SURGERY  2009   x 2  . ROBOTIC ASSISTED LAPAROSCOPIC OVARIAN CYSTECTOMY Right 01/20/2011  . TONSILLECTOMY AND ADENOIDECTOMY  1999    Family Psychiatric History: Reviewed.  Family History:  Family History  Problem Relation Age of Onset  . Colon cancer Maternal Grandmother   . Other Maternal Grandfather        pituitary tumor  . Diabetes Paternal Grandmother   . Heart Problems Paternal Grandfather        A fib, CHF    Social History:  Social History   Social History  . Marital status: Single    Spouse name: N/A  . Number of children: 0  . Years of education: Sophmore   Occupational History   . student     [employer]   Social History Main Topics  . Smoking status: Never Smoker  . Smokeless tobacco: Never Used  . Alcohol use No  . Drug use: No  . Sexual activity: No   Other Topics Concern  . Not on file   Social History Narrative   Pt lives at home with family.   Caffeine Use: very little    Allergies:  Allergies  Allergen Reactions  . Cyanoacrylate Rash    Component of dermabond  . Clavulanic Acid Nausea And Vomiting    Pt can have penicillins  . Compazine [Prochlorperazine Edisylate] Other (See Comments)    COMBATIVE AND BELLIGERENT  . Ketamine Rash and Other (See Comments)    HALLUCINATIONS AND COMBATIVE  . Prednisone Other (See Comments)    Allergy tests confirmed affects skin and mood.   . Topamax [Topiramate] Hives and Rash    COMBATIVE  . Haldol [Haloperidol] Other (See Comments)    Becomes combative and beligerent  . Adhesive [Tape] Rash    BLISTERS  . Flagyl [Metronidazole] Rash  . Other Rash    Monocryl    Metabolic Disorder Labs: No results found for: HGBA1C, MPG No results found for: PROLACTIN No results found for: CHOL, TRIG, HDL, CHOLHDL, VLDL, LDLCALC No results found for: TSH  Therapeutic Level Labs: No results found for: LITHIUM No results found for: VALPROATE No components found for:  CBMZ  Current Medications: Current Outpatient Prescriptions  Medication Sig Dispense Refill  . cetirizine (ZYRTEC) 10 MG tablet Take 10 mg by mouth at bedtime.     Marland Kitchen DEXILANT 60 MG capsule TAKE 1 CAPSULE DAILY 90 capsule 3  . EPINEPHrine (EPIPEN 2-PAK) 0.3 mg/0.3 mL IJ SOAJ injection Inject 0.3 mg into the muscle as needed.    . hydrocortisone 2.5 % cream APPLY TOPICALLY TWICE A DAY 28 g 0  . hyoscyamine (LEVBID) 0.375 MG 12 hr tablet Take 1 tablet twice daily as needed for abdominal pain, cramping. 60 tablet 1  . ibuprofen (ADVIL,MOTRIN) 400 MG tablet Take 400 mg by mouth Every 8 hours as needed for pain.    . indomethacin (INDOCIN) 25 MG capsule  take 1 to 2 capsules WITH AMERGE FOR SEVERE HEADACHE every 12 hours if needed  0  . ketorolac (TORADOL) 60 MG/2ML SOLN injection     . magnesium oxide (MAG-OX) 400 MG tablet Take 800 mg by mouth daily.    . memantine (NAMENDA) 10 MG tablet Take 10 mg by mouth 2 (two) times daily.  0  . methylphenidate (CONCERTA) 18 MG PO CR tablet Take 1 tablet (18 mg total) by mouth daily. 90 tablet 0  . Naproxen Sodium 220 MG CAPS Take 220 mg by mouth.    . naratriptan (AMERGE) 2.5 MG tablet 2.5 mg at onset of headache, may repeat in 4 hours if needed    . ondansetron (ZOFRAN) 4 MG tablet Take 1 tablet (4 mg total) by mouth every 6 (six)  hours. 12 tablet 0  . ranitidine (ZANTAC) 150 MG tablet Take one or two po daily prn breakthrough GERD 60 tablet 3  . tiZANidine (ZANAFLEX) 4 MG capsule 4 mg.    . Topiramate ER 100 MG CP24 Take 50 mg by mouth at bedtime.    Marland Kitchen venlafaxine XR (EFFEXOR-XR) 75 MG 24 hr capsule Take 1 capsule (75 mg total) by mouth daily with breakfast. 90 capsule 0  . XIIDRA 5 % SOLN   1   No current facility-administered medications for this visit.      Musculoskeletal: Strength & Muscle Tone: within normal limits Gait & Station: normal Patient leans: N/A  Psychiatric Specialty Exam: ROS  Blood pressure 108/74, pulse 80, resp. rate 16, weight 157 lb 12.8 oz (71.6 kg).Body mass index is 24.71 kg/m.  General Appearance: Casual  Eye Contact:  Good  Speech:  Clear and Coherent  Volume:  Normal  Mood:  Euthymic  Affect:  Appropriate  Thought Process:  Goal Directed  Orientation:  Full (Time, Place, and Person)  Thought Content: Logical   Suicidal Thoughts:  No  Homicidal Thoughts:  No  Memory:  Immediate;   Good Recent;   Good Remote;   Good  Judgement:  Good  Insight:  Good  Psychomotor Activity:  Normal  Concentration:  Concentration: Good and Attention Span: Good  Recall:  Good  Fund of Knowledge: Good  Language: Good  Akathisia:  No  Handed:  Right  AIMS (if  indicated): not done  Assets:  Communication Skills Desire for Improvement Housing Resilience Social Support  ADL's:  Intact  Cognition: WNL  Sleep:  Good   Screenings:   Assessment and Plan: Major depressive disorder, recurrent.  Anxiety disorder NOS.  Attention deficit disorder inattentive type.  Patient doing better on her current medication.  Continue Effexor 75 mg daily, Concerta 18 mg once a day.  Recommended to call us back if she has any question, concern or if she feels worsening of the symptom.  Patient is not interested in counseling due to busy schedule.  Follow-up in 3 months.   Yaire Kreher T., MD 02/20/2017, 4:29 PM

## 2017-05-09 ENCOUNTER — Ambulatory Visit (HOSPITAL_COMMUNITY): Payer: Self-pay | Admitting: Psychiatry

## 2017-05-15 ENCOUNTER — Ambulatory Visit (HOSPITAL_COMMUNITY): Admitting: Psychiatry

## 2017-05-20 ENCOUNTER — Other Ambulatory Visit: Payer: Self-pay | Admitting: Gastroenterology

## 2017-05-24 ENCOUNTER — Ambulatory Visit: Admitting: Allergy

## 2017-06-01 ENCOUNTER — Encounter: Payer: Self-pay | Admitting: Allergy

## 2017-06-01 ENCOUNTER — Ambulatory Visit (INDEPENDENT_AMBULATORY_CARE_PROVIDER_SITE_OTHER): Admitting: Allergy

## 2017-06-01 VITALS — BP 124/70 | HR 97 | Resp 18 | Ht 67.5 in | Wt 159.8 lb

## 2017-06-01 DIAGNOSIS — H1010 Acute atopic conjunctivitis, unspecified eye: Secondary | ICD-10-CM

## 2017-06-01 DIAGNOSIS — T781XXD Other adverse food reactions, not elsewhere classified, subsequent encounter: Secondary | ICD-10-CM | POA: Diagnosis not present

## 2017-06-01 DIAGNOSIS — J309 Allergic rhinitis, unspecified: Secondary | ICD-10-CM

## 2017-06-01 DIAGNOSIS — T7819XD Other adverse food reactions, not elsewhere classified, subsequent encounter: Secondary | ICD-10-CM

## 2017-06-01 MED ORDER — EPINEPHRINE 0.3 MG/0.3ML IJ SOAJ: 0.3000 mg | INTRAMUSCULAR | 1 refills | Status: AC | PRN

## 2017-06-01 MED ORDER — LEVOCETIRIZINE DIHYDROCHLORIDE 5 MG PO TABS: 5.0000 mg | ORAL_TABLET | Freq: Every evening | ORAL | 5 refills | Status: DC

## 2017-06-01 NOTE — Progress Notes (Signed)
Follow-up Note  RE: Angela [name] MRN: [id number] DOB: 01-01-1996 Date of Office Visit: 06/01/2017    History of present illness: Angela [name] is a 22 y.o. female presenting today for follow-up of allergic rhinitis.  She presents today with her mother.  She was last seen in the office on 12/30/14 by Dr. Ishmael Holter.  She returns today as she would like to restart allergen immunotherapy.  She reports symptoms of itchy eyes, scratchy throat and nasal itch.  Symptoms are year-round.  She also reports wheezing around cats and more recently got a rabbit that also caused her to have wheezing and she has given the rabbit back and no longer has the rabbit.  She denies any wheezing outside of these exposures.  She reports she has had an albuterol inhaler in the past and reports was provided when she has a PNA.  Mother does not believe she ever used this inhaler.  She has no history of asthma.   She report being more symptomatic in her parents home vs where she lives.  For her allergy symptoms she takes zyrtec daily that she has been on for at least the past 10 years.  She has tried flonase in the past but did not find it to be helpful.   She has a long-standing history of GI symptoms and has followed with GI in the past.  She also has a history of migraines.  She has been seen in [facility] who mother reports they performed a food allergy panel and was shown to be "allergic" to milk, egg, oysters, almond, grains, casein, gluten, whey.  Mother called the office and the testing was done via IgG levels.   She does feel overall better with avoidance of these foods as she feels she has more energy and less GI issues but she still reports having migraines albeit they are milder.  She denies having any cutaneous, respiratory or CV related symptoms with food ingestion.     She has had previous sensitivity on skin testing (done in 2016) to [place] and johnson grass, ragweed, weed mix, tree mix.   In 2015 testing  done was positive to dust mites, cats,  tree pollens and cockroach.    Review of systems: Review of Systems  Constitutional: Negative for chills, fever and malaise/fatigue.  HENT: Positive for congestion. Negative for ear discharge, ear pain, nosebleeds, sinus pain, sore throat and tinnitus.   Eyes: Negative for pain, discharge and redness.  Respiratory: Negative for cough, sputum production, shortness of breath and wheezing.   Cardiovascular: Negative for chest pain.  Gastrointestinal: Negative for abdominal pain, constipation, diarrhea, heartburn, nausea and vomiting.  Musculoskeletal: Negative for joint pain.  Skin: Negative for itching and rash.  Neurological: Positive for headaches. Negative for dizziness.    All other systems negative unless noted above in HPI  Past medical/social/surgical/family history have been reviewed and are unchanged unless specifically indicated below: none  Medication List: Allergies as of 06/01/2017      Reactions   Cyanoacrylate Rash   Component of dermabond   Clavulanic Acid Nausea And Vomiting   Pt can have penicillins   Compazine [prochlorperazine Edisylate] Other (See Comments)   COMBATIVE AND BELLIGERENT   Imipramine Rash   Ketamine Rash, Other (See Comments)   HALLUCINATIONS AND COMBATIVE   Prednisone Other (See Comments)   Allergy tests confirmed affects skin and mood.    Topamax [topiramate] Hives, Rash   COMBATIVE   Haldol [haloperidol] Other (See Comments)  Becomes combative and beligerent   Adhesive [tape] Rash   BLISTERS   Flagyl [metronidazole] Rash   Other Rash   Monocryl      Medication List        Accurate as of 06/01/17  3:37 PM. Always use your most recent med list.          cetirizine 10 MG tablet Commonly known as:  ZYRTEC Take 10 mg by mouth at bedtime.   DEXILANT 60 MG capsule Generic drug:  dexlansoprazole TAKE 1 CAPSULE DAILY   EPIPEN 2-PAK 0.3 mg/0.3 mL Soaj injection Generic drug:   EPINEPHrine Inject 0.3 mg into the muscle as needed.   lamoTRIgine 25 MG tablet Commonly known as:  LAMICTAL 125 mg.   venlafaxine XR 75 MG 24 hr capsule Commonly known as:  EFFEXOR-XR Take 1 capsule (75 mg total) by mouth daily with breakfast.       Known medication allergies: Allergies  Allergen Reactions  . Cyanoacrylate Rash    Component of dermabond  . Clavulanic Acid Nausea And Vomiting    Pt can have penicillins  . Compazine [Prochlorperazine Edisylate] Other (See Comments)    COMBATIVE AND BELLIGERENT  . Imipramine Rash  . Ketamine Rash and Other (See Comments)    HALLUCINATIONS AND COMBATIVE  . Prednisone Other (See Comments)    Allergy tests confirmed affects skin and mood.   . Topamax [Topiramate] Hives and Rash    COMBATIVE  . Haldol [Haloperidol] Other (See Comments)    Becomes combative and beligerent  . Adhesive [Tape] Rash    BLISTERS  . Flagyl [Metronidazole] Rash  . Other Rash    Monocryl     Physical examination: Blood pressure 124/70, pulse 97, resp. rate 18, height 5' 7.5" (1.715 m), weight 159 lb 12.8 oz (72.5 kg), SpO2 97 %.  General: Alert, interactive, in no acute distress. HEENT: PERRLA, TMs pearly gray, turbinates mildly edematous with clear discharge, post-pharynx non erythematous. Neck: Supple without lymphadenopathy. Lungs: Clear to auscultation without wheezing, rhonchi or rales. {no increased work of breathing. CV: Normal S1, S2 without murmurs. Abdomen: Nondistended, nontender. Skin: Warm and dry, without lesions or rashes. Extremities:  No clubbing, cyanosis or edema. Neuro:   Grossly intact.  Diagnositics/Labs:  Allergy testing: unable to perform due to recent antihistamine use   Assessment and plan:   Allergic rhinoconjunctivitis    - will obtain environmental panel to use to supplement previous skin testing from 2016.     - will plan to restart allergy immunotherapy (allergy shots) based on lab results.  Angela [name] is aware  of protocol, benefits and risk of allergy shots.     - will prescribe epipen to bring on days of your allergy shots.    - trial Xyzal 5mg  daily (this replaces Zyrtec)    - trial Dymista nasal spray 1 spray each nostril twice a day.  This is a combination nasal spray with Flonase + Astelin (nasal antihistamine).  Patanase is also a nasal antihistamine that can be used to replace Astelin if you have this spray already.  Flonase and nasal steroid sprays help with nasal congestion and nasal antihistamines help with nasal drainage.     Adverse food reaction   - discussed with pt today the difference between IgG and IgE levels and that IgG levels tell us there has been a past exposure but does not indicate food allergy.  Positive IgE along with history consistent with reaction would indicate food allergy.  I recommended that we  check for IgE to these foods she is avoiding to determine if she needs to have strict avoidance or not.     - will obtain IgE levels to the foods you are currently avoiding to determine if you have IgE mediated food allergy.  If levels are positive would recommend avoidance and having access to Epipen and follow emergency action plan provided in case of reaction.   Follow-up 6 months or sooner if needed  I appreciate the opportunity to take part in [facility] care. Please do not hesitate to contact me with questions.  Sincerely,   Prudy Feeler, MD Allergy/Immunology [facility] [place] [facility] [place]

## 2017-06-01 NOTE — Patient Instructions (Addendum)
Allergic rhinoconjunctivitis    - will obtain environmental panel to use to supplement previous skin testing from 2016.     - will plan to restart allergy immunotherapy (allergy shots) based on lab results.  Kieanna is aware of protocol, benefits and risk of allergy shots.     - will prescribe epipen to bring on days of your allergy shots.    - trial Xyzal 5mg  daily (this replaces Zyrtec)    - trial Dymista nasal spray 1 spray each nostril twice a day.  This is a combination nasal spray with Flonase + Astelin (nasal antihistamine).  Patanase is also a nasal antihistamine that can be used to replace Astelin if you have this spray already.  Flonase and nasal steroid sprays help with nasal congestion and nasal antihistamines help with nasal drainage.     Adverse food reaction   - will obtain IgE levels to the foods you are currently avoiding to determine if you have IgE mediated food allergy.  If levels are positive would recommend avoidance and having access to Epipen and follow emergency action plan provided in case of reaction.   Follow-up 6 months or sooner if needed

## 2017-06-03 LAB — IGE+ALLERGENS ZONE 2(30)
Alternaria Alternata IgE: <0.1 kU/L
Amer Sycamore IgE Qn: <0.1 kU/L
Bahia Grass IgE: <0.1 kU/L
Bermuda Grass IgE: <0.1 kU/L
Cedar, Mountain IgE: 0.37 kU/L — AB
Cladosporium Herbarum IgE: <0.1 kU/L
Cockroach, American IgE: <0.1 kU/L
D Farinae IgE: 15.4 kU/L — AB
D Pteronyssinus IgE: 18.9 kU/L — AB
E001-IGE CAT DANDER: 0.69 kU/L — AB
E005-IGE DOG DANDER: 0.12 kU/L — AB
Elm, American IgE: <0.1 kU/L
Hickory, White IgE: <0.1 kU/L
IgE (Immunoglobulin E), Serum: 123 IU/mL — ABNORMAL HIGH (ref 0–100)
Maple/Box Elder IgE: <0.1 kU/L
Mucor Racemosus IgE: <0.1 kU/L
Nettle IgE: <0.1 kU/L
Oak, White IgE: <0.1 kU/L
Penicillium Chrysogen IgE: <0.1 kU/L
Pigweed, Rough IgE: <0.1 kU/L
Plantain, English IgE: <0.1 kU/L
Ragweed, Short IgE: <0.1 kU/L
Sweet gum IgE RAST Ql: <0.1 kU/L
Timothy Grass IgE: <0.1 kU/L

## 2017-06-03 LAB — EGG COMPONENT PANEL: F233-IgE Ovomucoid: <0.1 kU/L

## 2017-06-03 LAB — ALLERGEN PROFILE, FOOD-GRAIN
Allergen Barley IgE: <0.1 kU/L
Allergen Oat IgE: <0.1 kU/L
Allergen Rice IgE: <0.1 kU/L

## 2017-06-03 LAB — MILK COMPONENT PANEL
F076-IgE Alpha Lactalbumin: <0.1 kU/L
F077-IgE Beta Lactoglobulin: <0.1 kU/L
F078-IgE Casein: 0.15 kU/L — AB

## 2017-06-03 LAB — ALLERGEN, OYSTER, F290: F290-IgE Oyster: <0.1 kU/L

## 2017-06-03 LAB — ALLERGEN, WHEY, F236: Whey: 0.15 kU/L — AB

## 2017-06-03 LAB — F020-IGE ALMOND

## 2017-06-03 LAB — E082-IGE RABBIT EPITHELIA

## 2017-06-07 ENCOUNTER — Telehealth: Payer: Self-pay | Admitting: Allergy

## 2017-06-07 NOTE — Telephone Encounter (Signed)
Pt called about her labs [phone].

## 2017-06-08 ENCOUNTER — Telehealth: Payer: Self-pay

## 2017-06-08 NOTE — Telephone Encounter (Signed)
Patient wants to start injections. Her appointment is on 07/03/17.

## 2017-06-12 ENCOUNTER — Encounter (HOSPITAL_COMMUNITY): Payer: Self-pay | Admitting: Psychiatry

## 2017-06-12 ENCOUNTER — Ambulatory Visit (INDEPENDENT_AMBULATORY_CARE_PROVIDER_SITE_OTHER): Admitting: Psychiatry

## 2017-06-12 VITALS — BP 102/68 | HR 90 | Ht 67.0 in | Wt 158.0 lb

## 2017-06-12 DIAGNOSIS — F900 Attention-deficit hyperactivity disorder, predominantly inattentive type: Secondary | ICD-10-CM | POA: Diagnosis not present

## 2017-06-12 DIAGNOSIS — F419 Anxiety disorder, unspecified: Secondary | ICD-10-CM

## 2017-06-12 DIAGNOSIS — G4700 Insomnia, unspecified: Secondary | ICD-10-CM

## 2017-06-12 DIAGNOSIS — F331 Major depressive disorder, recurrent, moderate: Secondary | ICD-10-CM

## 2017-06-12 DIAGNOSIS — Z79899 Other long term (current) drug therapy: Secondary | ICD-10-CM

## 2017-06-12 MED ORDER — VENLAFAXINE HCL ER 150 MG PO CP24: 150.0000 mg | ORAL_CAPSULE | Freq: Every day | ORAL | 0 refills | Status: DC

## 2017-06-12 MED ORDER — METHYLPHENIDATE HCL ER (OSM) 18 MG PO TBCR: 18.0000 mg | EXTENDED_RELEASE_TABLET | Freq: Every day | ORAL | 0 refills | Status: DC

## 2017-06-12 MED ORDER — TRAZODONE HCL 50 MG PO TABS: 50.0000 mg | ORAL_TABLET | Freq: Every day | ORAL | 0 refills | Status: DC

## 2017-06-12 NOTE — Progress Notes (Signed)
[facility] MD/PA/NP OP Progress Note  06/12/2017 8:52 AM Angela [name]  MRN:  [id number]  Chief Complaint: I am feeling depressed and sad.  I do not think my medicine working.  HPI: Angela [name] came for her follow-up appointment.  She is noticed increased anxiety, nervousness, lack of energy, fatigue and social isolation.  She is now living with her roommates for past few months.  She like living care.  She recently broke up with her boyfriend who wanted some time and extra space.  Patient told he moved to [place].  She admitted having crying spells, anhedonia and sometimes hopelessness.  She endorsed racing thoughts and poor sleep.  She feels very anxious and nervous.  However she denies any suicidal thoughts or homicidal thought.  She is trying to spend more time with her mother.  Due to the winter season her job hours are significantly cut down.  She is only going 3-4 times a month.  She is going to start school tomorrow.  She will need Concerta.  She has been out of Concerta for the past 2 months due to school holidays.  She is a Charity fundraiser at [employer].  She saw a neurologist and her headaches are getting better.  She is taking Lamictal 200 mg from neurologist and a new medication which she believes working very well.  Patient denies drinking alcohol or using any illegal substances.  Her appetite is okay.  She denies any paranoia, hallucination or any mania.  Visit Diagnosis:    ICD-10-CM   1. Major depressive disorder, recurrent episode, moderate (HCC) F33.1 venlafaxine XR (EFFEXOR-XR) 150 MG 24 hr capsule    traZODone (DESYREL) 50 MG tablet  2. Attention deficit hyperactivity disorder (ADHD), predominantly inattentive type F90.0 methylphenidate (CONCERTA) 18 MG PO CR tablet    Past Psychiatric History: Reviewed. Patient has history of ADD, anxiety and depression started school age. She has psychological testing done by psychologist Mickey dew. She has taken Remeron, amitriptyline, Focalin,  clonidine in the past with limited response. recently she tried Lexapro but are some time it stopped working. Patient denies any history of suicidal attempt or any psychiatric inpatient treatment.  Past Medical History:  Past Medical History:  Diagnosis Date  . Acne   . Biliary dyskinesia 11/2013   symptomatic  . Chronic constipation   . Closed head injury 2012  . Cluster headaches   . Complication of anesthesia    wakes up combative  . Delayed gastric emptying   . GERD (gastroesophageal reflux disease)   . Heart murmur   . Muscle spasms of neck    receives PT; needs to have neck support during surgery, per mother  . Pulmonary artery stenosis    left    Past Surgical History:  Procedure Laterality Date  . [place] STUDY N/A 07/26/2015   Procedure: [place] STUDY;  Surgeon: Manus Gunning, MD;  Location: [facility];  Service: Gastroenterology;  Laterality: N/A;  . CHOLECYSTECTOMY N/A 12/09/2013   Procedure: LAPAROSCOPIC CHOLECYSTECTOMY WITH INTRAOPERATIVE CHOLANGIOGRAM;  Surgeon: Gwenyth Ober, MD;  Location: [facility];  Service: General;  Laterality: N/A;  . ESOPHAGEAL MANOMETRY N/A 07/26/2015   Procedure: ESOPHAGEAL MANOMETRY (EM);  Surgeon: Manus Gunning, MD;  Location: [facility];  Service: Gastroenterology;  Laterality: N/A;  . ESOPHAGOGASTRODUODENOSCOPY (EGD) WITH PROPOFOL  07/26/2012  . HYMENECTOMY  12/21/2006  . LAPAROSCOPIC APPENDECTOMY N/A 09/28/2013   Procedure: APPENDECTOMY LAPAROSCOPIC;  Surgeon: Gwenyth Ober, MD;  Location: [facility];  Service: General;  Laterality: N/A;  . LAPAROSCOPIC CHOLECYSTECTOMY  12/09/2013  . NASAL SEPTUM SURGERY  2009   x 2  . ROBOTIC ASSISTED LAPAROSCOPIC OVARIAN CYSTECTOMY Right 01/20/2011  . TONSILLECTOMY AND ADENOIDECTOMY  1999    Family Psychiatric History: Reviewed.  Family History:  Family History  Problem Relation Age of Onset  . Colon cancer Maternal Grandmother   . Other Maternal Grandfather        pituitary  tumor  . Diabetes Paternal Grandmother   . Heart Problems Paternal Grandfather        A fib, CHF    Social History:  Social History   Socioeconomic History  . Marital status: Single    Spouse name: None  . Number of children: 0  . Years of education: [employer]  . Highest education level: None  Social Needs  . Financial resource strain: None  . Food insecurity - worry: None  . Food insecurity - inability: None  . Transportation needs - medical: None  . Transportation needs - non-medical: None  Occupational History  . Occupation: student    Comment: [employer]  Tobacco Use  . Smoking status: Never Smoker  . Smokeless tobacco: Never Used  Substance and Sexual Activity  . Alcohol use: No  . Drug use: No  . Sexual activity: No    Birth control/protection: None  Other Topics Concern  . None  Social History Narrative   Pt lives at home with family.   Caffeine Use: very little    Allergies:  Allergies  Allergen Reactions  . Cyanoacrylate Rash    Component of dermabond  . Clavulanic Acid Nausea And Vomiting    Pt can have penicillins  . Compazine [Prochlorperazine Edisylate] Other (See Comments)    COMBATIVE AND BELLIGERENT  . Imipramine Rash  . Ketamine Rash and Other (See Comments)    HALLUCINATIONS AND COMBATIVE  . Prednisone Other (See Comments)    Allergy tests confirmed affects skin and mood.   . Topamax [Topiramate] Hives and Rash    COMBATIVE  . Haldol [Haloperidol] Other (See Comments)    Becomes combative and beligerent  . Adhesive [Tape] Rash    BLISTERS  . Flagyl [Metronidazole] Rash  . Other Rash    Monocryl    Metabolic Disorder Labs: No results found for: HGBA1C, MPG No results found for: PROLACTIN No results found for: CHOL, TRIG, HDL, CHOLHDL, VLDL, LDLCALC No results found for: TSH  Therapeutic Level Labs: No results found for: LITHIUM No results found for: VALPROATE No components found for:  CBMZ  Current Medications: Current Outpatient  Medications  Medication Sig Dispense Refill  . DEXILANT 60 MG capsule TAKE 1 CAPSULE DAILY 90 capsule 3  . EPINEPHrine (EPIPEN 2-PAK) 0.3 mg/0.3 mL IJ SOAJ injection Inject 0.3 mLs (0.3 mg total) into the muscle as needed. 2 Device 1  . lamoTRIgine (LAMICTAL) 25 MG tablet 125 mg.    . levocetirizine (XYZAL) 5 MG tablet Take 1 tablet (5 mg total) by mouth every evening. 30 tablet 5  . venlafaxine XR (EFFEXOR-XR) 75 MG 24 hr capsule Take 1 capsule (75 mg total) by mouth daily with breakfast. 90 capsule 0  . cetirizine (ZYRTEC) 10 MG tablet Take 10 mg by mouth at bedtime.      No current facility-administered medications for this visit.      Musculoskeletal: Strength & Muscle Tone: within normal limits Gait & Station: normal Patient leans: N/A  Psychiatric Specialty Exam: Review of Systems  Constitutional: Negative.  HENT: Negative.   Respiratory: Negative.   Cardiovascular: Negative.   Genitourinary: Negative.   Musculoskeletal: Negative.   Skin: Negative.   Psychiatric/Behavioral: Positive for depression. The patient is nervous/anxious and has insomnia.     Blood pressure 102/68, pulse 90, height 5\' 7"  (1.702 m), weight 158 lb (71.7 kg), SpO2 98 %.Body mass index is 24.75 kg/m.  General Appearance: Casual  Eye Contact:  Fair  Speech:  Slow  Volume:  Decreased  Mood:  Depressed and Dysphoric  Affect:  Constricted and Depressed  Thought Process:  Goal Directed  Orientation:  Full (Time, Place, and Person)  Thought Content: Rumination   Suicidal Thoughts:  No  Homicidal Thoughts:  No  Memory:  Immediate;   Good Recent;   Good Remote;   Good  Judgement:  Good  Insight:  Good  Psychomotor Activity:  Decreased  Concentration:  Concentration: Fair and Attention Span: Fair  Recall:  Good  Fund of Knowledge: Good  Language: Good  Akathisia:  No  Handed:  Right  AIMS (if indicated): not done  Assets:  Communication Skills Desire for  [employer]/Skills Transportation  ADL's:  Intact  Cognition: WNL  Sleep:  Fair   Screenings:   Assessment and Plan: Major depressive disorder, recurrent.  Anxiety disorder NOS.  Attention deficit disorder, inattentive type.  Patient is experiencing increased depression and anxiety symptoms.  Discussed psychosocial stressors including recent breakup.  Recommended to try Effexor 150 mg daily.  We will start Concerta 18 mg once a day to help ADD symptoms.  Encouraged to see a therapist for counseling.  Patient agreed and she will call to schedule appointment.  I would also order gene testing for better efficacy of antianxiety and antidepressant.  Discussed side effects of medication.  If patient do not see improvement with the higher Effexor we will consider switching the medication.  I would also add low-dose trazodone to help insomnia.  In the past she had tried Ambien and Klonopin with limited response.  I reviewed records from other providers.  She is now taking the medication for migraine headaches which is working.  Follow-up in 4 weeks.  Time spent 25 minutes.  More than 50% of the time spent in psychoeducation, counseling and coordination of care.  Kathlee Nations, MD 06/12/2017, 8:52 AM

## 2017-06-14 NOTE — Addendum Note (Signed)
Addended by: Theresia Lo on: 06/14/2017 08:46 AM   Modules accepted: Orders

## 2017-06-14 NOTE — Progress Notes (Signed)
VIALS EXP 06-15-18

## 2017-06-15 DIAGNOSIS — J3089 Other allergic rhinitis: Secondary | ICD-10-CM | POA: Diagnosis not present

## 2017-07-03 ENCOUNTER — Ambulatory Visit (INDEPENDENT_AMBULATORY_CARE_PROVIDER_SITE_OTHER): Admitting: *Deleted

## 2017-07-03 DIAGNOSIS — J309 Allergic rhinitis, unspecified: Secondary | ICD-10-CM | POA: Diagnosis not present

## 2017-07-05 NOTE — Progress Notes (Signed)
Immunotherapy   Patient Details  Name: Angela [name] MRN: [id number] Date of Birth: 1996-04-01  07/02/2017  Angela [name] started injections for  [place]-Cat  Following schedule: B  Frequency:2 times per week Epi-Pen:Epi-Pen Available  Consent signed and patient instructions given. No problem after 30 minutes in the office.    Horris Latino 07/02/2017, 5:36 PM

## 2017-07-13 ENCOUNTER — Ambulatory Visit (INDEPENDENT_AMBULATORY_CARE_PROVIDER_SITE_OTHER): Admitting: *Deleted

## 2017-07-13 DIAGNOSIS — J309 Allergic rhinitis, unspecified: Secondary | ICD-10-CM

## 2017-07-16 ENCOUNTER — Other Ambulatory Visit (HOSPITAL_COMMUNITY): Payer: Self-pay

## 2017-07-16 DIAGNOSIS — F331 Major depressive disorder, recurrent, moderate: Secondary | ICD-10-CM

## 2017-07-16 MED ORDER — VENLAFAXINE HCL ER 150 MG PO CP24: 150.0000 mg | ORAL_CAPSULE | Freq: Every day | ORAL | 0 refills | Status: DC

## 2017-07-20 ENCOUNTER — Ambulatory Visit (INDEPENDENT_AMBULATORY_CARE_PROVIDER_SITE_OTHER)

## 2017-07-20 DIAGNOSIS — J309 Allergic rhinitis, unspecified: Secondary | ICD-10-CM | POA: Diagnosis not present

## 2017-07-23 ENCOUNTER — Ambulatory Visit (INDEPENDENT_AMBULATORY_CARE_PROVIDER_SITE_OTHER): Admitting: Psychiatry

## 2017-07-23 ENCOUNTER — Encounter (HOSPITAL_COMMUNITY): Payer: Self-pay | Admitting: Psychiatry

## 2017-07-23 DIAGNOSIS — F331 Major depressive disorder, recurrent, moderate: Secondary | ICD-10-CM | POA: Diagnosis not present

## 2017-07-23 DIAGNOSIS — Z79899 Other long term (current) drug therapy: Secondary | ICD-10-CM

## 2017-07-23 DIAGNOSIS — R51 Headache: Secondary | ICD-10-CM | POA: Diagnosis not present

## 2017-07-23 DIAGNOSIS — F900 Attention-deficit hyperactivity disorder, predominantly inattentive type: Secondary | ICD-10-CM

## 2017-07-23 DIAGNOSIS — F419 Anxiety disorder, unspecified: Secondary | ICD-10-CM

## 2017-07-23 MED ORDER — VENLAFAXINE HCL ER 37.5 MG PO CP24: ORAL_CAPSULE | ORAL | 0 refills | Status: DC

## 2017-07-23 MED ORDER — VILAZODONE HCL 10 MG PO TABS: ORAL_TABLET | ORAL | 0 refills | Status: DC

## 2017-07-23 MED ORDER — METHYLPHENIDATE HCL ER (OSM) 36 MG PO TBCR: 36.0000 mg | EXTENDED_RELEASE_TABLET | Freq: Every day | ORAL | 0 refills | Status: DC

## 2017-07-23 MED ORDER — TRAZODONE HCL 50 MG PO TABS: 50.0000 mg | ORAL_TABLET | Freq: Every day | ORAL | 0 refills | Status: DC

## 2017-07-23 NOTE — Progress Notes (Signed)
[facility] MD/PA/NP OP Progress Note  07/23/2017 8:15 AM Angela [name]  MRN:  [id number]  Chief Complaint: I still feel tired, I do not think my medicine working.  HPI: Angela [name] came for her follow-up appointment.  On her last visit we started her low-dose Concerta to help her attention, focus and ADD symptoms.  She is taking 80 mg Concerta but do not see a huge improvement.  She still feel tired, having crying spells, feeling hopelessness and worthlessness.  She is sleeping on and off.  She is very anxious and nervous.  Patient denies any suicidal thoughts or homicidal thought but endorsed social isolation, withdrawn with lack of energy.  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  She is only going 3-4 times a month.  Her headaches are stable.  She is taking Lamictal from neurologist for the headaches.  Patient denies drinking alcohol or using any illegal substances.  Her energy level is fair.  Patient has gene testing and results shows Lexapro, amitriptyline, Wellbutrin, Viibryd, Pristiq and good selection.  Others are in the middle section which includes venlafaxine.  Patient denies drinking alcohol or using any illegal substances.  Patient doing with a roommate since she broke up with her friend.  Visit Diagnosis:    ICD-10-CM   1. Attention deficit hyperactivity disorder (ADHD), predominantly inattentive type F90.0 methylphenidate 36 MG PO CR tablet  2. Major depressive disorder, recurrent episode, moderate (HCC) F33.1 venlafaxine XR (EFFEXOR-XR) 37.5 MG 24 hr capsule    Vilazodone HCl (VIIBRYD) 10 MG TABS    traZODone (DESYREL) 50 MG tablet    Past Psychiatric History: Reviewed. Patient has history of ADD, anxiety, major depression since school age.  She has psychological testing done by psychologist Greggory Brandy.  She had tried Remeron, amitriptyline, Focalin, clonidine, Lexapro and recently Effexor.  Patient  denies any history of suicidal attempt or any psychiatric inpatient treatment.  Past Medical History:  Past Medical History:  Diagnosis Date  . Acne   . Biliary dyskinesia 11/2013   symptomatic  . Chronic constipation   . Closed head injury 2012  . Cluster headaches   . Complication of anesthesia    wakes up combative  . Delayed gastric emptying   . GERD (gastroesophageal reflux disease)   . Heart murmur   . Muscle spasms of neck    receives PT; needs to have neck support during surgery, per mother  . Pulmonary artery stenosis    left    Past Surgical History:  Procedure Laterality Date  . [place] STUDY N/A 07/26/2015   Procedure: [place] STUDY;  Surgeon: Manus Gunning, MD;  [facility]: [facility];  Service: Gastroenterology;  Laterality: N/A;  . CHOLECYSTECTOMY N/A 12/09/2013   Procedure: LAPAROSCOPIC CHOLECYSTECTOMY WITH INTRAOPERATIVE CHOLANGIOGRAM;  Surgeon: Gwenyth Ober, MD;  Location: [facility];  Service: General;  Laterality: N/A;  . ESOPHAGEAL MANOMETRY N/A 07/26/2015   Procedure: ESOPHAGEAL MANOMETRY (EM);  Surgeon: Manus Gunning, MD;  [facility];  Service: Gastroenterology;  Laterality: N/A;  . ESOPHAGOGASTRODUODENOSCOPY (EGD) WITH PROPOFOL  07/26/2012  . HYMENECTOMY  12/21/2006  . LAPAROSCOPIC APPENDECTOMY N/A 09/28/2013   Procedure: APPENDECTOMY LAPAROSCOPIC;  Surgeon: Gwenyth Ober, MD;  Location: [facility];  Service: General;  Laterality: N/A;  . LAPAROSCOPIC CHOLECYSTECTOMY  12/09/2013  . NASAL SEPTUM SURGERY  2009   x 2  .  ROBOTIC ASSISTED LAPAROSCOPIC OVARIAN CYSTECTOMY Right 01/20/2011  . TONSILLECTOMY AND ADENOIDECTOMY  1999    Family Psychiatric History: Reviewed.  Family History:  Family History  Problem Relation Age of Onset  . Colon cancer Maternal Grandmother   . Other Maternal Grandfather        pituitary tumor  . Diabetes Paternal Grandmother   . Heart Problems Paternal Grandfather        A fib, CHF    Social History:   Social History   Socioeconomic History  . Marital status: Single    Spouse name: Not on file  . Number of children: 0  . Years of education: [employer]  . Highest education level: Not on file  Social Needs  . Financial resource strain: Not on file  . Food insecurity - worry: Not on file  . Food insecurity - inability: Not on file  . Transportation needs - medical: Not on file  . Transportation needs - non-medical: Not on file  Occupational History  . Occupation: student    Comment: [employer]  Tobacco Use  . Smoking status: Never Smoker  . Smokeless tobacco: Never Used  Substance and Sexual Activity  . Alcohol use: No  . Drug use: No  . Sexual activity: No    Birth control/protection: None  Other Topics Concern  . Not on file  Social History Narrative   Pt lives at home with family.   Caffeine Use: very little    Allergies:  Allergies  Allergen Reactions  . Cyanoacrylate Rash    Component of dermabond  . Clavulanic Acid Nausea And Vomiting    Pt can have penicillins  . Compazine [Prochlorperazine Edisylate] Other (See Comments)    COMBATIVE AND BELLIGERENT  . Imipramine Rash  . Ketamine Rash and Other (See Comments)    HALLUCINATIONS AND COMBATIVE  . Prednisone Other (See Comments)    Allergy tests confirmed affects skin and mood.   . Topamax [Topiramate] Hives and Rash    COMBATIVE  . Haldol [Haloperidol] Other (See Comments)    Becomes combative and beligerent  . Adhesive [Tape] Rash    BLISTERS  . Flagyl [Metronidazole] Rash  . Other Rash    Monocryl    Metabolic Disorder Labs: No results found for: HGBA1C, MPG No results found for: PROLACTIN No results found for: CHOL, TRIG, HDL, CHOLHDL, VLDL, LDLCALC No results found for: TSH  Therapeutic Level Labs: No results found for: LITHIUM No results found for: VALPROATE No components found for:  CBMZ  Current Medications: Current Outpatient Medications  Medication Sig Dispense Refill  . cetirizine  (ZYRTEC) 10 MG tablet Take 10 mg by mouth at bedtime.     Marland Kitchen DEXILANT 60 MG capsule TAKE 1 CAPSULE DAILY 90 capsule 3  . EPINEPHrine (EPIPEN 2-PAK) 0.3 mg/0.3 mL IJ SOAJ injection Inject 0.3 mLs (0.3 mg total) into the muscle as needed. 2 Device 1  . Erenumab-aooe (AIMOVIG) 70 MG/ML SOAJ Inject 70 mg into the skin every 30 (thirty) days.    Marland Kitchen lamoTRIgine (LAMICTAL) 200 MG tablet TK 1 T PO QD  6  . levocetirizine (XYZAL) 5 MG tablet Take 1 tablet (5 mg total) by mouth every evening. 30 tablet 5  . methylphenidate (CONCERTA) 18 MG PO CR tablet Take 1 tablet (18 mg total) by mouth daily. 30 tablet 0  . naratriptan (AMERGE) 2.5 MG tablet 2.5 mg at onset of headache, may repeat in 4 hours if needed    . traZODone (DESYREL) 50 MG tablet  Take 1 tablet (50 mg total) by mouth at bedtime. 30 tablet 0  . venlafaxine XR (EFFEXOR-XR) 150 MG 24 hr capsule Take 1 capsule (150 mg total) by mouth daily with breakfast. 30 capsule 0   No current facility-administered medications for this visit.      Musculoskeletal: Strength & Muscle Tone: within normal limits Gait & Station: normal Patient leans: N/A  Psychiatric Specialty Exam: Review of Systems  Constitutional: Negative.   Respiratory: Negative.   Gastrointestinal: Negative.   Musculoskeletal: Negative.   Skin: Negative.   Neurological: Negative for tremors.  Psychiatric/Behavioral: Positive for depression. The patient is nervous/anxious.     Blood pressure 107/80, pulse 82, height 5\' 7"  (1.702 m), weight 155 lb 6.4 oz (70.5 kg).There is no height or weight on file to calculate BMI.  General Appearance: Casual and shy  Eye Contact:  Fair  Speech:  Slow  Volume:  Decreased  Mood:  Anxious, Depressed and Dysphoric  Affect:  Constricted and Depressed  Thought Process:  Goal Directed  Orientation:  Full (Time, Place, and Person)  Thought Content: Logical   Suicidal Thoughts:  No  Homicidal Thoughts:  No  Memory:  Immediate;   Good Recent;    Good Remote;   Good  Judgement:  Good  Insight:  Good  Psychomotor Activity:  Decreased  Concentration:  Concentration: Fair and Attention Span: Fair  Recall:  Good  Fund of Knowledge: Good  Language: Good  Akathisia:  No  Handed:  Right  AIMS (if indicated): not done  Assets:  Communication Skills Desire for Improvement Housing Resilience  ADL's:  Intact  Cognition: WNL  Sleep:  Fair   Screenings:   Assessment and Plan: Depressive disorder, recurrent.  Anxiety disorder NOS.  Attention deficit disorder, inattentive type.  I reviewed gene testing results with the patient.  In the past she had tried Lexapro and amitriptyline with poor outcome.  I recommended to increase Concerta 36 mg to help her ADD symptoms.  We will try Viibryd 10 mg daily for 1 week and then 20 mg daily.  She will reduce Effexor 75 mg for 2 weeks and then 37.5 mg daily.  I will continue trazodone 50 mg at bedtime which is helping her sleep.  She has no tremors, shakes or any EPS.  We discussed serotonin syndrome due to antidepressant.  She is getting Lamictal from her neurologist for headaches.  I recommended to call us back if she has any question or any concern.  I will see her again in 4 weeks.  Time spent 25 minutes.   Kathlee Nations, MD 07/23/2017, 8:15 AM

## 2017-08-16 ENCOUNTER — Ambulatory Visit (INDEPENDENT_AMBULATORY_CARE_PROVIDER_SITE_OTHER): Admitting: Licensed Clinical Social Worker

## 2017-08-16 ENCOUNTER — Encounter (HOSPITAL_COMMUNITY): Payer: Self-pay | Admitting: Licensed Clinical Social Worker

## 2017-08-16 DIAGNOSIS — F331 Major depressive disorder, recurrent, moderate: Secondary | ICD-10-CM | POA: Diagnosis not present

## 2017-08-16 DIAGNOSIS — F900 Attention-deficit hyperactivity disorder, predominantly inattentive type: Secondary | ICD-10-CM | POA: Diagnosis not present
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
## 2017-08-18 ENCOUNTER — Other Ambulatory Visit (HOSPITAL_COMMUNITY): Payer: Self-pay | Admitting: Psychiatry

## 2017-08-18 DIAGNOSIS — F331 Major depressive disorder, recurrent, moderate: Secondary | ICD-10-CM

## 2017-08-21 ENCOUNTER — Ambulatory Visit (HOSPITAL_COMMUNITY): Admitting: Psychiatry

## 2017-09-06 ENCOUNTER — Encounter (HOSPITAL_COMMUNITY): Payer: Self-pay | Admitting: Psychiatry

## 2017-09-06 ENCOUNTER — Ambulatory Visit (INDEPENDENT_AMBULATORY_CARE_PROVIDER_SITE_OTHER): Admitting: Psychiatry

## 2017-09-06 VITALS — BP 106/78 | HR 90 | Ht 67.0 in | Wt 157.0 lb

## 2017-09-06 DIAGNOSIS — F900 Attention-deficit hyperactivity disorder, predominantly inattentive type: Secondary | ICD-10-CM | POA: Diagnosis not present

## 2017-09-06 DIAGNOSIS — F331 Major depressive disorder, recurrent, moderate: Secondary | ICD-10-CM

## 2017-09-06 DIAGNOSIS — F419 Anxiety disorder, unspecified: Secondary | ICD-10-CM | POA: Diagnosis not present

## 2017-09-06 MED ORDER — VILAZODONE HCL 20 MG PO TABS: 20.0000 mg | ORAL_TABLET | Freq: Every day | ORAL | 0 refills | Status: DC

## 2017-09-06 MED ORDER — METHYLPHENIDATE HCL ER (OSM) 36 MG PO TBCR: 36.0000 mg | EXTENDED_RELEASE_TABLET | Freq: Every day | ORAL | 0 refills | Status: DC

## 2017-09-06 MED ORDER — TRAZODONE HCL 50 MG PO TABS: 50.0000 mg | ORAL_TABLET | Freq: Every day | ORAL | 0 refills | Status: DC

## 2017-09-06 NOTE — Progress Notes (Signed)
[place] MD/PA/NP OP Progress Note  09/06/2017 3:27 PM Angela [name]  MRN:  [id number]  Chief Complaint: I am doing much better with the new medication.  HPI: Patient came for her follow-up appointment.  She is now taking Viibryd 20 mg daily and stop the Effexor.  She like increase Concerta to help her focus and attention.  She is more social and less withdrawn.  She is sleeping good.  She denies any crying spells and denies any feeling of hopelessness or worthlessness.  She noticed improvement with the new medication.  She has no agitation, anger, mania or any psychosis.  She also trying a new medication for headaches and so far she received 2 injections.  Patient denies drinking alcohol or using any illegal substances.  Her school is going well and she is excited because in May she is getting graduation and now she will look for a full-time job.  Patient graduated in Estate agent.  Patient able to do multitasking.  Her energy level is improved.  She has no tremors, shakes, palpitation or any insomnia.  She lives with a roommate since she broke up with a friend.  Visit Diagnosis:    ICD-10-CM   1. Major depressive disorder, recurrent episode, moderate (HCC) F33.1 Vilazodone HCl (VIIBRYD) 20 MG TABS    traZODone (DESYREL) 50 MG tablet  2. Attention deficit hyperactivity disorder (ADHD), predominantly inattentive type F90.0 methylphenidate 36 MG PO CR tablet    DISCONTINUED: methylphenidate 36 MG PO CR tablet    Past Psychiatric History: Reviewed Patient has history of ADD, anxiety, major depression since school age.  She has psychological testing done by psychologist Greggory Brandy.  She had tried Remeron, amitriptyline, Focalin, clonidine, Lexapro and Effexor.  We did gene testing and results shows Lexapro, amitriptyline, Wellbutrin, Viibryd, Pristiq and good selection. Patient denies any history of suicidal attempt or any psychiatric inpatient treatment.  Past Medical History:  Past Medical  History:  Diagnosis Date  . Acne   . Biliary dyskinesia 11/2013   symptomatic  . Chronic constipation   . Closed head injury 2012  . Cluster headaches   . Complication of anesthesia    wakes up combative  . Delayed gastric emptying   . GERD (gastroesophageal reflux disease)   . Heart murmur   . Muscle spasms of neck    receives PT; needs to have neck support during surgery, per mother  . Pulmonary artery stenosis    left    Past Surgical History:  Procedure Laterality Date  . [place] STUDY N/A 07/26/2015   Procedure: [place] STUDY;  Surgeon: Manus Gunning, MD;  Location: [facility];  Service: Gastroenterology;  Laterality: N/A;  . CHOLECYSTECTOMY N/A 12/09/2013   Procedure: LAPAROSCOPIC CHOLECYSTECTOMY WITH INTRAOPERATIVE CHOLANGIOGRAM;  Surgeon: Gwenyth Ober, MD;  Location: [facility];  Service: General;  Laterality: N/A;  . ESOPHAGEAL MANOMETRY N/A 07/26/2015   Procedure: ESOPHAGEAL MANOMETRY (EM);  Surgeon: Manus Gunning, MD;  Location: [facility];  Service: Gastroenterology;  Laterality: N/A;  . ESOPHAGOGASTRODUODENOSCOPY (EGD) WITH PROPOFOL  07/26/2012  . HYMENECTOMY  12/21/2006  . LAPAROSCOPIC APPENDECTOMY N/A 09/28/2013   Procedure: APPENDECTOMY LAPAROSCOPIC;  Surgeon: Gwenyth Ober, MD;  Location: [facility];  Service: General;  Laterality: N/A;  . LAPAROSCOPIC CHOLECYSTECTOMY  12/09/2013  . NASAL SEPTUM SURGERY  2009   x 2  . ROBOTIC ASSISTED LAPAROSCOPIC OVARIAN CYSTECTOMY Right 01/20/2011  . TONSILLECTOMY AND ADENOIDECTOMY  1999    Family Psychiatric History: Reviewed.  Family History:  Family History  Problem Relation Age of Onset  . Colon cancer Maternal Grandmother   . Other Maternal Grandfather        pituitary tumor  . Diabetes Paternal Grandmother   . Heart Problems Paternal Grandfather        A fib, CHF    Social History:  Social History   Socioeconomic History  . Marital status: Single    Spouse name: Not on file  . Number of  children: 0  . Years of education: [employer]  . Highest education level: Not on file  Occupational History  . Occupation: student    Comment: [employer]  Social Needs  . Financial resource strain: Not on file  . Food insecurity:    Worry: Not on file    Inability: Not on file  . Transportation needs:    Medical: Not on file    Non-medical: Not on file  Tobacco Use  . Smoking status: Never Smoker  . Smokeless tobacco: Never Used  Substance and Sexual Activity  . Alcohol use: No  . Drug use: No  . Sexual activity: Never    Birth control/protection: None  Lifestyle  . Physical activity:    Days per week: Not on file    Minutes per session: Not on file  . Stress: Not on file  Relationships  . Social connections:    Talks on phone: Not on file    Gets together: Not on file    Attends religious service: Not on file    Active member of club or organization: Not on file    Attends meetings of clubs or organizations: Not on file    Relationship status: Not on file  Other Topics Concern  . Not on file  Social History Narrative   Pt lives at home with family.   Caffeine Use: very little    Allergies:  Allergies  Allergen Reactions  . Cyanoacrylate Rash    Component of dermabond  . Clavulanic Acid Nausea And Vomiting    Pt can have penicillins  . Compazine [Prochlorperazine Edisylate] Other (See Comments)    COMBATIVE AND BELLIGERENT  . Imipramine Rash  . Ketamine Rash and Other (See Comments)    HALLUCINATIONS AND COMBATIVE  . Prednisone Other (See Comments)    Allergy tests confirmed affects skin and mood.   . Topamax [Topiramate] Hives and Rash    COMBATIVE  . Haldol [Haloperidol] Other (See Comments)    Becomes combative and beligerent  . Adhesive [Tape] Rash    BLISTERS  . Flagyl [Metronidazole] Rash  . Other Rash    Monocryl    Metabolic Disorder Labs: No results found for: HGBA1C, MPG No results found for: PROLACTIN No results found for: CHOL, TRIG, HDL,  CHOLHDL, VLDL, LDLCALC No results found for: TSH  Therapeutic Level Labs: No results found for: LITHIUM No results found for: VALPROATE No components found for:  CBMZ  Current Medications: Current Outpatient Medications  Medication Sig Dispense Refill  . cetirizine (ZYRTEC) 10 MG tablet Take 10 mg by mouth at bedtime.     Marland Kitchen DEXILANT 60 MG capsule TAKE 1 CAPSULE DAILY 90 capsule 3  . EPINEPHrine (EPIPEN 2-PAK) 0.3 mg/0.3 mL IJ SOAJ injection Inject 0.3 mLs (0.3 mg total) into the muscle as needed. 2 Device 1  . Erenumab-aooe (AIMOVIG) 70 MG/ML SOAJ Inject 70 mg into the skin every 30 (thirty) days.    Marland Kitchen lamoTRIgine (LAMICTAL) 200 MG tablet TK 1 T PO QD  6  . levocetirizine (XYZAL) 5 MG tablet Take 1 tablet (5 mg total) by mouth every evening. 30 tablet 5  . methylphenidate 36 MG PO CR tablet Take 1 tablet (36 mg total) by mouth daily. 30 tablet 0  . naratriptan (AMERGE) 2.5 MG tablet 2.5 mg at onset of headache, may repeat in 4 hours if needed    . traZODone (DESYREL) 50 MG tablet Take 1 tablet (50 mg total) by mouth at bedtime. 90 tablet 0  . venlafaxine XR (EFFEXOR-XR) 37.5 MG 24 hr capsule Take 2 capsule daily for 1 week and than 1 capsule daily 60 capsule 0  . Vilazodone HCl (VIIBRYD) 10 MG TABS Take 1 tab daily for 1 week and than 20 mg daily 32 tablet 0   No current facility-administered medications for this visit.      Musculoskeletal: Strength & Muscle Tone: within normal limits Gait & Station: normal Patient leans: N/A  Psychiatric Specialty Exam: ROS  Blood pressure 106/78, pulse 90, height 5\' 7"  (1.702 m), weight 157 lb (71.2 kg), SpO2 98 %.There is no height or weight on file to calculate BMI.  General Appearance: Casual  Eye Contact:  Good  Speech:  Clear and Coherent  Volume:  Normal  Mood:  Euthymic  Affect:  Appropriate  Thought Process:  Goal Directed  Orientation:  Full (Time, Place, and Person)  Thought Content: Logical   Suicidal Thoughts:  No   Homicidal Thoughts:  No  Memory:  Immediate;   Good Recent;   Good Remote;   Good  Judgement:  Good  Insight:  Good  Psychomotor Activity:  Normal  Concentration:  Concentration: Fair and Attention Span: Fair  Recall:  Good  Fund of Knowledge: Good  Language: Good  Akathisia:  No  Handed:  Right  AIMS (if indicated): not done  Assets:  Communication Skills Desire for Improvement Housing Resilience Social Support  ADL's:  Intact  Cognition: WNL  Sleep:  Good   Screenings:   Assessment and Plan: Major depressive disorder, recurrent.  Attention deficit disorder, inattentive type.  Anxiety disorder NOS.  Patient doing better since we increase the dose of Concerta.  She is more attentive and her attention concentration is improved.  She is able to do multitasking.  She is no longer taking Effexor.  Continue Viibryd 20 mg daily.  Continue trazodone 50 mg at bedtime.  She is getting Lamictal from her neurologist.  Recommended to call us back if she has any question, concern if she feels worsening of the symptoms.  Follow-up in 3 months.   Kathlee Nations, MD 09/06/2017, 3:27 PM

## 2017-09-10 ENCOUNTER — Ambulatory Visit (HOSPITAL_COMMUNITY): Payer: Self-pay | Admitting: Licensed Clinical Social Worker

## 2017-09-17 ENCOUNTER — Ambulatory Visit (HOSPITAL_COMMUNITY): Payer: Self-pay | Admitting: Licensed Clinical Social Worker

## 2017-09-24 ENCOUNTER — Ambulatory Visit (HOSPITAL_COMMUNITY): Payer: Self-pay | Admitting: Licensed Clinical Social Worker

## 2017-10-01 ENCOUNTER — Ambulatory Visit (HOSPITAL_COMMUNITY): Payer: Self-pay | Admitting: Licensed Clinical Social Worker

## 2017-11-15 ENCOUNTER — Encounter

## 2017-11-15 ENCOUNTER — Ambulatory Visit: Payer: Self-pay | Admitting: Gastroenterology

## 2017-11-19 ENCOUNTER — Ambulatory Visit (HOSPITAL_COMMUNITY): Payer: Self-pay | Admitting: Licensed Clinical Social Worker

## 2017-12-03 ENCOUNTER — Encounter (HOSPITAL_COMMUNITY): Payer: Self-pay | Admitting: Licensed Clinical Social Worker

## 2017-12-03 ENCOUNTER — Other Ambulatory Visit (HOSPITAL_COMMUNITY): Payer: Self-pay | Admitting: Psychiatry

## 2017-12-03 ENCOUNTER — Ambulatory Visit (INDEPENDENT_AMBULATORY_CARE_PROVIDER_SITE_OTHER): Admitting: Licensed Clinical Social Worker

## 2017-12-03 DIAGNOSIS — F331 Major depressive disorder, recurrent, moderate: Secondary | ICD-10-CM
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
## 2017-12-06 ENCOUNTER — Ambulatory Visit (HOSPITAL_COMMUNITY): Payer: Self-pay | Admitting: Psychiatry

## 2017-12-06 NOTE — Telephone Encounter (Signed)
One time refill of patient's Viibryd 20 mg tablet for 90 days e-scribed to patient's [facility] per Dr. Dwyane Dee approval after patient had to be rescheduled from 12/06/17 until 12/31/17 with Dr. Adele Schilder for provider out sick that date.

## 2017-12-12 DIAGNOSIS — M79671 Pain in right foot: Secondary | ICD-10-CM | POA: Insufficient documentation

## 2017-12-19 ENCOUNTER — Ambulatory Visit (HOSPITAL_COMMUNITY): Payer: Self-pay | Admitting: Licensed Clinical Social Worker

## 2017-12-26 ENCOUNTER — Other Ambulatory Visit (HOSPITAL_COMMUNITY): Payer: Self-pay

## 2017-12-26 DIAGNOSIS — F331 Major depressive disorder, recurrent, moderate: Secondary | ICD-10-CM

## 2017-12-26 MED ORDER — TRAZODONE HCL 50 MG PO TABS: 50.0000 mg | ORAL_TABLET | Freq: Every day | ORAL | 0 refills | Status: DC

## 2017-12-26 MED ORDER — VILAZODONE HCL 20 MG PO TABS
1.0000 | ORAL_TABLET | Freq: Every day | ORAL | 0 refills | Status: DC
Start: 2017-12-26 — End: 2018-02-18

## 2017-12-31 ENCOUNTER — Ambulatory Visit (HOSPITAL_COMMUNITY): Admitting: Psychiatry

## 2018-01-01 ENCOUNTER — Ambulatory Visit (HOSPITAL_COMMUNITY): Payer: Self-pay | Admitting: Licensed Clinical Social Worker

## 2018-01-03 ENCOUNTER — Ambulatory Visit (HOSPITAL_COMMUNITY): Payer: Self-pay | Admitting: Licensed Clinical Social Worker

## 2018-01-07 ENCOUNTER — Ambulatory Visit (INDEPENDENT_AMBULATORY_CARE_PROVIDER_SITE_OTHER): Admitting: Licensed Clinical Social Worker

## 2018-01-07 ENCOUNTER — Encounter (HOSPITAL_COMMUNITY): Payer: Self-pay | Admitting: Licensed Clinical Social Worker

## 2018-01-07 DIAGNOSIS — F331 Major depressive disorder, recurrent, moderate: Secondary | ICD-10-CM | POA: Diagnosis not present
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
## 2018-01-21 ENCOUNTER — Ambulatory Visit (INDEPENDENT_AMBULATORY_CARE_PROVIDER_SITE_OTHER): Admitting: Licensed Clinical Social Worker

## 2018-01-21 ENCOUNTER — Encounter (HOSPITAL_COMMUNITY): Payer: Self-pay | Admitting: Licensed Clinical Social Worker

## 2018-01-21 DIAGNOSIS — F331 Major depressive disorder, recurrent, moderate: Secondary | ICD-10-CM
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
## 2018-01-29 ENCOUNTER — Ambulatory Visit (HOSPITAL_COMMUNITY): Payer: Self-pay | Admitting: Licensed Clinical Social Worker

## 2018-02-04 ENCOUNTER — Ambulatory Visit (HOSPITAL_COMMUNITY): Payer: Self-pay | Admitting: Licensed Clinical Social Worker

## 2018-02-11 ENCOUNTER — Ambulatory Visit (HOSPITAL_COMMUNITY): Payer: Self-pay | Admitting: Licensed Clinical Social Worker

## 2018-02-18 ENCOUNTER — Ambulatory Visit (INDEPENDENT_AMBULATORY_CARE_PROVIDER_SITE_OTHER): Admitting: Psychiatry

## 2018-02-18 ENCOUNTER — Encounter (HOSPITAL_COMMUNITY): Payer: Self-pay | Admitting: Psychiatry

## 2018-02-18 VITALS — BP 107/74 | HR 88 | Ht 67.0 in | Wt 179.0 lb

## 2018-02-18 DIAGNOSIS — F419 Anxiety disorder, unspecified: Secondary | ICD-10-CM

## 2018-02-18 DIAGNOSIS — F331 Major depressive disorder, recurrent, moderate: Secondary | ICD-10-CM

## 2018-02-18 DIAGNOSIS — F900 Attention-deficit hyperactivity disorder, predominantly inattentive type: Secondary | ICD-10-CM

## 2018-02-18 MED ORDER — TRAZODONE HCL 50 MG PO TABS: 50.0000 mg | ORAL_TABLET | Freq: Every day | ORAL | 0 refills | Status: DC

## 2018-02-18 MED ORDER — GUANFACINE HCL ER 1 MG PO TB24: ORAL_TABLET | ORAL | 1 refills | Status: DC

## 2018-02-18 MED ORDER — VILAZODONE HCL 20 MG PO TABS: 1.0000 | ORAL_TABLET | Freq: Every day | ORAL | 0 refills | Status: DC

## 2018-02-18 NOTE — Progress Notes (Signed)
[place] MD/PA/NP OP Progress Note  02/18/2018 1:35 PM Angela [name]  MRN:  [id number]  Chief Complaint: I am doing better on Viibryd but my attention and focus is not good.  I gained weight.  HPI: Dolly came for her follow-up appointment.  She is taking Viibryd 20 mg which is helping her anxiety and depression.  However she stopped the Concerta because of headaches and she has noticed difficulty in attention and concentration.  She is graduate but now she is doing post graduation.  She continues to work part-time.  She is seeing neurology who was prescribed Lamictal and other medications for headache.  Patient does not want any stimulant however interested to try non-stimulant for her ADD.  She endorsed weight gain but hoping to try lose weight.  She has no tremors, shakes or any EPS.  She graduated from Estate [employer].  Patient denies any suicidal thoughts or homicidal thought.  She lives with a roommate since she broke up with friend.  Patient denies drinking or using any illegal substances.  Visit Diagnosis:    ICD-10-CM   1. Attention deficit hyperactivity disorder (ADHD), predominantly inattentive type F90.0 guanFACINE (INTUNIV) 1 MG TB24 ER tablet  2. Major depressive disorder, recurrent episode, moderate (HCC) F33.1 Vilazodone HCl (VIIBRYD) 20 MG TABS    traZODone (DESYREL) 50 MG tablet    Past Psychiatric History: Reviewed Patient has history of ADD, anxiety, major depression since school age. She has psychological testing done by psychologist Greggory Brandy.She had tried Remeron, amitriptyline, Focalin, clonidine, Lexapro and Effexor.   She also tried Concerta that caused weight gain. We did gene testing and results shows Lexapro, amitriptyline, Wellbutrin, Viibryd, Pristiq and good selection. Patient denies any history of suicidal attempt or any psychiatric inpatient treatment.  Past Medical History:  Past Medical History:  Diagnosis Date  . Acne   . Biliary dyskinesia 11/2013    symptomatic  . Chronic constipation   . Closed head injury 2012  . Cluster headaches   . Complication of anesthesia    wakes up combative  . Delayed gastric emptying   . GERD (gastroesophageal reflux disease)   . Heart murmur   . Muscle spasms of neck    receives PT; needs to have neck support during surgery, per mother  . Pulmonary artery stenosis    left    Past Surgical History:  Procedure Laterality Date  . [place] STUDY N/A 07/26/2015   Procedure: [place] STUDY;  Surgeon: Manus Gunning, MD;  Location: [facility];  Service: Gastroenterology;  Laterality: N/A;  . CHOLECYSTECTOMY N/A 12/09/2013   Procedure: LAPAROSCOPIC CHOLECYSTECTOMY WITH INTRAOPERATIVE CHOLANGIOGRAM;  Surgeon: Gwenyth Ober, MD;  Location: [facility];  Service: General;  Laterality: N/A;  . ESOPHAGEAL MANOMETRY N/A 07/26/2015   Procedure: ESOPHAGEAL MANOMETRY (EM);  Surgeon: Manus Gunning, MD;  Location: [facility];  Service: Gastroenterology;  Laterality: N/A;  . ESOPHAGOGASTRODUODENOSCOPY (EGD) WITH PROPOFOL  07/26/2012  . HYMENECTOMY  12/21/2006  . LAPAROSCOPIC APPENDECTOMY N/A 09/28/2013   Procedure: APPENDECTOMY LAPAROSCOPIC;  Surgeon: Gwenyth Ober, MD;  Location: [facility];  Service: General;  Laterality: N/A;  . LAPAROSCOPIC CHOLECYSTECTOMY  12/09/2013  . NASAL SEPTUM SURGERY  2009   x 2  . ROBOTIC ASSISTED LAPAROSCOPIC OVARIAN CYSTECTOMY Right 01/20/2011  . TONSILLECTOMY AND ADENOIDECTOMY  1999    Family Psychiatric History: Reviewed  Family History:  Family History  Problem Relation Age of Onset  . Colon cancer Maternal Grandmother   . Other  Maternal Grandfather        pituitary tumor  . Diabetes Paternal Grandmother   . Heart Problems Paternal Grandfather        A fib, CHF    Social History:  Social History   Socioeconomic History  . Marital status: Single    Spouse name: Not on file  . Number of children: 0  . Years of education: [employer]  . Highest education level: Not  on file  Occupational History  . Occupation: student    Comment: [employer]  Social Needs  . Financial resource strain: Not on file  . Food insecurity:    Worry: Not on file    Inability: Not on file  . Transportation needs:    Medical: Not on file    Non-medical: Not on file  Tobacco Use  . Smoking status: Never Smoker  . Smokeless tobacco: Never Used  Substance and Sexual Activity  . Alcohol use: No  . Drug use: No  . Sexual activity: Never    Birth control/protection: None  Lifestyle  . Physical activity:    Days per week: Not on file    Minutes per session: Not on file  . Stress: Not on file  Relationships  . Social connections:    Talks on phone: Not on file    Gets together: Not on file    Attends religious service: Not on file    Active member of club or organization: Not on file    Attends meetings of clubs or organizations: Not on file    Relationship status: Not on file  Other Topics Concern  . Not on file  Social History Narrative   Pt lives at home with family.   Caffeine Use: very little    Allergies:  Allergies  Allergen Reactions  . Cyanoacrylate Rash    Component of dermabond  . Clavulanic Acid Nausea And Vomiting    Pt can have penicillins  . Compazine [Prochlorperazine Edisylate] Other (See Comments)    COMBATIVE AND BELLIGERENT  . Imipramine Rash  . Ketamine Rash and Other (See Comments)    HALLUCINATIONS AND COMBATIVE  . Prednisone Other (See Comments)    Allergy tests confirmed affects skin and mood.   . Topamax [Topiramate] Hives and Rash    COMBATIVE  . Haldol [Haloperidol] Other (See Comments)    Becomes combative and beligerent  . Adhesive [Tape] Rash    BLISTERS  . Flagyl [Metronidazole] Rash  . Other Rash    Monocryl    Metabolic Disorder Labs: No results found for: HGBA1C, MPG No results found for: PROLACTIN No results found for: CHOL, TRIG, HDL, CHOLHDL, VLDL, LDLCALC No results found for: TSH  Therapeutic Level Labs: No  results found for: LITHIUM No results found for: VALPROATE No components found for:  CBMZ  Current Medications: Current Outpatient Medications  Medication Sig Dispense Refill  . cetirizine (ZYRTEC) 10 MG tablet Take 10 mg by mouth at bedtime.     Marland Kitchen DEXILANT 60 MG capsule TAKE 1 CAPSULE DAILY 90 capsule 3  . EMGALITY 120 MG/ML SOAJ INJECT THE CONTENTS OF 2 PENS INTO THE SKIN ONCE FOR FIRST DOSE  THEN BEGIN MAINTENANCE DOSE 28 DAYS LATER  0  . EPINEPHrine (EPIPEN 2-PAK) 0.3 mg/0.3 mL IJ SOAJ injection Inject 0.3 mLs (0.3 mg total) into the muscle as needed. 2 Device 1  . Erenumab-aooe (AIMOVIG) 70 MG/ML SOAJ Inject 70 mg into the skin every 30 (thirty) days.    Marland Kitchen ketorolac (TORADOL)  30 MG/ML injection Inject once every 12 hours as needed for severe headache    . lamoTRIgine (LAMICTAL) 200 MG tablet TK 1 T PO QD  6  . levocetirizine (XYZAL) 5 MG tablet Take 1 tablet (5 mg total) by mouth every evening. (Patient not taking: Reported on 09/06/2017) 30 tablet 5  . magnesium oxide (MAG-OX) 400 MG tablet Take by mouth.    . methylphenidate 36 MG PO CR tablet Take 1 tablet (36 mg total) by mouth daily. Do not refill until 10/06/17 30 tablet 0  . naratriptan (AMERGE) 2.5 MG tablet 2.5 mg at onset of headache, may repeat in 4 hours if needed    . ondansetron (ZOFRAN) 4 MG tablet Take by mouth.    . traZODone (DESYREL) 50 MG tablet Take 1 tablet (50 mg total) by mouth at bedtime. 90 tablet 0  . Vilazodone HCl (VIIBRYD) 20 MG TABS Take 1 tablet (20 mg total) by mouth daily. 90 tablet 0   No current facility-administered medications for this visit.      Musculoskeletal: Strength & Muscle Tone: within normal limits Gait & Station: normal Patient leans: N/A  Psychiatric Specialty Exam: ROS  Blood pressure 107/74, pulse 88, height 5\' 7"  (1.702 m), weight 179 lb (81.2 kg), SpO2 97 %.There is no height or weight on file to calculate BMI.  General Appearance: Casual and Well Groomed  Eye Contact:  Good   Speech:  Clear and Coherent  Volume:  Normal  Mood:  Pleasant  Affect:  Appropriate  Thought Process:  Goal Directed  Orientation:  Full (Time, Place, and Person)  Thought Content: Logical   Suicidal Thoughts:  No  Homicidal Thoughts:  No  Memory:  Immediate;   Good Recent;   Good Remote;   Good  Judgement:  Good  Insight:  Good  Psychomotor Activity:  Normal  Concentration:  Concentration: Fair and Attention Span: Fair  Recall:  Good  Fund of Knowledge: Good  Language: Good  Akathisia:  No  Handed:  Right  AIMS (if indicated): not done  Assets:  Communication Skills [employer] [place] Talents/Skills Vocational/Educational  ADL's:  Intact  Cognition: WNL  Sleep:  Good   Screenings:   Assessment and Plan: Major depressive disorder, recurrent.  Anxiety disorder NOS.  Attention deficit disorder, inattentive type.  Patient is no longer taking Concerta due to headaches.  She admitted weight gain and having difficulty in attention and concentration.  She is interested to try non-stimulant medication.  I recommended to try Intuniv 1 mg for 1 week and then 2 mg.  Continue Viibryd 20 mg daily and trazodone 50 mg at bedtime.  She is getting Lamictal from her neurology.  Discussed her weight gain and encouraged exercise and weight loss program.  Recommended to call us back if she has any question or any concern.  Follow-up in 3 months.   Kathlee Nations, MD 02/18/2018, 1:35 PM

## 2018-02-25 ENCOUNTER — Ambulatory Visit (HOSPITAL_COMMUNITY): Payer: Self-pay | Admitting: Licensed Clinical Social Worker

## 2018-03-18 ENCOUNTER — Encounter (HOSPITAL_COMMUNITY): Payer: Self-pay | Admitting: Licensed Clinical Social Worker

## 2018-03-18 ENCOUNTER — Ambulatory Visit (INDEPENDENT_AMBULATORY_CARE_PROVIDER_SITE_OTHER): Admitting: Licensed Clinical Social Worker

## 2018-03-18 DIAGNOSIS — F900 Attention-deficit hyperactivity disorder, predominantly inattentive type: Secondary | ICD-10-CM | POA: Diagnosis not present

## 2018-03-18 DIAGNOSIS — F331 Major depressive disorder, recurrent, moderate: Secondary | ICD-10-CM | POA: Diagnosis not present

## 2018-03-18 NOTE — Progress Notes (Signed)
   THERAPIST PROGRESS NOTE  Session Time: 1:10-2pm  Participation Level: Active  Behavioral Response: CasualAlertDepressed  Type of Therapy: Individual Therapy  Treatment Goals addressed: Improve psychiatric symptoms, Controlled Behavior, Moderated Mood, Improve Unhelpful Thought Patterns, Emotional Regulation Skills (Moderate moods),  Feel and express a full Range of Emotions, Learn about Diagnosis, Healthy Coping Skills.  Interventions: CBT  Summary: Angela [name] is a 22 y.o. female who presents for her individual counseling session. She presents with depressed mood.  Pt discussed her psychiatric symptoms and current life events. Pt saw dr. Adele Schilder a month ago and he changed one of her meds but she only took it a week because it didn't seem to work. Asked pt if she wanted to discuss her meds with Regan CMA. She did not. Pt reports she is having a lot of self doubts in grad school. Educated pt on cognitive distortions: recognizing, black and white thinking, personalizing, filter thinking, catastrophising. Discussing options for migraine support groups, asking for accommodations at school, talking to her advisor and professor. Pt brought in her journal where she journaled her feelings daily. Reviewed with pt. Encouraged pt to continue to use mindfulness skills.   Suicidal/Homicidal: Nowithout intent/plan  Therapist Response: Assessed pt's current functioning and reviewed progress. Assisted pt processing journaling, medications, migraines and coping skills.  Asisted pt processing for the management of her stressors.  Plan: Return again in 2weeks with journal  Diagnosis: Axis I:  Major Depressive disorder, recurrent episode, moderate    Arnella Pralle S, LCAS 03/18/2018

## 2018-04-01 ENCOUNTER — Ambulatory Visit (HOSPITAL_COMMUNITY): Payer: Self-pay | Admitting: Licensed Clinical Social Worker

## 2018-04-11 ENCOUNTER — Ambulatory Visit (INDEPENDENT_AMBULATORY_CARE_PROVIDER_SITE_OTHER): Admitting: Gastroenterology

## 2018-04-11 ENCOUNTER — Encounter: Payer: Self-pay | Admitting: Gastroenterology

## 2018-04-11 VITALS — BP 104/72 | HR 76 | Ht 67.0 in | Wt 177.4 lb

## 2018-04-11 DIAGNOSIS — K625 Hemorrhage of anus and rectum: Secondary | ICD-10-CM

## 2018-04-11 DIAGNOSIS — R109 Unspecified abdominal pain: Secondary | ICD-10-CM

## 2018-04-11 DIAGNOSIS — K529 Noninfective gastroenteritis and colitis, unspecified: Secondary | ICD-10-CM | POA: Diagnosis not present

## 2018-04-11 DIAGNOSIS — K219 Gastro-esophageal reflux disease without esophagitis: Secondary | ICD-10-CM

## 2018-04-11 MED ORDER — NA SULFATE-K SULFATE-MG SULF 17.5-3.13-1.6 GM/177ML PO SOLN: 1.0000 | Freq: Once | ORAL | 0 refills | Status: AC

## 2018-04-11 MED ORDER — FAMOTIDINE 20 MG PO TABS: 20.0000 mg | ORAL_TABLET | Freq: Two times a day (BID) | ORAL | 3 refills | Status: DC | PRN

## 2018-04-11 MED ORDER — DEXLANSOPRAZOLE 30 MG PO CPDR: 30.0000 mg | DELAYED_RELEASE_CAPSULE | Freq: Every day | ORAL | 3 refills | Status: DC

## 2018-04-11 NOTE — Patient Instructions (Addendum)
We have sent the following medications to your pharmacy for you to pick up at your convenience: Dexilant, Pepcid   You have been scheduled for a colonoscopy. Please follow written instructions given to you at your visit today.  Please pick up your prep supplies at the pharmacy within the next 1-3 days. If you use inhalers (even only as needed), please bring them with you on the day of your procedure. Your physician has requested that you go to www.[email or web address].com and enter the access code given to you at your visit today. This web site gives a general overview about your procedure. However, you should still follow specific instructions given to you by our office regarding your preparation for the procedure.

## 2018-04-11 NOTE — Progress Notes (Signed)
HPI :  22 year old female here for a follow-up visit:  Please see intake history for full details of her case. She's been followed for reflux ongoing since childhood. She is also had ongoing abdominal pain and history of dysphagia. She is carried a diagnosis of idiopathic gastroparesis confirmed on prior gastric imaging study. Has been on Reglan in the past but didn't help, had not been on domperidone before. Workup as outlined below.  Since her last visit patient had a 24 HR pH impedance study done for symptoms of reflux, while on PPI, and esophageal manometry given symptoms of dysphagia.  24 HR pH impedance test was a normal study ON PPI which indicated good control of acid reflux on her present regimen - Dexilant 60mg . Her manometry was otherwise an abnormal study, with 100% of swallows failed. There was absent peristalsis on [place] classification, with a normal IRP. Overall, no evidence of achalasia but she has significant dysmotility with absent peristalsis causing her symptoms. Given this finding and her history of gastroparesis, recommended a Rheumatology evaluation for scleroderma or other collage vascular diseases. She was seen by Rheumatology and they could not find any specific diagnosis.   She ran out of her Dexilant about a month ago, using number of over-the-counter PPI but states Dexilant has historically worked well for her. She does not have much dysphagia right now, she is eating okay, not much vomiting but has some nausea. Overall her upper tract symptoms have been fairly well-controlled. We discussed options in regards to long-term regimen.  Main issue at this time is her bowels. She has had some ongoing looser stools with periodic blood in the stools for the past 6 months or so. She has occasional pain in her rectum. She has lower abdominal pain at the present most of the time. She's been trying to avoid dairy and gluten. She has chronic abdominal tenderness. She's taken some  Imodium which didn't help. She had a CT scan of her abdomen and pelvis in January 2019 which was normal (Care everywhere). She's never had a prior colonoscopy.   EGD 07/26/12 - mild esophagitis, otherwise normal Gastric emptying study - 12/05/13 - gastroparesis   Past Medical History:  Diagnosis Date  . Acne   . ADHD (attention deficit hyperactivity disorder)   . Biliary dyskinesia 11/2013   symptomatic  . Chronic constipation   . Closed head injury 2012  . Cluster headaches   . Complication of anesthesia    wakes up combative  . Delayed gastric emptying   . GERD (gastroesophageal reflux disease)   . Heart murmur   . Muscle spasms of neck    receives PT; needs to have neck support during surgery, per mother  . Pulmonary artery stenosis    left     Past Surgical History:  Procedure Laterality Date  . [place] STUDY N/A 07/26/2015   Procedure: [place] STUDY;  Surgeon: Manus Gunning, MD;  [facility]: [facility];  Service: Gastroenterology;  Laterality: N/A;  . CHOLECYSTECTOMY N/A 12/09/2013   Procedure: LAPAROSCOPIC CHOLECYSTECTOMY WITH INTRAOPERATIVE CHOLANGIOGRAM;  Surgeon: Gwenyth Ober, MD;  Location: [facility];  Service: General;  Laterality: N/A;  . ESOPHAGEAL MANOMETRY N/A 07/26/2015   Procedure: ESOPHAGEAL MANOMETRY (EM);  Surgeon: Manus Gunning, MD;  [facility];  Service: Gastroenterology;  Laterality: N/A;  . ESOPHAGOGASTRODUODENOSCOPY (EGD) WITH PROPOFOL  07/26/2012  . HYMENECTOMY  12/21/2006  . LAPAROSCOPIC APPENDECTOMY N/A 09/28/2013   Procedure: APPENDECTOMY LAPAROSCOPIC;  Surgeon: Kathryne Eriksson  Hulen Skains, MD;  Location: [facility];  Service: General;  Laterality: N/A;  . LAPAROSCOPIC CHOLECYSTECTOMY  12/09/2013  . NASAL SEPTUM SURGERY  2009   x 2  . ROBOTIC ASSISTED LAPAROSCOPIC OVARIAN CYSTECTOMY Right 01/20/2011  . TONSILLECTOMY AND ADENOIDECTOMY  1999   Family History  Problem Relation Age of Onset  . Colon cancer Maternal Grandmother   . Other  Maternal Grandfather        pituitary tumor  . Diabetes Paternal Grandmother   . Heart Problems Paternal Grandfather        A fib, CHF   Social History   Tobacco Use  . Smoking status: Never Smoker  . Smokeless tobacco: Never Used  Substance Use Topics  . Alcohol use: No  . Drug use: No   Current Outpatient Medications  Medication Sig Dispense Refill  . cetirizine (ZYRTEC) 10 MG tablet Take 10 mg by mouth at bedtime.     Marland Kitchen EPINEPHrine (EPIPEN 2-PAK) 0.3 mg/0.3 mL IJ SOAJ injection Inject 0.3 mLs (0.3 mg total) into the muscle as needed. 2 Device 1  . Fremanezumab-vfrm (AJOVY) 225 MG/1.5ML SOSY Inject into the skin every 30 (thirty) days.    Marland Kitchen guanFACINE (INTUNIV) 1 MG TB24 ER tablet Take 1 mg by mouth daily. One tab in am for 1 week and then 2 tab in am    . lamoTRIgine (LAMICTAL) 200 MG tablet TK 1 T PO QD  6  . magnesium oxide (MAG-OX) 400 MG tablet Take by mouth.    . naratriptan (AMERGE) 2.5 MG tablet 2.5 mg at onset of headache, may repeat in 4 hours if needed    . ondansetron (ZOFRAN) 4 MG tablet Take by mouth.    . traZODone (DESYREL) 50 MG tablet Take 1 tablet (50 mg total) by mouth at bedtime. 90 tablet 0   No current facility-administered medications for this visit.    Allergies  Allergen Reactions  . Cyanoacrylate Rash    Component of dermabond  . Clavulanic Acid Nausea And Vomiting    Pt can have penicillins  . Compazine [Prochlorperazine Edisylate] Other (See Comments)    COMBATIVE AND BELLIGERENT  . Imipramine Rash  . Ketamine Rash and Other (See Comments)    HALLUCINATIONS AND COMBATIVE  . Prednisone Other (See Comments)    Allergy tests confirmed affects skin and mood.   . Topamax [Topiramate] Hives and Rash    COMBATIVE  . Haldol [Haloperidol] Other (See Comments)    Becomes combative and beligerent  . Adhesive [Tape] Rash    BLISTERS  . Flagyl [Metronidazole] Rash  . Other Rash    Monocryl     Review of Systems: All systems reviewed and  negative except where noted in HPI.   Lab Results  Component Value Date   WBC 12.5 (H) 12/10/2013   HGB 14.3 10/14/2016   HCT 42.0 10/14/2016   MCV 88.4 12/10/2013   PLT 303 12/10/2013    Lab Results  Component Value Date   CREATININE 0.80 10/14/2016   BUN 9 10/14/2016   NA 143 10/14/2016   K 3.8 10/14/2016   CL 106 10/14/2016   CO2 26 06/24/2015    Lab Results  Component Value Date   ALT 37 (H) 12/10/2013   AST 26 12/10/2013   ALKPHOS 72 12/10/2013   BILITOT 0.5 12/10/2013     Physical Exam: BP 104/72   Pulse 76   Ht 5\' 7"  (1.702 m)   Wt 177 lb 6 oz (80.5 kg)  BMI 27.78 kg/m  Constitutional: Pleasant,well-developed, female in no acute distress. HEENT: Normocephalic and atraumatic. Conjunctivae are normal. No scleral icterus. Neck supple.  Cardiovascular: Normal rate, regular rhythm.  Pulmonary/chest: Effort normal and breath sounds normal. No wheezing, rales or rhonchi. Abdominal: Soft, nondistended, nontender. Bowel sounds active throughout. There are no masses palpable. No hepatomegaly. DRE - no fissure, no mass lesion - standby [facility] CMA Extremities: no edema Lymphadenopathy: No cervical adenopathy noted. Neurological: Alert and oriented to person place and time. Skin: Skin is warm and dry. No rashes noted. Psychiatric: Normal mood and affect. Behavior is normal.   ASSESSMENT AND PLAN: 22 year old female here for reassessment of the following issues:  Diarrhea / abdominal pain / rectal bleeding - given the symptoms which have been ongoing for the past several months, I'm recommending a colonoscopy to further evaluate, ensure no evidence of IBD. DRE is normal in clinic, no fissure appreciated. I discussed risks and benefits of colonoscopy and she wanted to proceed. Further recommendations pending the results. I believe she has been screened for celiac disease in the past although don't see any records for this lab, would recommend screening for this as  well given her other chronic abdominal complaints.  GERD / Gastroparesis - generally doing okay regarding her upper tract symptoms recently. Discussed options. She does feel Dexilant has worked much better than other options in the past. We have discussed long-term risks of PPIs and she wants to continue with that. She was willing to try this at a lower dose, we'll go with 30 mg once daily and see how she does with this. I otherwise recommended she stop Zantac and replace with Pepcid if she needs to use something PRN.  [place] Cellar, MD [facility]

## 2018-04-12 ENCOUNTER — Other Ambulatory Visit: Payer: Self-pay

## 2018-04-12 ENCOUNTER — Telehealth: Payer: Self-pay

## 2018-04-12 DIAGNOSIS — R140 Abdominal distension (gaseous): Secondary | ICD-10-CM

## 2018-04-12 DIAGNOSIS — R109 Unspecified abdominal pain: Secondary | ICD-10-CM

## 2018-04-12 DIAGNOSIS — K219 Gastro-esophageal reflux disease without esophagitis: Secondary | ICD-10-CM

## 2018-04-12 NOTE — Telephone Encounter (Signed)
Okay we can hold off on further celiac testing. Thanks

## 2018-04-12 NOTE — Progress Notes (Signed)
Add orders to test for Celiac disease per Dr. Havery Moros

## 2018-04-12 NOTE — Telephone Encounter (Signed)
-----   Message from Yetta Flock, MD sent at 04/11/2018  5:53 PM EST ----- Regarding: forgot lab Hi Jan, After today's clinic visit I forgot to mention to the patient I had wanted to test for celiac disease (TTG IgA and total IgA level). Can you help order these and ask her to go to the lab? If she states she has tested negative for celiac before we can otherwise hold off. Thanks

## 2018-04-12 NOTE — Telephone Encounter (Signed)
Called and spoke to pt. She tested negative 2 years ago. Please advise.

## 2018-04-16 ENCOUNTER — Encounter: Payer: Self-pay | Admitting: Gastroenterology

## 2018-04-16 ENCOUNTER — Ambulatory Visit (AMBULATORY_SURGERY_CENTER): Admitting: Gastroenterology

## 2018-04-16 VITALS — BP 108/72 | HR 56 | Temp 98.2°F | Resp 18 | Ht 67.0 in | Wt 177.0 lb

## 2018-04-16 DIAGNOSIS — D179 Benign lipomatous neoplasm, unspecified: Secondary | ICD-10-CM

## 2018-04-16 DIAGNOSIS — R1030 Lower abdominal pain, unspecified: Secondary | ICD-10-CM

## 2018-04-16 DIAGNOSIS — K625 Hemorrhage of anus and rectum: Secondary | ICD-10-CM

## 2018-04-16 DIAGNOSIS — R197 Diarrhea, unspecified: Secondary | ICD-10-CM | POA: Diagnosis not present

## 2018-04-16 DIAGNOSIS — R194 Change in bowel habit: Secondary | ICD-10-CM

## 2018-04-16 MED ORDER — SODIUM CHLORIDE 0.9 % IV SOLN: 500.0000 mL | Freq: Once | INTRAVENOUS | Status: DC

## 2018-04-16 NOTE — Patient Instructions (Addendum)
YOU HAD AN ENDOSCOPIC PROCEDURE TODAY AT [facility]:   Refer to the procedure report that was given to you for any specific questions about what was found during the examination.  If the procedure report does not answer your questions, please call your gastroenterologist to clarify.  If you requested that your care partner not be given the details of your procedure findings, then the procedure report has been included in a sealed envelope for you to review at your convenience later.  YOU SHOULD EXPECT: Some feelings of bloating in the abdomen. Passage of more gas than usual.  Walking can help get rid of the air that was put into your GI tract during the procedure and reduce the bloating. If you had a lower endoscopy (such as a colonoscopy or flexible sigmoidoscopy) you may notice spotting of blood in your stool or on the toilet paper. If you underwent a bowel prep for your procedure, you may not have a normal bowel movement for a few days.  Please Note:  You might notice some irritation and congestion in your nose or some drainage.  This is from the oxygen used during your procedure.  There is no need for concern and it should clear up in a day or so.  SYMPTOMS TO REPORT IMMEDIATELY:   Following lower endoscopy (colonoscopy or flexible sigmoidoscopy):  Excessive amounts of blood in the stool  Significant tenderness or worsening of abdominal pains  Swelling of the abdomen that is new, acute  Fever of 100F or higher   For urgent or emergent issues, a gastroenterologist can be reached at any hour by calling [phone].   DIET:  We do recommend a small meal at first, but then you may proceed to your regular diet.  Drink plenty of fluids but you should avoid alcoholic beverages for 24 hours.  ACTIVITY:  You should plan to take it easy for the rest of today and you should NOT DRIVE or use heavy machinery until tomorrow (because of the sedation medicines used during the test).     FOLLOW UP: Our staff will call the number listed on your records the next business day following your procedure to check on you and address any questions or concerns that you may have regarding the information given to you following your procedure. If we do not reach you, we will leave a message.  However, if you are feeling well and you are not experiencing any problems, there is no need to return our call.  We will assume that you have returned to your regular daily activities without incident.  If any biopsies were taken you will be contacted by phone or by letter within the next 1-3 weeks.  Please call us at [phone] if you have not heard about the biopsies in 3 weeks.    SIGNATURES/CONFIDENTIALITY: You and/or your care partner have signed paperwork which will be entered into your electronic medical record.  These signatures attest to the fact that that the information above on your After Visit Summary has been reviewed and is understood.  Full responsibility of the confidentiality of this discharge information lies with you and/or your care-partner.     You may resume your current medications today. Await biopsy results. Dr. Havery Moros will discuss with patient and her mother removal of lipoma in the sigmoid colon to be done at the hospital.  Dr. Havery Moros will call you about scheduling this procedure. Please call if any questions or concerns.

## 2018-04-16 NOTE — Op Note (Addendum)
[place] Patient Name: Angela [name] Procedure Date: 04/16/2018 3:49 PM MRN: [id number] Endoscopist: Remo Lipps P. Havery Moros , MD Age: 22 Referring MD:  Date of Birth: 1995/11/26 Gender: Female Account #: [id number] Procedure:                Colonoscopy Indications:              Clinically significant diarrhea of unexplained                            origin, Hematochezia, Lower abdominal pain Medicines:                Monitored Anesthesia Care Procedure:                Pre-Anesthesia Assessment:                           - Prior to the procedure, a History and Physical                            was performed, and patient medications and                            allergies were reviewed. The patient's tolerance of                            previous anesthesia was also reviewed. The risks                            and benefits of the procedure and the sedation                            options and risks were discussed with the patient.                            All questions were answered, and informed consent                            was obtained. Prior Anticoagulants: The patient has                            taken no previous anticoagulant or antiplatelet                            agents. ASA Grade Assessment: II - A patient with                            mild systemic disease. After reviewing the risks                            and benefits, the patient was deemed in                            satisfactory condition to undergo the procedure.  After obtaining informed consent, the colonoscope                            was passed under direct vision. Throughout the                            procedure, the patient's blood pressure, pulse, and                            oxygen saturations were monitored continuously. The                            Colonoscope was introduced through the anus and                            advanced to the  the terminal ileum, with                            identification of the appendiceal orifice and IC                            valve. The colonoscopy was performed without                            difficulty. The patient tolerated the procedure                            well. The quality of the bowel preparation was                            adequate. The terminal ileum, ileocecal valve,                            appendiceal orifice, and rectum were photographed. Scope In: 3:58:48 PM Scope Out: 4:23:19 PM Scope Withdrawal Time: 0 hours 16 minutes 11 seconds  Total Procedure Duration: 0 hours 24 minutes 31 seconds  Findings:                 The perianal and digital rectal examinations were                            normal.                           The terminal ileum appeared normal.                           There was a large lipoma, in the sigmoid colon,                            roughly 30cm from the anal verge. It was broad                            based, removal was not attempted on this exam..  The terminal ileum appeared normal.                           The colon was tortous. The exam was otherwise                            without abnormality. No inflammatory changes.                           Biopsies for histology were taken with a cold                            forceps from the right colon and left colon for                            evaluation of microscopic colitis. Complications:            No immediate complications. Estimated blood loss:                            Minimal. Estimated Blood Loss:     Estimated blood loss was minimal. Impression:               - The examined portion of the ileum was normal.                           - Large lipoma in the sigmoid colon.                           - The examined portion of the ileum was normal.                           - The examination was otherwise normal.                           -  Biopsies were taken with a cold forceps from the                            right colon and left colon for evaluation of                            microscopic colitis.                           Overall, it's possible the large lipoma could cause                            intermittent pain, potentially bleeding. Given the                            size of it and likelihood for further growth,                            endoscopic removal is reasonable. Will discuss with  patient, as the lesion is higher risk for bleeding                            with removal. Recommendation:           - Patient has a contact number available for                            emergencies. The signs and symptoms of potential                            delayed complications were discussed with the                            patient. Return to normal activities tomorrow.                            Written discharge instructions were provided to the                            patient.                           - Resume previous diet.                           - Continue present medications.                           - Await pathology results.                           - Removal of lipoma to be done at the hospital,                            will discuss with the patient Carlota Raspberry. , MD 04/16/2018 4:35:01 PM This report has been signed electronically.

## 2018-04-16 NOTE — Progress Notes (Signed)
Report given to PACU, vss 

## 2018-04-16 NOTE — Progress Notes (Signed)
Called to room to assist during endoscopic procedure.  Patient ID and intended procedure confirmed with present staff. Received instructions for my participation in the procedure from the performing physician.  

## 2018-04-16 NOTE — Progress Notes (Signed)
No problems noted in the recovery room. maw 

## 2018-04-17 ENCOUNTER — Telehealth: Payer: Self-pay | Admitting: *Deleted

## 2018-04-17 NOTE — Telephone Encounter (Signed)
  Follow up Call-  Call back number 04/16/2018  Post procedure Call Back phone  # [phone]  Permission to leave phone message Yes  Some recent data might be hidden     Patient questions:  Do you have a fever, pain , or abdominal swelling? No. Pain Score  0 *  Have you tolerated food without any problems? Yes.    Have you been able to return to your normal activities? Yes.    Do you have any questions about your discharge instructions: Diet   No. Medications  No. Follow up visit  No.  Do you have questions or concerns about your Care? No.  Actions: * If pain score is 4 or above: No action needed, pain <4. Still has some "gas pain" .  She is eating and drinking without problems.

## 2018-04-17 NOTE — Telephone Encounter (Signed)
No answer, left message to call if questions or concerns,

## 2018-05-02 ENCOUNTER — Telehealth: Payer: Self-pay | Admitting: Gastroenterology

## 2018-05-02 NOTE — Telephone Encounter (Signed)
Called patient - full discussion in path result note.

## 2018-05-02 NOTE — Telephone Encounter (Signed)
Hi Dr. Havery Moros, pt returned your call regarding her path results. Please call her again. Thank you.

## 2018-05-03 ENCOUNTER — Other Ambulatory Visit: Payer: Self-pay

## 2018-05-03 ENCOUNTER — Ambulatory Visit (AMBULATORY_SURGERY_CENTER): Payer: Self-pay

## 2018-05-03 ENCOUNTER — Telehealth: Payer: Self-pay | Admitting: Gastroenterology

## 2018-05-03 VITALS — Ht 67.0 in | Wt 176.0 lb

## 2018-05-03 DIAGNOSIS — D175 Benign lipomatous neoplasm of intra-abdominal organs: Secondary | ICD-10-CM

## 2018-05-03 NOTE — Telephone Encounter (Signed)
See results notes for additional details. 

## 2018-05-03 NOTE — Progress Notes (Signed)
Denies allergies to eggs or soy products. Denies complication of anesthesia or sedation. Denies use of weight loss medication. Denies use of O2.   Emmi instructions declined.  

## 2018-05-03 NOTE — Telephone Encounter (Signed)
Pt returned your call and would like a call back .. °

## 2018-05-13 ENCOUNTER — Ambulatory Visit (INDEPENDENT_AMBULATORY_CARE_PROVIDER_SITE_OTHER): Admitting: Psychiatry

## 2018-05-13 ENCOUNTER — Encounter (HOSPITAL_COMMUNITY): Payer: Self-pay | Admitting: Psychiatry

## 2018-05-13 VITALS — BP 108/75 | HR 78 | Ht 67.0 in | Wt 176.0 lb

## 2018-05-13 DIAGNOSIS — F331 Major depressive disorder, recurrent, moderate: Secondary | ICD-10-CM

## 2018-05-13 DIAGNOSIS — F411 Generalized anxiety disorder: Secondary | ICD-10-CM | POA: Diagnosis not present

## 2018-05-13 MED ORDER — TRAZODONE HCL 100 MG PO TABS: 100.0000 mg | ORAL_TABLET | Freq: Every day | ORAL | 0 refills | Status: DC

## 2018-05-13 MED ORDER — VILAZODONE HCL 20 MG PO TABS: 20.0000 mg | ORAL_TABLET | Freq: Every day | ORAL | 0 refills | Status: DC

## 2018-05-13 NOTE — Progress Notes (Addendum)
[facility] MD/PA/NP OP Progress Note  05/13/2018 10:15 AM Angela [name]  MRN:  [id number]  Chief Complaint: I am doing okay.  I do not think Intuniv help my attention focus and I stopped it.  HPI: Angela [name] came for her follow-up appointment.  On her last visit we started Intuniv which she tried up to 2 mg to help her attention and focus but did not see any improvement and decided to stop.  She still struggle some time with attention and concentration but she does not want to try any other medication.  In the past she had tried stimulant and clonidine but she is having side effects.  She is feeling okay.  She is stressed and anxious about her school.  She is doing post graduate from [employer].  She admitted some time her school is very stressful.  She also worked part-time.  She endorsed that she has sleep apnea but she not able to adjust to use CPAP machine.  She tried for 6 months but she gave up as she continues to struggle to wear mask.  She feels trazodone helping but still there are nights when she have trouble sleeping.  She is taking Lamictal which is helping her headaches and given by neurology.  She feel that she should come off from [place] because she is doing better in some time she feels it is causing fog in her thinking.  She denies any suicidal thoughts or homicidal thought.  She lives with her roommate.  She saw Angela [name] once but did not have any more appointment.  She endorsed that she has been very busy in her school.  She spent Thanksgiving stating and she is not sure if she can enjoy Christmas because of the school.  She denies drinking or using any illegal substances.  Denies any irritability, anger, mania or any psychosis.  She denies any suicidal thoughts.  She has no tremors or shakes.    Visit Diagnosis:    ICD-10-CM   1. GAD (generalized anxiety disorder) F41.1 Vilazodone HCl (VIIBRYD) 20 MG TABS  2. Major depressive disorder, recurrent episode, moderate (HCC) F33.1 traZODone  (DESYREL) 100 MG tablet    Vilazodone HCl (VIIBRYD) 20 MG TABS    Past Psychiatric History: Viewed. History of ADD, anxiety, major depression since his school age.  Had psychological testing by [facility]  Tried Remeron, amitriptyline, Focalin, clonidine, Lexapro, Concerta (Headaches), Intuniv and Effexor.  We had gene testing results shows Lexapro, amitriptyline, Wellbutrin, Viibryd, Pristiq had good selections.  No history of suicidal attempt or any psychiatric inpatient treatment.  Past Medical History:  Past Medical History:  Diagnosis Date  . Acne   . ADHD (attention deficit hyperactivity disorder)   . Allergy   . Anxiety   . Arthritis   . Asthma   . Biliary dyskinesia 11/2013   symptomatic  . Chronic constipation   . Closed head injury 2012  . Cluster headaches   . Complication of anesthesia    wakes up combative  . Delayed gastric emptying   . Depression   . GERD (gastroesophageal reflux disease)   . Heart murmur   . Muscle spasms of neck    receives PT; needs to have neck support during surgery, per mother  . Pulmonary artery stenosis    left  . Sleep apnea    does not use a cpap    Past Surgical History:  Procedure Laterality Date  . [place] STUDY N/A 07/26/2015   Procedure: 24  HOUR PH STUDY;  Surgeon: Manus Gunning, MD;  Location: [facility];  Service: Gastroenterology;  Laterality: N/A;  . CHOLECYSTECTOMY N/A 12/09/2013   Procedure: LAPAROSCOPIC CHOLECYSTECTOMY WITH INTRAOPERATIVE CHOLANGIOGRAM;  Surgeon: Gwenyth Ober, MD;  Location: [facility];  Service: General;  Laterality: N/A;  . ESOPHAGEAL MANOMETRY N/A 07/26/2015   Procedure: ESOPHAGEAL MANOMETRY (EM);  Surgeon: Manus Gunning, MD;  Location: [facility];  Service: Gastroenterology;  Laterality: N/A;  . ESOPHAGOGASTRODUODENOSCOPY (EGD) WITH PROPOFOL  07/26/2012  . HYMENECTOMY  12/21/2006  . LAPAROSCOPIC APPENDECTOMY N/A 09/28/2013   Procedure: APPENDECTOMY LAPAROSCOPIC;  Surgeon: Gwenyth Ober,  MD;  Location: [facility];  Service: General;  Laterality: N/A;  . LAPAROSCOPIC CHOLECYSTECTOMY  12/09/2013  . NASAL SEPTUM SURGERY  2009   x 2  . ROBOTIC ASSISTED LAPAROSCOPIC OVARIAN CYSTECTOMY Right 01/20/2011  . TONSILLECTOMY AND ADENOIDECTOMY  1999    Family Psychiatric History: Reviewed.  Family History:  Family History  Problem Relation Age of Onset  . Colon cancer Maternal Grandmother   . Other Maternal Grandfather        pituitary tumor  . Diabetes Paternal Grandmother   . Heart Problems Paternal Grandfather        A fib, CHF  . Colon polyps Neg Hx   . Esophageal cancer Neg Hx   . Rectal cancer Neg Hx   . Stomach cancer Neg Hx     Social History:  Social History   Socioeconomic History  . Marital status: Single    Spouse name: Not on file  . Number of children: 0  . Years of education: Soph[employer]  . Highest education level: Not on file  Occupational History  . Occupation: student    Comment: [employer]  Social Needs  . Financial resource strain: Not on file  . Food insecurity:    Worry: Not on file    Inability: Not on file  . Transportation needs:    Medical: Not on file    Non-medical: Not on file  Tobacco Use  . Smoking status: Never Smoker  . Smokeless tobacco: Never Used  Substance and Sexual Activity  . Alcohol use: No  . Drug use: No  . Sexual activity: Never    Birth control/protection: None  Lifestyle  . Physical activity:    Days per week: Not on file    Minutes per session: Not on file  . Stress: Not on file  Relationships  . Social connections:    Talks on phone: Not on file    Gets together: Not on file    Attends religious service: Not on file    Active member of club or organization: Not on file    Attends meetings of clubs or organizations: Not on file    Relationship status: Not on file  Other Topics Concern  . Not on file  Social History Narrative   Pt lives at home with family.   Caffeine Use: very little    Allergies:  Allergies   Allergen Reactions  . Cyanoacrylate Rash    Component of dermabond  . Clavulanic Acid Nausea And Vomiting    Pt can have penicillins  . Compazine [Prochlorperazine Edisylate] Other (See Comments)    COMBATIVE AND BELLIGERENT  . Imipramine Rash  . Ketamine Rash and Other (See Comments)    HALLUCINATIONS AND COMBATIVE  . Prednisone Other (See Comments)    Allergy tests confirmed affects skin and mood.   . Topamax [Topiramate] Hives and Rash    COMBATIVE  .  Haldol [Haloperidol] Other (See Comments)    Becomes combative and beligerent  . Adhesive [Tape] Rash    BLISTERS  . Flagyl [Metronidazole] Rash  . Other Rash    Monocryl    Metabolic Disorder Labs: No results found for: HGBA1C, MPG No results found for: PROLACTIN No results found for: CHOL, TRIG, HDL, CHOLHDL, VLDL, LDLCALC No results found for: TSH  Therapeutic Level Labs: No results found for: LITHIUM No results found for: VALPROATE No components found for:  CBMZ  Current Medications: Current Outpatient Medications  Medication Sig Dispense Refill  . cetirizine (ZYRTEC) 10 MG tablet Take 10 mg by mouth at bedtime.     Marland Kitchen EPINEPHrine (EPIPEN 2-PAK) 0.3 mg/0.3 mL IJ SOAJ injection Inject 0.3 mLs (0.3 mg total) into the muscle as needed. 2 Device 1  . Fremanezumab-vfrm (AJOVY) 225 MG/1.5ML SOSY Inject into the skin every 30 (thirty) days.    . naratriptan (AMERGE) 2.5 MG tablet 2.5 mg at onset of headache, may repeat in 4 hours if needed    . polyethylene glycol powder (GLYCOLAX/MIRALAX) powder Take 1 Container by mouth once.    . traZODone (DESYREL) 50 MG tablet Take 1 tablet (50 mg total) by mouth at bedtime. 90 tablet 0  . Dexlansoprazole (DEXILANT) 30 MG capsule Take 1 capsule (30 mg total) by mouth daily. (Patient not taking: Reported on 05/13/2018) 90 capsule 3  . famotidine (PEPCID) 20 MG tablet Take 1-2 tablets (20-40 mg total) by mouth 2 (two) times daily as needed for heartburn or indigestion. (Patient not  taking: Reported on 04/16/2018) 90 tablet 3  . guanFACINE (INTUNIV) 1 MG TB24 ER tablet Take 1 mg by mouth daily. One tab in am for 1 week and then 2 tab in am    . ketorolac (TORADOL) 30 MG/ML injection Inject 30mg  (78ml) once every 12 hours as needed for severe headache    . lamoTRIgine (LAMICTAL) 200 MG tablet TK 1 T PO QD  6  . magnesium oxide (MAG-OX) 400 MG tablet Take by mouth.    . metoCLOPramide (REGLAN) 5 MG tablet Take 1/2 to 1 tablet for severe headache with benadryl    . ondansetron (ZOFRAN) 4 MG tablet Take by mouth.     No current facility-administered medications for this visit.      Musculoskeletal: Strength & Muscle Tone: within normal limits Gait & Station: normal Patient leans: N/A  Psychiatric Specialty Exam: ROS  Height 5\' 7"  (1.702 m), weight 176 lb (79.8 kg), last menstrual period 04/26/2018.Body mass index is 27.57 kg/m.  General Appearance: Casual  Eye Contact:  Good  Speech:  Clear and Coherent  Volume:  Normal  Mood:  Anxious  Affect:  Congruent  Thought Process:  Goal Directed  Orientation:  Full (Time, Place, and Person)  Thought Content: Logical   Suicidal Thoughts:  No  Homicidal Thoughts:  No  Memory:  Immediate;   Good Recent;   Good Remote;   Good  Judgement:  Good  Insight:  Good  Psychomotor Activity:  Normal  Concentration:  Concentration: Good and Attention Span: Good  Recall:  Good  Fund of Knowledge: Good  Language: Good  Akathisia:  No  Handed:  Right  AIMS (if indicated): not done  Assets:  Communication Skills Desire for Improvement Housing Resilience Social Support  ADL's:  Intact  Cognition: WNL  Sleep:  Fair   Screenings:   Assessment and Plan: Major depressive disorder, recurrent.  Generalized anxiety disorder.  Attention deficit disorder, inattentive type.  Sleep apnea.  She tried Intuniv with limited outcome.  She is no longer taking it.  I will discontinue Intuniv.  We talked about to continue try CPAP  machine as it eventually help her sleep apnea.  She promised to do that.  I will also increase trazodone 100 mg since she is tolerating trazodone well.  I encouraged to continue Viibryd since it is helping her depression however reminded that some time psychiatric medication does cause side effects.  If she can handle the side effects then she should continue Viibryd.  We have [facility] testing results and most of the medication she tried did cause side effects.  I will provide samples of Viibryd 10 mg if she ever decided to stop then she should take 10 mg Viibryd for 2 weeks and then stop.  We discussed withdrawal symptoms and relapse symptoms of depression from reducing Viibryd.  I recommended to call us back if she is any question or any concern.  Encouraged to keep appointment with Angela [name].  Follow-up in 3 months.   Kathlee Nations, MD 05/13/2018, 10:15 AM

## 2018-05-14 ENCOUNTER — Ambulatory Visit (HOSPITAL_COMMUNITY): Payer: Self-pay | Admitting: Licensed Clinical Social Worker

## 2018-05-26 ENCOUNTER — Other Ambulatory Visit (HOSPITAL_COMMUNITY): Payer: Self-pay | Admitting: Psychiatry

## 2018-06-03 ENCOUNTER — Telehealth: Payer: Self-pay | Admitting: Gastroenterology

## 2018-06-03 NOTE — Telephone Encounter (Signed)
Called pt- she is to have Miralax prep as per her instructions- I e mailed pt a copy of her instructions - we discussed what she needs to go buy and when she get the  dulcolax she needs to take 4 as soon as she gets them, then follow the Miralax instructions for 5 pm today and Tuesday morning. Pt verbalized understanding - encouraged to call with questions

## 2018-06-03 NOTE — Telephone Encounter (Signed)
Patient calling again stating pharmacy does not have prep and her procedure is tomorrow. Pt had pv on 12.6.19.

## 2018-06-04 ENCOUNTER — Encounter (HOSPITAL_COMMUNITY): Payer: Self-pay | Admitting: *Deleted

## 2018-06-04 ENCOUNTER — Ambulatory Visit (HOSPITAL_COMMUNITY)

## 2018-06-04 ENCOUNTER — Ambulatory Visit (HOSPITAL_COMMUNITY): Admitting: Anesthesiology

## 2018-06-04 ENCOUNTER — Encounter (HOSPITAL_COMMUNITY)
Admission: RE | Disposition: A | Discharge status: Home / Self Care | Payer: Self-pay | Source: Home / Self Care | Attending: Family Medicine

## 2018-06-04 ENCOUNTER — Other Ambulatory Visit: Payer: Self-pay

## 2018-06-04 ENCOUNTER — Observation Stay (HOSPITAL_COMMUNITY)
Admission: RE | Admit: 2018-06-04 | Discharge: 2018-06-05 | Disposition: A | Discharge status: Home / Self Care | Attending: Gastroenterology | Admitting: Gastroenterology

## 2018-06-04 DIAGNOSIS — G43809 Other migraine, not intractable, without status migrainosus: Secondary | ICD-10-CM

## 2018-06-04 DIAGNOSIS — F329 Major depressive disorder, single episode, unspecified: Secondary | ICD-10-CM | POA: Diagnosis not present

## 2018-06-04 DIAGNOSIS — G4730 Sleep apnea, unspecified: Secondary | ICD-10-CM | POA: Diagnosis not present

## 2018-06-04 DIAGNOSIS — Z881 Allergy status to other antibiotic agents status: Secondary | ICD-10-CM | POA: Diagnosis not present

## 2018-06-04 DIAGNOSIS — R011 Cardiac murmur, unspecified: Secondary | ICD-10-CM | POA: Diagnosis not present

## 2018-06-04 DIAGNOSIS — F339 Major depressive disorder, recurrent, unspecified: Secondary | ICD-10-CM | POA: Diagnosis present

## 2018-06-04 DIAGNOSIS — G43909 Migraine, unspecified, not intractable, without status migrainosus: Secondary | ICD-10-CM | POA: Insufficient documentation

## 2018-06-04 DIAGNOSIS — Z79899 Other long term (current) drug therapy: Secondary | ICD-10-CM | POA: Insufficient documentation

## 2018-06-04 DIAGNOSIS — M1990 Unspecified osteoarthritis, unspecified site: Secondary | ICD-10-CM | POA: Diagnosis not present

## 2018-06-04 DIAGNOSIS — Z800 Family history of malignant neoplasm of digestive organs: Secondary | ICD-10-CM | POA: Insufficient documentation

## 2018-06-04 DIAGNOSIS — K5909 Other constipation: Secondary | ICD-10-CM | POA: Diagnosis not present

## 2018-06-04 DIAGNOSIS — F411 Generalized anxiety disorder: Secondary | ICD-10-CM | POA: Diagnosis not present

## 2018-06-04 DIAGNOSIS — J45909 Unspecified asthma, uncomplicated: Secondary | ICD-10-CM | POA: Diagnosis not present

## 2018-06-04 DIAGNOSIS — R1032 Left lower quadrant pain: Secondary | ICD-10-CM | POA: Diagnosis not present

## 2018-06-04 DIAGNOSIS — Z9049 Acquired absence of other specified parts of digestive tract: Secondary | ICD-10-CM | POA: Diagnosis not present

## 2018-06-04 DIAGNOSIS — R109 Unspecified abdominal pain: Secondary | ICD-10-CM | POA: Diagnosis present

## 2018-06-04 DIAGNOSIS — F902 Attention-deficit hyperactivity disorder, combined type: Secondary | ICD-10-CM | POA: Diagnosis not present

## 2018-06-04 DIAGNOSIS — Z888 Allergy status to other drugs, medicaments and biological substances status: Secondary | ICD-10-CM | POA: Insufficient documentation

## 2018-06-04 DIAGNOSIS — D175 Benign lipomatous neoplasm of intra-abdominal organs: Secondary | ICD-10-CM | POA: Diagnosis present

## 2018-06-04 DIAGNOSIS — F909 Attention-deficit hyperactivity disorder, unspecified type: Secondary | ICD-10-CM | POA: Diagnosis not present

## 2018-06-04 DIAGNOSIS — K30 Functional dyspepsia: Secondary | ICD-10-CM | POA: Diagnosis not present

## 2018-06-04 DIAGNOSIS — K219 Gastro-esophageal reflux disease without esophagitis: Secondary | ICD-10-CM | POA: Insufficient documentation

## 2018-06-04 DIAGNOSIS — Z833 Family history of diabetes mellitus: Secondary | ICD-10-CM | POA: Diagnosis not present

## 2018-06-04 DIAGNOSIS — Z8249 Family history of ischemic heart disease and other diseases of the circulatory system: Secondary | ICD-10-CM | POA: Insufficient documentation

## 2018-06-04 DIAGNOSIS — Z885 Allergy status to narcotic agent status: Secondary | ICD-10-CM | POA: Insufficient documentation

## 2018-06-04 DIAGNOSIS — Q256 Stenosis of pulmonary artery: Secondary | ICD-10-CM | POA: Diagnosis not present

## 2018-06-04 HISTORY — PX: FLEXIBLE SIGMOIDOSCOPY: SHX5431

## 2018-06-04 HISTORY — PX: BIOPSY: SHX5522

## 2018-06-04 LAB — PREGNANCY, URINE: Preg Test, Ur: NEGATIVE

## 2018-06-04 SURGERY — SCLEROTHERAPY
Anesthesia: Monitor Anesthesia Care

## 2018-06-04 MED ORDER — PROPOFOL 10 MG/ML IV BOLUS
INTRAVENOUS | Status: AC
  Filled 2018-06-04: qty 60

## 2018-06-04 MED ORDER — KETOROLAC TROMETHAMINE 30 MG/ML IJ SOLN
30.0000 mg | Freq: Four times a day (QID) | INTRAMUSCULAR | Status: DC | PRN
  Administered 2018-06-04: 30 mg via INTRAVENOUS
  Filled 2018-06-04: qty 1

## 2018-06-04 MED ORDER — HYDROMORPHONE HCL 1 MG/ML IJ SOLN
INTRAMUSCULAR | Status: AC
  Filled 2018-06-04: qty 1

## 2018-06-04 MED ORDER — NON FORMULARY: 10.0000 mg | Freq: Every day | Status: DC

## 2018-06-04 MED ORDER — SODIUM CHLORIDE (PF) 0.9 % IJ SOLN: 500.0000 mL | Freq: Once | INTRAMUSCULAR | Status: DC

## 2018-06-04 MED ORDER — ONDANSETRON HCL 4 MG/2ML IJ SOLN
4.0000 mg | Freq: Once | INTRAMUSCULAR | Status: AC
  Administered 2018-06-04: 4 mg via INTRAVENOUS

## 2018-06-04 MED ORDER — PROPOFOL 10 MG/ML IV BOLUS
INTRAVENOUS | Status: DC | PRN
  Administered 2018-06-04 (×2): 20 mg via INTRAVENOUS

## 2018-06-04 MED ORDER — SODIUM CHLORIDE 0.9 % IV SOLN: Freq: Once | INTRAVENOUS | Status: DC

## 2018-06-04 MED ORDER — HYDROXYZINE HCL 25 MG PO TABS
25.0000 mg | ORAL_TABLET | Freq: Three times a day (TID) | ORAL | Status: DC | PRN
  Administered 2018-06-04: 25 mg via ORAL
  Filled 2018-06-04: qty 1

## 2018-06-04 MED ORDER — ONDANSETRON HCL 4 MG/2ML IJ SOLN
INTRAMUSCULAR | Status: AC
  Filled 2018-06-04: qty 2

## 2018-06-04 MED ORDER — PROPOFOL 500 MG/50ML IV EMUL
INTRAVENOUS | Status: DC | PRN
  Administered 2018-06-04: 125 ug/kg/min via INTRAVENOUS

## 2018-06-04 MED ORDER — DIPHENHYDRAMINE HCL 50 MG/ML IJ SOLN
25.0000 mg | Freq: Once | INTRAMUSCULAR | Status: AC
  Administered 2018-06-04: 25 mg via INTRAVENOUS

## 2018-06-04 MED ORDER — EPINEPHRINE PF 1 MG/10ML IJ SOSY
PREFILLED_SYRINGE | INTRAMUSCULAR | Status: AC
  Filled 2018-06-04: qty 10

## 2018-06-04 MED ORDER — SODIUM CHLORIDE 0.9 % IV SOLN
INTRAVENOUS | Status: DC
  Administered 2018-06-04: 500 mL via INTRAVENOUS

## 2018-06-04 MED ORDER — ACETAMINOPHEN 650 MG RE SUPP: 650.0000 mg | Freq: Four times a day (QID) | RECTAL | Status: DC | PRN

## 2018-06-04 MED ORDER — KETOROLAC TROMETHAMINE 30 MG/ML IJ SOLN
30.0000 mg | Freq: Once | INTRAMUSCULAR | Status: AC
  Administered 2018-06-04: 30 mg via INTRAVENOUS
  Filled 2018-06-04: qty 1

## 2018-06-04 MED ORDER — LACTATED RINGERS IV SOLN
INTRAVENOUS | Status: DC | PRN
  Administered 2018-06-04: 11:00:00 via INTRAVENOUS

## 2018-06-04 MED ORDER — OXYCODONE HCL 5 MG PO TABS: 5.0000 mg | ORAL_TABLET | ORAL | Status: DC | PRN

## 2018-06-04 MED ORDER — IOHEXOL 300 MG/ML  SOLN
100.0000 mL | Freq: Once | INTRAMUSCULAR | Status: AC | PRN
  Administered 2018-06-04: 100 mL via INTRAVENOUS

## 2018-06-04 MED ORDER — HYDROMORPHONE HCL 1 MG/ML IJ SOLN
1.0000 mg | Freq: Once | INTRAMUSCULAR | Status: AC
  Administered 2018-06-04: 1 mg via INTRAVENOUS

## 2018-06-04 MED ORDER — SODIUM CHLORIDE (PF) 0.9 % IJ SOLN
INTRAMUSCULAR | Status: DC | PRN
  Administered 2018-06-04: 7.5 mL via INTRAVENOUS

## 2018-06-04 MED ORDER — ACETAMINOPHEN 325 MG PO TABS: 650.0000 mg | ORAL_TABLET | Freq: Four times a day (QID) | ORAL | Status: DC | PRN

## 2018-06-04 MED ORDER — DIPHENHYDRAMINE HCL 50 MG/ML IJ SOLN
INTRAMUSCULAR | Status: AC
  Filled 2018-06-04: qty 1

## 2018-06-04 MED ORDER — TRAZODONE HCL 50 MG PO TABS
100.0000 mg | ORAL_TABLET | Freq: Every evening | ORAL | Status: DC | PRN
  Administered 2018-06-04: 100 mg via ORAL
  Filled 2018-06-04: qty 2

## 2018-06-04 MED ORDER — PANTOPRAZOLE SODIUM 40 MG PO TBEC
40.0000 mg | DELAYED_RELEASE_TABLET | Freq: Every day | ORAL | Status: DC
  Administered 2018-06-05: 40 mg via ORAL
  Filled 2018-06-04: qty 1

## 2018-06-04 MED ORDER — LAMOTRIGINE 100 MG PO TABS
100.0000 mg | ORAL_TABLET | Freq: Every day | ORAL | Status: DC
  Administered 2018-06-04: 100 mg via ORAL
  Filled 2018-06-04: qty 1

## 2018-06-04 MED ORDER — SODIUM CHLORIDE (PF) 0.9 % IJ SOLN
INTRAMUSCULAR | Status: AC
  Filled 2018-06-04: qty 50

## 2018-06-04 MED ORDER — DIPHENHYDRAMINE HCL 50 MG/ML IJ SOLN: 25.0000 mg | Freq: Three times a day (TID) | INTRAMUSCULAR | Status: DC | PRN

## 2018-06-04 MED ORDER — ONDANSETRON HCL 4 MG/2ML IJ SOLN: 4.0000 mg | Freq: Four times a day (QID) | INTRAMUSCULAR | Status: DC | PRN

## 2018-06-04 MED ORDER — ONDANSETRON HCL 4 MG PO TABS: 4.0000 mg | ORAL_TABLET | Freq: Four times a day (QID) | ORAL | Status: DC | PRN

## 2018-06-04 MED ORDER — SODIUM CHLORIDE (PF) 0.9 % IJ SOLN
PREFILLED_SYRINGE | INTRAMUSCULAR | Status: DC | PRN
  Administered 2018-06-04: 7.5 mL

## 2018-06-04 MED ORDER — VILAZODONE HCL 20 MG PO TABS
20.0000 mg | ORAL_TABLET | Freq: Every day | ORAL | Status: DC
  Filled 2018-06-04: qty 1

## 2018-06-04 MED ORDER — CETIRIZINE HCL 10 MG PO TABS
10.0000 mg | ORAL_TABLET | Freq: Every day | ORAL | Status: DC
  Administered 2018-06-04: 10 mg via ORAL
  Filled 2018-06-04 (×2): qty 1

## 2018-06-04 SURGICAL SUPPLY — 22 items

## 2018-06-04 NOTE — Progress Notes (Signed)
Patient continues to have lower abdominal pain. Abdominal xray shows no free air. Her abdomen remains soft, vitals remain normal. Again suspicion for perforation or significant bowel injury remains low, although if she does not improve will proceed with CT scan. Will give more antiemetics and pain control with dilauded. Hopefully with more time this resolves if due to epinephrine, if not she may need to be admitted for observation and further pain control. Spoke at length with patient and mother who agreed.

## 2018-06-04 NOTE — Anesthesia Preprocedure Evaluation (Addendum)
Anesthesia Evaluation  Patient identified by MRN, date of birth, ID band Patient awake    Reviewed: Allergy & Precautions, NPO status , Patient's Chart, lab work & pertinent test results  History of Anesthesia Complications Negative for: history of anesthetic complications  Airway Mallampati: I  TM Distance: >3 FB Neck ROM: Full    Dental  (+) Dental Advisory Given   Pulmonary neg pulmonary ROS, neg recent URI,    breath sounds clear to auscultation       Cardiovascular negative cardio ROS   Rhythm:Regular Rate:Normal     Neuro/Psych  Headaches, PSYCHIATRIC DISORDERS (ADHD) Anxiety Depression    GI/Hepatic Neg liver ROS, GERD  Medicated and Controlled,  Endo/Other  negative endocrine ROS  Renal/GU negative Renal ROS     Musculoskeletal   Abdominal   Peds  Hematology negative hematology ROS (+)   Anesthesia Other Findings   Reproductive/Obstetrics                            Anesthesia Physical Anesthesia Plan  ASA: II  Anesthesia Plan: MAC   Post-op Pain Management:    Induction:   PONV Risk Score and Plan: 2 and Treatment may vary due to age or medical condition  Airway Management Planned: Natural Airway and Nasal Cannula  Additional Equipment:   Intra-op Plan:   Post-operative Plan:   Informed Consent: I have reviewed the patients History and Physical, chart, labs and discussed the procedure including the risks, benefits and alternatives for the proposed anesthesia with the patient or authorized representative who has indicated his/her understanding and acceptance.   Dental advisory given  Plan Discussed with: CRNA and Surgeon  Anesthesia Plan Comments:         Anesthesia Quick Evaluation

## 2018-06-04 NOTE — H&P (Signed)
History and Physical  Patient Name: Angela [name]     IFO:[id number]    DOB: 1995/07/18    DOA: 06/04/2018 PCP: Tomasa Hose, NP   Patient coming from: Endoscopy suite  Chief Complaint: Abdominal pain post colonoscopy  HPI: Angela [name] is a 23 y.o. F with hx intractable migraines, generalized anxiety, and ADD who presented for lipoma removal by endsocopy due to persistent pain, bleeding, and bowel symtoms from the lipoma.  The procedure was unremarkable, but post-procedure, the patient had abnormally severe colicky abdomeinal pain.  It is sharp, constant with colicky spasms.  It is not associated with rectal bleeding, sweats, fever/chills, vomiting.  She had a radiograph abdomen that was unremarkable, she had a CT abdomen that was normal, in particular showed no colitis, colon rupture or pneumoperitoneum. She was given hydromorphone 1 mg x5 and had itching but still was in severe lower abdominal pain but had normal hemodynamic parameters.  The hospitalist service were asked to observe overnight due to abnormally severe abdominal pain post-procedure.       Review of Systems:  Review of Systems  Constitutional: Negative for chills and fever.  Respiratory: Negative for shortness of breath.   Cardiovascular: Negative for chest pain.  Gastrointestinal: Positive for abdominal pain.  Musculoskeletal: Negative for back pain.  All other systems reviewed and are negative.       Past Medical History:  Diagnosis Date  . Acne   . ADHD (attention deficit hyperactivity disorder)   . Allergy   . Anxiety   . Arthritis   . Asthma   . Biliary dyskinesia 11/2013   symptomatic  . Chronic constipation   . Closed head injury 2012  . Cluster headaches   . Complication of anesthesia    wakes up combative  . Delayed gastric emptying   . Depression   . GERD (gastroesophageal reflux disease)   . Heart murmur   . Muscle spasms of neck    receives PT; needs to have neck support during  surgery, per mother  . Pulmonary artery stenosis    left  . Sleep apnea    does not use a cpap    Past Surgical History:  Procedure Laterality Date  . [place] STUDY N/A 07/26/2015   Procedure: [place] STUDY;  Surgeon: Manus Gunning, MD;  Location: [facility];  Service: Gastroenterology;  Laterality: N/A;  . CHOLECYSTECTOMY N/A 12/09/2013   Procedure: LAPAROSCOPIC CHOLECYSTECTOMY WITH INTRAOPERATIVE CHOLANGIOGRAM;  Surgeon: Gwenyth Ober, MD;  Location: [place];  Service: General;  Laterality: N/A;  . ESOPHAGEAL MANOMETRY N/A 07/26/2015   Procedure: ESOPHAGEAL MANOMETRY (EM);  Surgeon: Manus Gunning, MD;  Location: [facility];  Service: Gastroenterology;  Laterality: N/A;  . ESOPHAGOGASTRODUODENOSCOPY (EGD) WITH PROPOFOL  07/26/2012  . HYMENECTOMY  12/21/2006  . LAPAROSCOPIC APPENDECTOMY N/A 09/28/2013   Procedure: APPENDECTOMY LAPAROSCOPIC;  Surgeon: Gwenyth Ober, MD;  Location: [place];  Service: General;  Laterality: N/A;  . LAPAROSCOPIC CHOLECYSTECTOMY  12/09/2013  . NASAL SEPTUM SURGERY  2009   x 2  . ROBOTIC ASSISTED LAPAROSCOPIC OVARIAN CYSTECTOMY Right 01/20/2011  . TONSILLECTOMY AND ADENOIDECTOMY  1999    Social History: Patient lives with her family.  Patient walks unassisted. She   reports that she has never smoked. She has never used smokeless tobacco. She reports that she does not drink alcohol or use drugs.  Allergies  Allergen Reactions  . Cyanoacrylate Rash    Component of dermabond  . Clavulanic  Acid Nausea And Vomiting    Pt can have penicillins  . Compazine [Prochlorperazine Edisylate] Other (See Comments)    COMBATIVE AND BELLIGERENT  . Imipramine Rash  . Ketamine Rash and Other (See Comments)    HALLUCINATIONS AND COMBATIVE  . Prednisone Other (See Comments)    Allergy tests confirmed affects skin and mood.   . Topamax [Topiramate] Hives and Rash    COMBATIVE  . Haldol [Haloperidol] Other (See Comments)    Becomes combative and  beligerent  . Adhesive [Tape] Rash    BLISTERS  . Flagyl [Metronidazole] Rash  . Other Rash    Monocryl    Family history: family history includes Colon cancer in her maternal grandmother; Diabetes in her paternal grandmother; Heart Problems in her paternal grandfather; Other in her maternal grandfather.  Prior to Admission medications   Medication Sig Start Date End Date Taking? Authorizing Provider  cetirizine (ZYRTEC) 10 MG tablet Take 10 mg by mouth at bedtime.    Yes [provider]  dexlansoprazole (DEXILANT) 60 MG capsule Take 60 mg by mouth daily before breakfast.   Yes [provider]  EPINEPHrine (EPIPEN 2-PAK) 0.3 mg/0.3 mL IJ SOAJ injection Inject 0.3 mLs (0.3 mg total) into the muscle as needed. Patient taking differently: Inject 0.3 mg into the muscle as needed (severe allergic reaction).  06/01/17  Yes Padgett, Rae Halsted, MD  lamoTRIgine (LAMICTAL) 100 MG tablet Take 100 mg by mouth at bedtime.   Yes [provider]  magnesium oxide (MAG-OX) 400 MG tablet Take 400 mg by mouth daily.    Yes [provider]  naratriptan (AMERGE) 2.5 MG tablet Take 2.5 mg by mouth every 4 (four) hours as needed for migraine.  06/07/17  Yes [provider]  ondansetron (ZOFRAN) 4 MG tablet Take 4 mg by mouth every 8 (eight) hours as needed for nausea.  12/10/13  Yes [provider]  tiZANidine (ZANAFLEX) 4 MG tablet Take 4 mg by mouth every 6 (six) hours as needed for muscle spasms.   Yes [provider]  traZODone (DESYREL) 100 MG tablet Take 1 tablet (100 mg total) by mouth at bedtime. Patient taking differently: Take 100 mg by mouth at bedtime as needed for sleep.  05/13/18  Yes Arfeen, Arlyce Harman, MD  Vilazodone HCl (VIIBRYD) 20 MG TABS Take 1 tablet (20 mg total) by mouth daily. 05/13/18  Yes Arfeen, Arlyce Harman, MD  Fremanezumab-vfrm (AJOVY) 225 MG/1.5ML SOSY Inject 225 mg into the skin every 30 (thirty) days.     [provider]       Physical Exam: BP 113/75 (BP Location: Left Arm)   Pulse 98   Temp 97.8 F (36.6 C) (Oral)   Resp 18   Ht 5\' 7"  (1.702 m)   Wt 81.6 kg   SpO2 96%   BMI 28.19 kg/m  General appearance: Well-developed, adult female, alert and in moderate distress from persistent pain.   Eyes: Anicteric, conjunctiva pink, lids and lashes normal. PERRL.    ENT: No nasal deformity, discharge, epistaxis.  Hearing normal.  Skin: Warm and dry.  No jaundice.    Cardiac: RRR, nl S1-S2, no murmurs appreciated.  Capillary refill is brisk.  JVP not visible.  No LE edema.  Radial pulses 2+ and symmetric. Respiratory: Normal respiratory rate and rhythm.  CTAB without rales or wheezes. Abdomen: Abdomen soft.  Moderate LLQ TTP with voluntary guarding, no rebound or rigidity. No ascites, distension, hepatosplenomegaly.   MSK: No deformities or  effusions.  No cyanosis or clubbing. Neuro: Speech is fluent.  Muscle strength normal.    Psych: Sensorium intact and responding to questions, attention normal.  Affect blunted by pain.  Judgment and insight appear normal.      Radiological Exams on Admission: Personally reviewed CT report: Ct Abdomen Pelvis W Contrast  Result Date: 06/04/2018 CLINICAL DATA:  Severe abdominal pain. Postop from colonoscopic resection of large sigmoid colon lipoma earlier today. EXAM: CT ABDOMEN AND PELVIS WITH CONTRAST TECHNIQUE: Multidetector CT imaging of the abdomen and pelvis was performed using the standard protocol following bolus administration of intravenous contrast. CONTRAST:  148mL OMNIPAQUE IOHEXOL 300 MG/ML  SOLN COMPARISON:  10/06/2013 FINDINGS: Lower Chest: No acute findings. Hepatobiliary: No hepatic masses identified. Prior cholecystectomy. No evidence of biliary obstruction. Pancreas:  No mass or inflammatory changes. Spleen: Within normal limits in size and appearance. Adrenals/Urinary Tract: No masses identified. No evidence of hydronephrosis. Stomach/Bowel: No  evidence of obstruction, inflammatory process or abnormal fluid collections. Clips are seen in the sigmoid colon, however there is no evidence of wall thickening, extraluminal air or free intraperitoneal air. Vascular/Lymphatic: No pathologically enlarged lymph nodes. No abdominal aortic aneurysm. Reproductive:  No mass or other significant abnormality. Other:  None. Musculoskeletal: No suspicious bone lesions identified. Bilateral L5 pars defects incidentally noted, without associated spondylolisthesis. IMPRESSION: No complications or other acute findings identified. Bilateral L5 pars defects incidentally noted. Electronically Signed   By: Earle Gell M.D.   On: 06/04/2018 14:50   Dg Abd Portable 1v  Result Date: 06/04/2018 CLINICAL DATA:  Mid abdominal pain. EXAM: PORTABLE ABDOMEN - 1 VIEW COMPARISON:  03/08/2016. FINDINGS: Surgical clips right upper quadrant. Clips also noted the left lower quadrant. Surgical sutures noted over the right abdomen. No bowel distention. No free air. No acute intra-abdominal abnormality identified. Pelvic calcifications consistent phleboliths. Lung bases are clear. No acute bony abnormality. IMPRESSION: No acute abnormality. Electronically Signed   By: Marcello Moores  Register   On: 06/04/2018 13:08        Assessment/Plan Abdominal pain   Pain is lessening.  Stop Dilaudid given itching. -Oxycodone, acetaminophen or Toradol for pain   Itching  -Trial hydroxyzine  Depression  -Continue Viibryd -Continue trazodone  Migraines  -Continue Lamictal      DVT prophylaxis: None low risk Code Status: OBS  Family Communication: Mother  Disposition Plan: Anticipate observation [place].  If no new fever, bleeding, and if pain improved in AM, home with ibuprofen and GI follow up Consults called: Discussed with GI Admission status: OBS    At the point of initial evaluation, it is my clinical opinion that admission for OBSERVATION is reasonable and necessary because  the patient's presenting complaints in the context of their chronic conditions represent sufficient risk of deterioration or significant morbidity to constitute reasonable grounds for close observation in the hospital setting, but that the patient may be medically stable for discharge from the hospital within 24 to 48 hours.     Medical decision making: Patient seen at 6:05 PM on 06/04/2018.  The patient was discussed with Ellouise Newer, PA_C.  What exists of the patient's chart was reviewed in depth and summarized above.  Clinical condition: stable.          Edwin Dada [facility] Pager [phone]

## 2018-06-04 NOTE — Discharge Instructions (Signed)

## 2018-06-04 NOTE — Op Note (Addendum)
[facility] Patient Name: Angela [name] Procedure Date: 06/04/2018 MRN: [id number] Attending MD: Carlota Raspberry. Havery Moros , MD Date of Birth: 1995-12-21 CSN: [id number] Age: 23 Admit Type: Outpatient Procedure:                Flexible Sigmoidoscopy Indications:              For therapy of large sigmoid lipoma in the setting                            of bowel symptoms / pain / bleeding Providers:                Remo Lipps P. Havery Moros, MD, Vista Lawman, RN, Cherylynn Ridges, Technician, Derrek Gu. Alday CRNA, CRNA,                            Courtney Heys. Armistead, CRNA Referring MD:              Medicines:                Monitored Anesthesia Care Complications:            No immediate complications. Estimated blood loss:                            Minimal. Estimated Blood Loss:     Estimated blood loss was minimal. Procedure:                Pre-Anesthesia Assessment:                           - Prior to the procedure, a History and Physical                            was performed, and patient medications and                            allergies were reviewed. The patient's tolerance of                            previous anesthesia was also reviewed. The risks                            and benefits of the procedure and the sedation                            options and risks were discussed with the patient.                            All questions were answered, and informed consent                            was obtained. Prior Anticoagulants: The patient has  taken no previous anticoagulant or antiplatelet                            agents. ASA Grade Assessment: II - A patient with                            mild systemic disease. After reviewing the risks                            and benefits, the patient was deemed in                            satisfactory condition to undergo the procedure.  After obtaining informed consent, the scope was                            passed under direct vision. The PCF-H190DL                            ([id number] Olympus peds colonoscope was introduced                            through the anus and advanced to the the descending                            colon. After obtaining informed consent, the scope                            was passed under direct vision.The flexible                            sigmoidoscopy was accomplished without difficulty.                            The patient tolerated the procedure well. The                            quality of the bowel preparation was adequate. Scope In: Scope Out: Findings:      The perianal and digital rectal examinations were normal.      There was a large lipoma, in the proximal sigmoid colon. The base of the       lesion, in 4 quadrants, was successfully injected with a mix of saline       and 1:10,000 solution of epinephrine for drug delivery. Good blanching       of the lesion and the mucosa surrounding the lesion was noted. The       lipoma was then removed with a snare using combination of forced       coagulation and then endocut setting. Resection and retrieval were       complete. The base of the lesion and subsequent polypectomy defect was       wide. To prevent bleeding after removal given increased risk for       bleeding with removal of large colonic lipomas, four hemostatic clips       (large 60m DuraClip) were  successfully placed which was able to bridge       the defect and mostly close it. Residual lipomatous tissue was       protruding from the base of the lesion.      The exam was otherwise normal throughout the examined colon. Impression:               - Large lipoma in the proximal sigmoid colon.                            Injected, removed, clips were placed as above Moderate Sedation:      No moderate sedation, case performed with MAC Recommendation:           -  Discharge patient to home.                           - Resume previous diet.                           - Await pathology results.                           - No ibuprofen, naproxen, or other non-steroidal                            anti-inflammatory drugs for 2 weeks Procedure Code(s):        --- Professional ---                           [phone], Sigmoidoscopy, flexible; with removal of                            tumor(s), polyp(s), or other lesion(s) by snare                            technique                           [id number], Sigmoidoscopy, flexible; with directed                            submucosal injection(s), any substance Diagnosis Code(s):        --- Professional ---                           D17.5, Benign lipomatous neoplasm of                            intra-abdominal organs                           K63.5, Polyp of colon CPT copyright 2018 American Medical Association. All rights reserved. The codes documented in this report are preliminary and upon coder review may  be revised to meet current compliance requirements. Remo Lipps P. Armbruster, MD 06/04/2018 12:01:02 PM This report has been signed electronically. Number of Addenda: 0

## 2018-06-04 NOTE — H&P (Signed)
HPI:   Angela [name] is a 23 y.o. female here for therapeutic colonoscopy, removal of large sigmoid lipoma noted on recent colonoscopy. She feels at baseline today, continues to have some altered bowel habits and abdominal pains at times. No new changes,   Past Medical History:  Diagnosis Date  . Acne   . ADHD (attention deficit hyperactivity disorder)   . Allergy   . Anxiety   . Arthritis   . Asthma   . Biliary dyskinesia 11/2013   symptomatic  . Chronic constipation   . Closed head injury 2012  . Cluster headaches   . Complication of anesthesia    wakes up combative  . Delayed gastric emptying   . Depression   . GERD (gastroesophageal reflux disease)   . Heart murmur   . Muscle spasms of neck    receives PT; needs to have neck support during surgery, per mother  . Pulmonary artery stenosis    left  . Sleep apnea    does not use a cpap    Past Surgical History:  Procedure Laterality Date  . [place] STUDY N/A 07/26/2015   Procedure: [place] STUDY;  Surgeon: Manus Gunning, MD;  Location: [facility];  Service: Gastroenterology;  Laterality: N/A;  . CHOLECYSTECTOMY N/A 12/09/2013   Procedure: LAPAROSCOPIC CHOLECYSTECTOMY WITH INTRAOPERATIVE CHOLANGIOGRAM;  Surgeon: Gwenyth Ober, MD;  Location: [place];  Service: General;  Laterality: N/A;  . ESOPHAGEAL MANOMETRY N/A 07/26/2015   Procedure: ESOPHAGEAL MANOMETRY (EM);  Surgeon: Manus Gunning, MD;  Location: [facility];  Service: Gastroenterology;  Laterality: N/A;  . ESOPHAGOGASTRODUODENOSCOPY (EGD) WITH PROPOFOL  07/26/2012  . HYMENECTOMY  12/21/2006  . LAPAROSCOPIC APPENDECTOMY N/A 09/28/2013   Procedure: APPENDECTOMY LAPAROSCOPIC;  Surgeon: Gwenyth Ober, MD;  Location: [place];  Service: General;  Laterality: N/A;  . LAPAROSCOPIC CHOLECYSTECTOMY  12/09/2013  . NASAL SEPTUM SURGERY  2009   x 2  . ROBOTIC ASSISTED LAPAROSCOPIC OVARIAN CYSTECTOMY Right 01/20/2011  . TONSILLECTOMY AND  ADENOIDECTOMY  1999    Family History  Problem Relation Age of Onset  . Colon cancer Maternal Grandmother   . Other Maternal Grandfather        pituitary tumor  . Diabetes Paternal Grandmother   . Heart Problems Paternal Grandfather        A fib, CHF  . Colon polyps Neg Hx   . Esophageal cancer Neg Hx   . Rectal cancer Neg Hx   . Stomach cancer Neg Hx      Social History   Tobacco Use  . Smoking status: Never Smoker  . Smokeless tobacco: Never Used  Substance Use Topics  . Alcohol use: No  . Drug use: No    Prior to Admission medications   Medication Sig Start Date End Date Taking? Authorizing Provider  cetirizine (ZYRTEC) 10 MG tablet Take 10 mg by mouth at bedtime.    Yes [provider]  dexlansoprazole (DEXILANT) 60 MG capsule Take 60 mg by mouth daily before breakfast.   Yes [provider]  EPINEPHrine (EPIPEN 2-PAK) 0.3 mg/0.3 mL IJ SOAJ injection Inject 0.3 mLs (0.3 mg total) into the muscle as needed. Patient taking differently: Inject 0.3 mg into the muscle as needed (severe allergic reaction).  06/01/17  Yes Padgett, Rae Halsted, MD  lamoTRIgine (LAMICTAL) 100 MG tablet Take 100 mg by mouth at bedtime.   Yes [provider]  magnesium oxide (MAG-OX) 400 MG tablet Take 400 mg by mouth daily.    Yes [provider]  naratriptan (AMERGE) 2.5 MG tablet Take 2.5 mg by mouth every 4 (four) hours as needed for migraine.  06/07/17  Yes [provider]  ondansetron (ZOFRAN) 4 MG tablet Take 4 mg by mouth every 8 (eight) hours as needed for nausea.  12/10/13  Yes [provider]  tiZANidine (ZANAFLEX) 4 MG tablet Take 4 mg by mouth every 6 (six) hours as needed for muscle spasms.   Yes [provider]  traZODone (DESYREL) 100 MG tablet Take 1 tablet (100 mg total) by mouth at bedtime. Patient taking differently: Take 100 mg by mouth at bedtime as needed for sleep.  05/13/18  Yes Arfeen, Arlyce Harman, MD  Vilazodone HCl  (VIIBRYD) 20 MG TABS Take 1 tablet (20 mg total) by mouth daily. 05/13/18  Yes Arfeen, Arlyce Harman, MD  Dexlansoprazole (DEXILANT) 30 MG capsule Take 1 capsule (30 mg total) by mouth daily. Patient not taking: Reported on 05/13/2018 04/11/18   Yetta Flock, MD  famotidine (PEPCID) 20 MG tablet Take 1-2 tablets (20-40 mg total) by mouth 2 (two) times daily as needed for heartburn or indigestion. Patient not taking: Reported on 04/16/2018 04/11/18   Armbruster, Carlota Raspberry, MD  Fremanezumab-vfrm (AJOVY) 225 MG/1.5ML SOSY Inject 225 mg into the skin every 30 (thirty) days.     [provider]    Current Facility-Administered Medications  Medication Dose Route Frequency Provider Last Rate Last Dose  . 0.9 %  sodium chloride infusion   Intravenous Continuous Armbruster, Carlota Raspberry, MD       Facility-Administered Medications Ordered in Other Encounters  Medication Dose Route Frequency Provider Last Rate Last Dose  . lactated ringers infusion    Continuous PRN Lind Covert, CRNA        Allergies as of 05/03/2018 - Review Complete 05/03/2018  Allergen Reaction Noted  . Cyanoacrylate Rash 12/18/2013  . Clavulanic acid Nausea And Vomiting 10/30/2013  . Compazine [prochlorperazine edisylate] Other (See Comments) 12/10/2011  . Imipramine Rash 01/03/2014  . Ketamine Rash and Other (See Comments) 01/06/2011  . Prednisone Other (See Comments) 10/12/2016  . Topamax [topiramate] Hives and Rash 12/10/2011  . Haldol [haloperidol] Other (See Comments) 08/08/2016  . Adhesive [tape] Rash 12/04/2013  . Flagyl [metronidazole] Rash 10/30/2013  . Other Rash 12/18/2013     Review of Systems:    As per HPI, otherwise negative    Physical Exam:  Vital signs in last 24 hours: Temp:  [97.9 F (36.6 C)] 97.9 F (36.6 C) (01/07 1037) Pulse Rate:  [74] 74 (01/07 1037) Resp:  [13] 13 (01/07 1037) BP: (111)/(72) 111/72 (01/07 1037) SpO2:  [100 %] 100 % (01/07 1037) Weight:  [81.6 kg] 81.6 kg  (01/07 1037)   General:   Pleasant female in NAD Lungs:  Respirations even and unlabored. Lungs clear to auscultation bilaterally.   No wheezes, crackles, or rhonchi.  Heart:  Regular rate and rhythm; no MRG Abdomen:  Soft, nondistended, nontender. No appreciable masses or hepatomegaly.  Neurologic:  Alert and  oriented x4;  grossly normal neurologically. Psych:  Alert and cooperative. Normal affect.    Impression / Plan:  23 y/o female here for therapeutic colonoscopy - removal of large sigmoid lipoma in light of some of her bowel symptoms and pain. I have discussed risks / benefits of colonoscopy and removal of this lesion, including risks for bleeding and perforation, she wished  to proceed. Further recommendations pending the results.   Helen Cellar, MD [facility]

## 2018-06-04 NOTE — Progress Notes (Signed)
Patient had persistent pain post procedure, could not get her comfortable despite IV pain medication. CT scan done with no evidence of perforation or bowel injury. She was admitted for pain control and observation overnight given her symptoms, I appreciate hospitalist assistance in her case. I saw her this evening and she is feeling much better. Her pain has mostly resolved at this time. Spoke with patient and father. Unclear if she had discomfort from epinephrine or cautery / thermal injury. Will observe overnight to make sure she is pain free. If feeling okay in the AM she can be discharged home.   Please call with questions.  [place] Cellar, MD [facility]

## 2018-06-04 NOTE — Progress Notes (Signed)
Pt received to recovery at 1154 upon arousing at 1200 c/o of abdominal pain 10/10.  MD notified orders received.  Pt continued to complain of pain 10/10.  MD at bedside and assessed patient.  A portable xray of the abdomen obtained.  Continued pain management with multiple doses of dilaudid and zofran per MD. NS bolus given per MD. No change in status.  Transported to CT for CT of abd/pelvis with contrast at 1420.  IV dose of toradol ordered MD post CT.  No change in status. IV Benedryl given for itching per MD order.  Per MD will admit to observation for pain management etiology unclear.

## 2018-06-04 NOTE — Progress Notes (Signed)
Patient underwent flex sig for removal of sigmoid lipoma. Epinephrine was used to inject the lesion prior to removal. The procedure went well, base of polypectomy defect clipped. Patient is experiencing post procedure pain in the lower abdomen. Vitals are normal and abdomen is soft. Patient with persistent discomfort. Given some IV dilauded and zofran. I suspect symptoms may be due to epinephrine injection, although with get xray to ensure okay. I think likelihood of perforation is quite low (lesion removed at lower 1/3rd of the stalk of it). Hopefully with more time her symptoms abate. I spent time with the patient and her mother explaining the situation, will monitor course

## 2018-06-04 NOTE — Interval H&P Note (Signed)
History and Physical Interval Note:  06/04/2018 11:13 AM  Angela [name]  has presented today for surgery, with the diagnosis of colon lipoma  The various methods of treatment have been discussed with the patient and family. After consideration of risks, benefits and other options for treatment, the patient has consented to  Procedure(s): COLONOSCOPY WITH PROPOFOL (N/A) as a surgical intervention .  The patient's history has been reviewed, patient examined, no change in status, stable for surgery.  I have reviewed the patient's chart and labs.  Questions were answered to the patient's satisfaction.     [place]

## 2018-06-04 NOTE — Transfer of Care (Signed)
Immediate Anesthesia Transfer of Care Note  Patient: Angela [name]  Procedure(s) Performed: SCLEROTHERAPY BIOPSY HEMOSTASIS CLIP PLACEMENT FLEXIBLE SIGMOIDOSCOPY (N/A )  Patient Location: PACU  Anesthesia Type:MAC  Level of Consciousness: sedated  Airway & Oxygen Therapy: Patient Spontanous Breathing and Patient connected to nasal cannula oxygen  Post-op Assessment: Report given to RN and Post -op Vital signs reviewed and stable  Post vital signs: Reviewed and stable  Last Vitals:  Vitals Value Taken Time  BP 107/58 06/04/2018 11:54 AM  Temp 36.4 C 06/04/2018 11:54 AM  Pulse 52 06/04/2018 11:54 AM  Resp 16 06/04/2018 11:54 AM  SpO2 100 % 06/04/2018 11:54 AM  Vitals shown include unvalidated device data.  Last Pain:  Vitals:   06/04/18 1154  TempSrc: Oral  PainSc:          Complications: No apparent anesthesia complications

## 2018-06-04 NOTE — Anesthesia Postprocedure Evaluation (Signed)
Anesthesia Post Note  Patient: Angela [name]  Procedure(s) Performed: SCLEROTHERAPY BIOPSY HEMOSTASIS CLIP PLACEMENT FLEXIBLE SIGMOIDOSCOPY (N/A )     Patient location during evaluation: Endoscopy Anesthesia Type: MAC Level of consciousness: sedated, patient cooperative and oriented Pain management: pain level controlled (pain much improved) Respiratory status: spontaneous breathing, nonlabored ventilation and respiratory function stable Cardiovascular status: blood pressure returned to baseline and stable Postop Assessment: no apparent nausea or vomiting Anesthetic complications: no    Last Vitals:  Vitals:   06/04/18 1235 06/04/18 1240  BP: 131/81 (!) 129/101  Pulse: (!) 54 (!) 49  Resp: 14 17  Temp:    SpO2: 100% 100%    Last Pain:  Vitals:   06/04/18 1240  TempSrc:   PainSc: 10-Worst pain ever                 Maritssa Haughton,E. Anaid Haney

## 2018-06-05 DIAGNOSIS — F339 Major depressive disorder, recurrent, unspecified: Secondary | ICD-10-CM | POA: Diagnosis not present

## 2018-06-05 DIAGNOSIS — D175 Benign lipomatous neoplasm of intra-abdominal organs: Secondary | ICD-10-CM | POA: Diagnosis not present

## 2018-06-05 DIAGNOSIS — R1032 Left lower quadrant pain: Secondary | ICD-10-CM | POA: Diagnosis not present

## 2018-06-05 DIAGNOSIS — G43809 Other migraine, not intractable, without status migrainosus: Secondary | ICD-10-CM | POA: Diagnosis not present

## 2018-06-05 DIAGNOSIS — F902 Attention-deficit hyperactivity disorder, combined type: Secondary | ICD-10-CM | POA: Diagnosis not present

## 2018-06-05 NOTE — Progress Notes (Signed)
  The patient had a good night. Her pain had mostly resolved by the time I saw her last evening. She has had no further pain, slept well, denies any pain at this time. She ate last night and tolerated it. Imaging did not show any concerning findings. I suspect this either was due to epinephrine used during the procedure versus thermal injury from cauterization, unclear. I have discussed the situation with her parents and reassured them. I think she can go home this morning, can use tylenol, occasional ibuprofen as needed if pain recurs. Otherwise I will follow up pathology results with her when it returns. I appreciate assistance of hospitalist team.   Please call with any questions.  Angela Cellar, MD [facility]

## 2018-06-05 NOTE — Progress Notes (Signed)
Pt d/c 'd home with her mother. Both verbalized understanding of d/c intructions

## 2018-06-05 NOTE — Discharge Summary (Signed)
Physician Discharge Summary  Angela [name] ZTI:[id number] DOB: 12/19/1995 DOA: 06/04/2018  PCP: Tomasa Hose, NP  Admit date: 06/04/2018 Discharge date: 06/05/2018  Admitted From: Endoscopy suite  Disposition:  Home   Recommendations for Outpatient Follow-up:  1. Follow up with Gastroenterology as directed   Home Health: None  Equipment/Devices: None  Discharge Condition: Good  CODE STATUS: FULL Diet recommendation: Regular  Brief/Interim Summary: Angela [name] is a 23 y.o. F with hx intractable migraines, generalized anxiety, and ADD who presented for lipoma removal by endsocopy due to persistent pain, bleeding, and bowel symtoms from the lipoma.  The procedure was unremarkable, but post-procedure, the patient had abnormally severe colicky abdomeinal pain.  It is sharp, constant with colicky spasms.  It is not associated with rectal bleeding, sweats, fever/chills, vomiting.     PRINCIPAL HOSPITAL DIAGNOSIS: Post-colonoscopy pain, suspect visceral hypersensitivity in setting of pain from epinephrine injection and/or cauterization    Discharge Diagnoses:   Abdominal pain from epinephrine injection or cauterization Treated with Toradol with good relief.  Radiograph and CT abdomen unremarkable (see below).  Able to tolerate food and pain resolved.            Discharge Instructions  Discharge Instructions    Diet general   Complete by:  As directed    Discharge instructions   Complete by:  As directed    From Dr. Loleta Books: You were observed overnight last night due to persistent and severe pain after your lipoma removal.  Thankfully, the CT scan of your belly did not show anything out of the ordinary.  If you have any persistent pain this morning or tomorrow, Aleve or ibuprofen would be reasonable to take. Follow up with Dr. Havery Moros in his office as directed.   Increase activity slowly   Complete by:  As directed      Allergies as of 06/05/2018       Reactions   Cyanoacrylate Rash   Component of dermabond   Clavulanic Acid Nausea And Vomiting   Pt can have penicillins   Compazine [prochlorperazine Edisylate] Other (See Comments)   COMBATIVE AND BELLIGERENT   Imipramine Rash   Ketamine Rash, Other (See Comments)   HALLUCINATIONS AND COMBATIVE   Prednisone Other (See Comments)   Allergy tests confirmed affects skin and mood.    Topamax [topiramate] Hives, Rash   COMBATIVE   Haldol [haloperidol] Other (See Comments)   Becomes combative and beligerent   Adhesive [tape] Rash   BLISTERS   Flagyl [metronidazole] Rash   Other Rash   Monocryl      Medication List    TAKE these medications   AJOVY 225 MG/1.5ML Sosy Generic drug:  Fremanezumab-vfrm Inject 225 mg into the skin every 30 (thirty) days.   cetirizine 10 MG tablet Commonly known as:  ZYRTEC Take 10 mg by mouth at bedtime.   DEXILANT 60 MG capsule Generic drug:  dexlansoprazole Take 60 mg by mouth daily before breakfast.   EPINEPHrine 0.3 mg/0.3 mL Soaj injection Commonly known as:  EPIPEN 2-PAK Inject 0.3 mLs (0.3 mg total) into the muscle as needed. What changed:  reasons to take this   lamoTRIgine 100 MG tablet Commonly known as:  LAMICTAL Take 100 mg by mouth at bedtime.   magnesium oxide 400 MG tablet Commonly known as:  MAG-OX Take 400 mg by mouth daily.   naratriptan 2.5 MG tablet Commonly known as:  AMERGE Take 2.5 mg by mouth every 4 (four) hours as needed for migraine.  ondansetron 4 MG tablet Commonly known as:  ZOFRAN Take 4 mg by mouth every 8 (eight) hours as needed for nausea.   tiZANidine 4 MG tablet Commonly known as:  ZANAFLEX Take 4 mg by mouth every 6 (six) hours as needed for muscle spasms.   traZODone 100 MG tablet Commonly known as:  DESYREL Take 1 tablet (100 mg total) by mouth at bedtime. What changed:    when to take this  reasons to take this   Vilazodone HCl 20 MG Tabs Commonly known as:  VIIBRYD Take 1 tablet  (20 mg total) by mouth daily.       Allergies  Allergen Reactions  . Cyanoacrylate Rash    Component of dermabond  . Clavulanic Acid Nausea And Vomiting    Pt can have penicillins  . Compazine [Prochlorperazine Edisylate] Other (See Comments)    COMBATIVE AND BELLIGERENT  . Imipramine Rash  . Ketamine Rash and Other (See Comments)    HALLUCINATIONS AND COMBATIVE  . Prednisone Other (See Comments)    Allergy tests confirmed affects skin and mood.   . Topamax [Topiramate] Hives and Rash    COMBATIVE  . Haldol [Haloperidol] Other (See Comments)    Becomes combative and beligerent  . Adhesive [Tape] Rash    BLISTERS  . Flagyl [Metronidazole] Rash  . Other Rash    Monocryl    Consultations:  GI   Procedures/Studies: Ct Abdomen Pelvis W Contrast  Result Date: 06/04/2018 CLINICAL DATA:  Severe abdominal pain. Postop from colonoscopic resection of large sigmoid colon lipoma earlier today. EXAM: CT ABDOMEN AND PELVIS WITH CONTRAST TECHNIQUE: Multidetector CT imaging of the abdomen and pelvis was performed using the standard protocol following bolus administration of intravenous contrast. CONTRAST:  148mL OMNIPAQUE IOHEXOL 300 MG/ML  SOLN COMPARISON:  10/06/2013 FINDINGS: Lower Chest: No acute findings. Hepatobiliary: No hepatic masses identified. Prior cholecystectomy. No evidence of biliary obstruction. Pancreas:  No mass or inflammatory changes. Spleen: Within normal limits in size and appearance. Adrenals/Urinary Tract: No masses identified. No evidence of hydronephrosis. Stomach/Bowel: No evidence of obstruction, inflammatory process or abnormal fluid collections. Clips are seen in the sigmoid colon, however there is no evidence of wall thickening, extraluminal air or free intraperitoneal air. Vascular/Lymphatic: No pathologically enlarged lymph nodes. No abdominal aortic aneurysm. Reproductive:  No mass or other significant abnormality. Other:  None. Musculoskeletal: No suspicious  bone lesions identified. Bilateral L5 pars defects incidentally noted, without associated spondylolisthesis. IMPRESSION: No complications or other acute findings identified. Bilateral L5 pars defects incidentally noted. Electronically Signed   By: Earle Gell M.D.   On: 06/04/2018 14:50   Dg Abd Portable 1v  Result Date: 06/04/2018 CLINICAL DATA:  Mid abdominal pain. EXAM: PORTABLE ABDOMEN - 1 VIEW COMPARISON:  03/08/2016. FINDINGS: Surgical clips right upper quadrant. Clips also noted the left lower quadrant. Surgical sutures noted over the right abdomen. No bowel distention. No free air. No acute intra-abdominal abnormality identified. Pelvic calcifications consistent phleboliths. Lung bases are clear. No acute bony abnormality. IMPRESSION: No acute abnormality. Electronically Signed   By: Marcello Moores  Register   On: 06/04/2018 13:08       Subjective: Feeling better.  Pain minimal.  Ate overnight.  Slept well.    Discharge Exam: Vitals:   06/04/18 2132 06/05/18 0543  BP: 98/72 106/72  Pulse: 65 63  Resp: 14 14  Temp: 97.9 F (36.6 C) 97.9 F (36.6 C)  SpO2: 99% 99%   Vitals:   06/04/18 1600 06/04/18 1617 06/04/18 2132  06/05/18 0543  BP: 102/69 113/75 98/72 106/72  Pulse: 100 98 65 63  Resp: 16 18 14 14   Temp:  97.8 F (36.6 C) 97.9 F (36.6 C) 97.9 F (36.6 C)  TempSrc:  Oral Oral Oral  SpO2: 98% 96% 99% 99%  Weight:  81.6 kg    Height:  5\' 7"  (1.702 m)      General: Pt is sleepy but awake, not in acute distress Cardiovascular: RRR, nl S1-S2, no murmurs appreciated.   No LE edema.   Respiratory: Normal respiratory rate and rhythm.  CTAB without rales or wheezes. Abdominal: Abdomen soft no guarding, minimal wince to palpation in LLQ but no rebound, no rigidity, better than yesterday.  No distension.      The results of significant diagnostics from this hospitalization (including imaging, microbiology, ancillary and laboratory) are listed below for reference.      Microbiology: No results found for this or any previous visit (from the past 240 hour(s)).   Labs: BNP (last 3 results) No results for input(s): BNP in the last 8760 hours. Basic Metabolic Panel: No results for input(s): NA, K, CL, CO2, GLUCOSE, BUN, CREATININE, CALCIUM, MG, PHOS in the last 168 hours. Liver Function Tests: No results for input(s): AST, ALT, ALKPHOS, BILITOT, PROT, ALBUMIN in the last 168 hours. No results for input(s): LIPASE, AMYLASE in the last 168 hours. No results for input(s): AMMONIA in the last 168 hours. CBC: No results for input(s): WBC, NEUTROABS, HGB, HCT, MCV, PLT in the last 168 hours. Cardiac Enzymes: No results for input(s): CKTOTAL, CKMB, CKMBINDEX, TROPONINI in the last 168 hours. BNP: Invalid input(s): POCBNP CBG: No results for input(s): GLUCAP in the last 168 hours. D-Dimer No results for input(s): DDIMER in the last 72 hours. Hgb A1c No results for input(s): HGBA1C in the last 72 hours. Lipid Profile No results for input(s): CHOL, HDL, LDLCALC, TRIG, CHOLHDL, LDLDIRECT in the last 72 hours. Thyroid function studies No results for input(s): TSH, T4TOTAL, T3FREE, THYROIDAB in the last 72 hours.  Invalid input(s): FREET3 Anemia work up No results for input(s): VITAMINB12, FOLATE, FERRITIN, TIBC, IRON, RETICCTPCT in the last 72 hours. Urinalysis    Component Value Date/Time   COLORURINE YELLOW 09/28/2013 1203   APPEARANCEUR CLEAR 09/28/2013 1203   LABSPEC 1.008 09/28/2013 1203   PHURINE 6.5 09/28/2013 1203   GLUCOSEU NEGATIVE 09/28/2013 1203   HGBUR NEGATIVE 09/28/2013 [place] 09/28/2013 [place] 09/28/2013 1203   PROTEINUR NEGATIVE 09/28/2013 1203   UROBILINOGEN 0.2 09/28/2013 1203   NITRITE NEGATIVE 09/28/2013 1203   LEUKOCYTESUR NEGATIVE 09/28/2013 1203   Sepsis Labs Invalid input(s): PROCALCITONIN,  WBC,  LACTICIDVEN Microbiology No results found for this or any previous visit (from  the past 240 hour(s)).   Time coordinating discharge: 15 minutes       SIGNED:   Edwin Dada, MD  [facility] 06/05/2018, 7:58 AM

## 2018-06-06 ENCOUNTER — Encounter (HOSPITAL_COMMUNITY): Payer: Self-pay | Admitting: Gastroenterology

## 2018-08-20 ENCOUNTER — Ambulatory Visit (HOSPITAL_COMMUNITY): Admitting: Psychiatry

## 2018-08-21 ENCOUNTER — Telehealth: Payer: Self-pay | Admitting: Gastroenterology

## 2018-08-21 ENCOUNTER — Other Ambulatory Visit: Payer: Self-pay

## 2018-08-21 MED ORDER — DEXLANSOPRAZOLE 30 MG PO CPDR: 30.0000 mg | DELAYED_RELEASE_CAPSULE | Freq: Every day | ORAL | 0 refills | Status: DC

## 2018-08-21 NOTE — Telephone Encounter (Signed)
Pt called and stated that she lost her bottle of medication and wants to know if she can get another prescription

## 2018-08-21 NOTE — Telephone Encounter (Signed)
Spoke to patient. She would like to try the 30mg  Dexilant that Dr. Havery Moros recommended at her last office visit.  Sent local script for dexilant 30mg  and if this does not manage her Symptoms she can request 60mg  to be sent to her mail order for 90 day script.

## 2018-09-10 NOTE — Addendum Note (Signed)
Addended by: Roetta Sessions on: 09/10/2018 09:19 AM   Modules accepted: Orders

## 2018-09-10 NOTE — Telephone Encounter (Signed)
Script was denied because insurance was not current. Called pt. She said she is working on getting that resolved with [employer].  Once it is resolved she will have them send a new refill request to [place] for the Dexilant 30mg .

## 2019-01-10 DIAGNOSIS — E559 Vitamin D deficiency, unspecified: Secondary | ICD-10-CM | POA: Insufficient documentation

## 2019-03-05 DIAGNOSIS — S86319A Strain of muscle(s) and tendon(s) of peroneal muscle group at lower leg level, unspecified leg, initial encounter: Secondary | ICD-10-CM | POA: Insufficient documentation

## 2019-03-15 ENCOUNTER — Other Ambulatory Visit: Payer: Self-pay

## 2019-03-15 ENCOUNTER — Encounter (HOSPITAL_COMMUNITY): Admitting: Psychiatry

## 2019-03-17 DIAGNOSIS — M25571 Pain in right ankle and joints of right foot: Secondary | ICD-10-CM | POA: Insufficient documentation

## 2019-03-20 ENCOUNTER — Ambulatory Visit (INDEPENDENT_AMBULATORY_CARE_PROVIDER_SITE_OTHER): Admitting: Psychiatry

## 2019-03-20 ENCOUNTER — Other Ambulatory Visit: Payer: Self-pay

## 2019-03-20 ENCOUNTER — Encounter (HOSPITAL_COMMUNITY): Payer: Self-pay | Admitting: Psychiatry

## 2019-03-20 DIAGNOSIS — F411 Generalized anxiety disorder: Secondary | ICD-10-CM | POA: Diagnosis not present

## 2019-03-20 DIAGNOSIS — F900 Attention-deficit hyperactivity disorder, predominantly inattentive type: Secondary | ICD-10-CM

## 2019-03-20 DIAGNOSIS — F331 Major depressive disorder, recurrent, moderate: Secondary | ICD-10-CM

## 2019-03-20 MED ORDER — VIIBRYD 20 MG PO TABS: 20.0000 mg | ORAL_TABLET | Freq: Every day | ORAL | 0 refills | Status: DC

## 2019-03-20 MED ORDER — TRAZODONE HCL 50 MG PO TABS: 50.0000 mg | ORAL_TABLET | Freq: Every day | ORAL | 0 refills | Status: DC

## 2019-03-20 NOTE — Progress Notes (Signed)
Virtual Visit via Telephone Note  I connected with Angela [name] on 03/20/19 at  9:40 AM EDT by telephone and verified that I am speaking with the correct person using two identifiers.   I discussed the limitations, risks, security and privacy concerns of performing an evaluation and management service by telephone and the availability of in person appointments. I also discussed with the patient that there may be a patient responsible charge related to this service. The patient expressed understanding and agreed to proceed.   History of Present Illness: Patient is evaluated by phone session.  She was last seen in December 2019.  She admitted missing appointments because she was busy in the school and feeling overwhelmed.  She was getting her medication from the pharmacy.  We have recommended to try reducing the Viibryd and to stop for 2 weeks as patient complain that it was making her foggy and tired.  However when she tried to stop the Viibryd her anxiety started to come back and she is continued on Viibryd 10 mg.  We also discussed therapy but patient is not interested.  She is not taking the Lamictal prescribed by neurology for headaches.  She had tried multiple medication for her chronic migraine headaches.  She had tried Lamictal, Toradol, muscle relaxant, pain medication and now she is taking injection for headaches.  She still have episodes of severe headaches.  So far she is tolerating Viibryd 10 mg but admitted there are episodes of severe depression when she feels hopeless and helpless and does not have motivation to do things.  She admitted school is stressful.  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  She is hoping to graduate next May.  She is also working part-time at [employer].  She lives with her 2 roommates.  She has ADD but she does not want to take any stimulant because her headaches get worse.  She denies any agitation, anger, mania, psychosis or any  hallucination.  She reported energy level is fair.  Sometimes she has difficulty attention and focus but so far there has been no issues in her grades.  She admitted easily overwhelmed with the study and part-time job.  She denies drinking or using any illegal substances.  She reported her weight is a stable.  She denies any major panic attack.  She denies any suicidal thoughts.  Past Psychiatric History: Viewed. H/O ADD, anxiety, major depression since school age.  Had psychological testing by [facility].  Tried Remeron, amitriptyline, Focalin, clonidine, Lexapro, Concerta (Headaches), Intuniv and Effexor.  We had gene testing results shows Lexapro, amitriptyline, Wellbutrin, Viibryd, Pristiq had good selections.  Given lamictal and Depakote by Neurology. No h/o suicidal attempt or inpatient treatment.  No results found for this or any previous visit (from the past 2160 hour(s)).     Psychiatric Specialty Exam: Physical Exam  Review of Systems  Neurological: Positive for headaches. Negative for dizziness.  Psychiatric/Behavioral: Positive for depression. The patient is nervous/anxious.     There were no vitals taken for this visit.There is no height or weight on file to calculate BMI.  General Appearance: NA  Eye Contact:  NA  Speech:  Normal Rate and Slow  Volume:  Normal  Mood:  Anxious  Affect:  NA  Thought Process:  Goal Directed  Orientation:  Full (Time, Place, and Person)  Thought Content:  Logical  Suicidal Thoughts:  No  Homicidal Thoughts:  No  Memory:  Immediate;   Good Recent;  Good Remote;   Good  Judgement:  Fair  Insight:  Fair  Psychomotor Activity:  NA  Concentration:  Concentration: Fair and Attention Span: Fair  Recall:  Good  Fund of Knowledge:  Good  Language:  Good  Akathisia:  No  Handed:  Right  AIMS (if indicated):     Assets:  Communication Skills Desire for Improvement Talents/Skills Transportation  ADL's:  Intact  Cognition:  WNL  Sleep:   ok       Assessment and Plan: Major depressive disorder, recurrent.  Generalized anxiety disorder.  Attention deficit disorder, inattentive type, sleep apnea.  Patient evaluated after the last visit in December 2019.  She is no longer taking Lamictal and muscle relaxant.  She continued Viibryd but low-dose 10 mg and trazodone 50 mg.  She has episodes of depression when she feels hopeless and helpless.  I recommend that she should go back onto Viibryd 20 mg which was helping her depression and anxiety.  I also encouraged that she should keep appointment for future refills and a better management of her illness.  Patient is not interested in stimulant since it caused worsening of headaches.  She is also not use CPAP on a regularly because she does not feel its helping as much.  She rather take trazodone which gives her good night sleep.  She is not interested in therapy.  Discussed medication side effects and benefits.  Recommended to call us back if is any question or any concern.  Follow-up in 2 months.  Time spent 30 minutes.  More than 50% of the time spent in psychoeducation, counseling, coronation of care and reviewing her chart and blood work results.  Follow Up Instructions:    I discussed the assessment and treatment plan with the patient. The patient was provided an opportunity to ask questions and all were answered. The patient agreed with the plan and demonstrated an understanding of the instructions.   The patient was advised to call back or seek an in-person evaluation if the symptoms worsen or if the condition fails to improve as anticipated.  I provided 30 minutes of non-face-to-face time during this encounter.   Kathlee Nations, MD

## 2019-04-15 ENCOUNTER — Ambulatory Visit (INDEPENDENT_AMBULATORY_CARE_PROVIDER_SITE_OTHER): Admitting: Psychiatry

## 2019-04-15 ENCOUNTER — Encounter (HOSPITAL_COMMUNITY): Payer: Self-pay | Admitting: Psychiatry

## 2019-04-15 ENCOUNTER — Other Ambulatory Visit: Payer: Self-pay

## 2019-04-15 DIAGNOSIS — F411 Generalized anxiety disorder: Secondary | ICD-10-CM

## 2019-04-15 DIAGNOSIS — F331 Major depressive disorder, recurrent, moderate: Secondary | ICD-10-CM | POA: Diagnosis not present

## 2019-04-15 MED ORDER — BUPROPION HCL ER (XL) 150 MG PO TB24: 150.0000 mg | ORAL_TABLET | Freq: Every day | ORAL | 1 refills | Status: DC

## 2019-04-15 NOTE — Progress Notes (Signed)
Virtual Visit via Telephone Note  I connected with Angela [name] on 04/15/19 at  1:20 PM EST by telephone and verified that I am speaking with the correct person using two identifiers.   I discussed the limitations, risks, security and privacy concerns of performing an evaluation and management service by telephone and the availability of in person appointments. I also discussed with the patient that there may be a patient responsible charge related to this service. The patient expressed understanding and agreed to proceed.   History of Present Illness: Patient requested earlier appointment because she is not feeling good despite increase Viibryd.  She has no motivation to do things.  She is tired and sleeping 9 to 10 hours every night.  She reported sadness, feel like she is in a fog and afraid that she is not doing good in the school.  She like to try a different medication.  Her headaches are not as bad since she is taking injection regularly.  She is working part-time at a park.  She lives with her 2 roommates.  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  Her energy level is low.  She denies drinking or using any illegal substances.  Her appetite is okay.  She has no tremors, shakes or any EPS.  She feels sometimes hopeless but denies any suicidal thoughts.   Past Psychiatric History:Viewed. H/O ADD, anxiety, major depression since school age. Had psychological testing by [facility]. Tried Remeron, amitriptyline, Focalin, clonidine, Lexapro, Concerta (Headaches), Intuniv and Effexor. Recently Viibryd but ineffective. We had gene testing results shows Lexapro, amitriptyline, Wellbutrin, Viibryd, Pristiq had good selections. Given lamictal and Depakote by Neurology. No h/o suicidal attempt or inpatient treatment.   Psychiatric Specialty Exam: Physical Exam  ROS  There were no vitals taken for this visit.There is no height or weight on file to calculate  BMI.  General Appearance: NA  Eye Contact:  NA  Speech:  Clear and Coherent and Slow  Volume:  Normal  Mood:  Anxious, Dysphoric and Irritable  Affect:  NA  Thought Process:  Goal Directed  Orientation:  Full (Time, Place, and Person)  Thought Content:  Rumination  Suicidal Thoughts:  No  Homicidal Thoughts:  No  Memory:  Immediate;   Good Recent;   Good Remote;   Good  Judgement:  Fair  Insight:  Good  Psychomotor Activity:  NA  Concentration:  Concentration: Fair and Attention Span: Fair  Recall:  Good  Fund of Knowledge:  Good  Language:  Good  Akathisia:  No  Handed:  Right  AIMS (if indicated):     Assets:  Communication Skills Desire for Improvement Housing Resilience Social Support  ADL's:  Intact  Cognition:  WNL  Sleep:   8-9 hrs      Assessment and Plan: Major depressive disorder, recurrent.  Generalized anxiety disorder.  Attention deficit disorder, inattentive type.  Recommend to try Wellbutrin since she has not tried before and GeneSight testing shows Wellbutrin is in good section.  Recommend to decrease Viibryd 10 mg for few days and stop.  Try Wellbutrin XL 150 mg daily.  I also suggest cut down the trazodone to half tablet since she is sleeping 9 hours.  She is not using CPAP on a regular basis.  She is not interested in therapy.  Discussed medication side effects and benefits.  Recommended to call us back if she has any question or any concern.  Follow-up in 4 to  6 weeks.  Follow Up Instructions:    I discussed the assessment and treatment plan with the patient. The patient was provided an opportunity to ask questions and all were answered. The patient agreed with the plan and demonstrated an understanding of the instructions.   The patient was advised to call back or seek an in-person evaluation if the symptoms worsen or if the condition fails to improve as anticipated.  I provided 20 minutes of non-face-to-face time during this encounter.   Kathlee Nations, MD

## 2019-05-06 DIAGNOSIS — D179 Benign lipomatous neoplasm, unspecified: Secondary | ICD-10-CM | POA: Insufficient documentation

## 2019-05-07 ENCOUNTER — Telehealth (HOSPITAL_COMMUNITY): Payer: Self-pay | Admitting: *Deleted

## 2019-05-07 NOTE — Telephone Encounter (Signed)
She started Wellbutrin after reviewing the GeneSight testing.  She had tried Viibryd, Lexapro and both of them are also shows no interaction according to GeneSight testing.  If she do not recall taking Zoloft then she can try 50 mg half tablet for 1 week and then full dose daily.  She can stop the Wellbutrin.  Please call the prescription if she agree with the plan.  Other option is to take Pristiq which is a metabolite of Effexor and she had tried Effexor in the past.

## 2019-05-07 NOTE — Telephone Encounter (Signed)
Pt called c/o feeling increasingly hopelessness and anhedonia. Pt says she feels the Wellbutrin has not been working. Please advise.    buPROPion (WELLBUTRIN XL) 150 MG 24 hr tablet 150 mg, Daily 1 ordered       Summary: Take 1 tablet (150 mg total) by mouth daily., Starting Tue 11/

## 2019-05-07 NOTE — Telephone Encounter (Signed)
Writer spoke with pt who says she feels Wellbutrin is causing s/s as she was not feeling this way "before  and is asking if medication can be changed. Pt has had Genesight testing recently. Please advise.

## 2019-05-07 NOTE — Telephone Encounter (Signed)
We can try to increase wellbutrin to 300 mg. If she agree than call her pharmacy with new dose. Thanx

## 2019-05-08 MED ORDER — SERTRALINE HCL 50 MG PO TABS: ORAL_TABLET | ORAL | 1 refills | Status: DC

## 2019-05-08 NOTE — Telephone Encounter (Signed)
Writer spoke with pt who says she has never tried Zoloft and is willing to start it. RX sent to [facility] [address]. Wellbutrin d/c'd.

## 2019-05-12 ENCOUNTER — Encounter (HOSPITAL_COMMUNITY): Payer: Self-pay | Admitting: Psychiatry

## 2019-05-12 ENCOUNTER — Ambulatory Visit (INDEPENDENT_AMBULATORY_CARE_PROVIDER_SITE_OTHER): Admitting: Psychiatry

## 2019-05-12 ENCOUNTER — Other Ambulatory Visit: Payer: Self-pay

## 2019-05-12 DIAGNOSIS — F410 Panic disorder [episodic paroxysmal anxiety] without agoraphobia: Secondary | ICD-10-CM | POA: Diagnosis not present

## 2019-05-12 DIAGNOSIS — F331 Major depressive disorder, recurrent, moderate: Secondary | ICD-10-CM | POA: Diagnosis not present

## 2019-05-12 MED ORDER — PROPRANOLOL HCL 10 MG PO TABS: ORAL_TABLET | ORAL | 0 refills | Status: AC

## 2019-05-12 MED ORDER — SERTRALINE HCL 50 MG PO TABS: 75.0000 mg | ORAL_TABLET | Freq: Every day | ORAL | 0 refills | Status: DC

## 2019-05-12 MED ORDER — TRAZODONE HCL 50 MG PO TABS: 50.0000 mg | ORAL_TABLET | Freq: Every day | ORAL | 0 refills | Status: DC

## 2019-05-12 NOTE — Progress Notes (Addendum)
Virtual Visit via Telephone Note  I connected with Angela [name] on 05/12/19 at 11:00 AM EST by telephone and verified that I am speaking with the correct person using two identifiers.   I discussed the limitations, risks, security and privacy concerns of performing an evaluation and management service by telephone and the availability of in person appointments. I also discussed with the patient that there may be a patient responsible charge related to this service. The patient expressed understanding and agreed to proceed.   History of Present Illness: Patient was evaluated by phone session.  We recently started her on Zoloft because she felt the bilirubin making her depression worse.  She is now Zoloft 25 mg and hoping to increase 50 mg in few days.  So far she is tolerating her medication and reported no side effects.  She admitted having panic attacks and she used to take Xanax but it makes her very sleepy.  She had a good response with Inderal and like to get a prescription so she can take it as needed when she had panic attack.  She still working part-time on the weekend at [employer] and studying urban planning and transportation at [employer].  Her grades are going okay.  Since we cut down the trazodone her sleep is better and she is not sedated in the morning.  She admitted not using her CPAP because machine is at her parents house and she is hoping to go back to home in first week of January and start using CPAP.  She still have headaches and she takes medication for that.  However she takes Depakote only if she had tried other medicine to abort the headaches.  She is not taking Depakote every day.  Her energy level is fair.  Her appetite is okay.  She denies drinking or using any illegal substances.  She denies any feeling of hopelessness or worthlessness but admitted chronic sadness and anxiety.  Past Psychiatric History:Viewed. H/OADD, anxiety, major depression since school age. Had  psychological testing by [facility]. Tried Remeron, amitriptyline, Focalin, clonidine, Lexapro, Concerta (Headaches), Intuniv and Effexor. Recently Viibryd and wellbutrin but ineffective. We had gene testing results shows Lexapro, amitriptyline, Wellbutrin, Viibryd, Pristiq had good selections.Given lamictal and Depakote by Neurology.No h/o suicidal attempt or inpatient treatment.   Psychiatric Specialty Exam: Physical Exam  Review of Systems  There were no vitals taken for this visit.There is no height or weight on file to calculate BMI.  General Appearance: NA  Eye Contact:  NA  Speech:  Clear and Coherent and Slow  Volume:  Decreased  Mood:  Anxious and Dysphoric  Affect:  NA  Thought Process:  Goal Directed  Orientation:  Full (Time, Place, and Person)  Thought Content:  Rumination  Suicidal Thoughts:  No  Homicidal Thoughts:  No  Memory:  Immediate;   Good Recent;   Good Remote;   Good  Judgement:  Intact  Insight:  Present  Psychomotor Activity:  NA  Concentration:  Concentration: Good and Attention Span: Good  Recall:  Good  Fund of Knowledge:  Good  Language:  Good  Akathisia:  No  Handed:  Right  AIMS (if indicated):     Assets:  Communication Skills Desire for [place] [employer]ills Transportation  ADL's:  Intact  Cognition:  WNL  Sleep:   good      Assessment and Plan: Major depressive disorder, recurrent.  Anxiety/panic attacks.   I reviewed her chart.  Patient  had tried multiple medication in the past and we had GeneSight testing and Viibryd and Wellbutrin were in a good section but patient did not see any improvement.  Now she is taking Zoloft which was started few days ago.  So far she is tolerating her medication and reported no side effects.  I recommended to take 50 mg for at least 3 to 4 weeks and then try 75 mg daily.  Also recommend if she feels that her symptoms started to get worse or if she  having any side effects and she should call us immediately.  She like to get refill on propanolol which she usually take when she has panic attacks.  We will start propanolol 10 mg to take as needed for panic attacks.  Continue trazodone 50 mg at bedtime which is helping her sleep and she is not oversedated.  Discussed medication side effects and benefits.  Follow-up in 2 months.  She requesting all her medication to be sent at [facility].   Follow Up Instructions:    I discussed the assessment and treatment plan with the patient. The patient was provided an opportunity to ask questions and all were answered. The patient agreed with the plan and demonstrated an understanding of the instructions.   The patient was advised to call back or seek an in-person evaluation if the symptoms worsen or if the condition fails to improve as anticipated.  I provided 20 minutes of non-face-to-face time during this encounter.   Kathlee Nations, MD

## 2019-06-02 ENCOUNTER — Telehealth (HOSPITAL_COMMUNITY): Payer: Self-pay | Admitting: *Deleted

## 2019-06-02 NOTE — Telephone Encounter (Signed)
Writer spoke with pt who states that the change to Zoloft from Wellbutrin on 05/12/19 has made her increasingly anxious and irritable. Pt says she's stopped taking Zoloft and has increased the Propranolol to 20mg  bid as the anxiety and agitation are so bad. Pt has an upcoming appointment on 07/10/19. Pt is asking for something today. Writer educated pt regarding medication changes and early refills. Pt encouraged to utilize coping skills and support person. Pt denies s.i. Please review and advise.

## 2019-06-03 ENCOUNTER — Other Ambulatory Visit (HOSPITAL_COMMUNITY): Payer: Self-pay | Admitting: *Deleted

## 2019-06-03 ENCOUNTER — Telehealth (HOSPITAL_COMMUNITY): Payer: Self-pay | Admitting: *Deleted

## 2019-06-03 MED ORDER — DESVENLAFAXINE SUCCINATE ER 25 MG PO TB24: 25.0000 mg | ORAL_TABLET | Freq: Every day | ORAL | 0 refills | Status: DC

## 2019-06-03 NOTE — Telephone Encounter (Signed)
Writer spoke with pt regarding medication change. Pt instructed to discontinue Zoloft, which she already has, and began Pristiq 25mg  po qd. Med ed reinforced. Pt verbalizes understanding. Pt instructed to call with any questions or concerns.

## 2019-06-03 NOTE — Telephone Encounter (Signed)
I reviewed her GeneSight testing results.  She can try Pristiq if she has never tried before.  If she agrees we could try Pristiq 25 mg daily.  Discontinue Zoloft.  I will discuss more on her next appointment.

## 2019-07-07 ENCOUNTER — Other Ambulatory Visit (HOSPITAL_COMMUNITY): Payer: Self-pay | Admitting: *Deleted

## 2019-07-07 MED ORDER — DESVENLAFAXINE SUCCINATE ER 25 MG PO TB24: 25.0000 mg | ORAL_TABLET | Freq: Every day | ORAL | 0 refills | Status: DC

## 2019-07-10 ENCOUNTER — Other Ambulatory Visit: Payer: Self-pay

## 2019-07-10 ENCOUNTER — Ambulatory Visit (HOSPITAL_COMMUNITY): Admitting: Psychiatry

## 2019-08-15 DIAGNOSIS — F331 Major depressive disorder, recurrent, moderate: Secondary | ICD-10-CM | POA: Insufficient documentation

## 2020-02-10 DIAGNOSIS — M25562 Pain in left knee: Secondary | ICD-10-CM | POA: Insufficient documentation

## 2020-03-01 DIAGNOSIS — F419 Anxiety disorder, unspecified: Secondary | ICD-10-CM | POA: Insufficient documentation

## 2020-06-21 ENCOUNTER — Other Ambulatory Visit: Payer: Self-pay

## 2020-07-08 ENCOUNTER — Other Ambulatory Visit: Payer: Self-pay

## 2020-07-08 ENCOUNTER — Ambulatory Visit: Admitting: Gastroenterology

## 2020-07-08 ENCOUNTER — Telehealth: Payer: Self-pay | Admitting: Gastroenterology

## 2020-07-08 NOTE — Telephone Encounter (Signed)
Patient called LM on VM that she had not been able to look at her phone due to Covid to see she had appointment today.  I NS her before I heard VM, please advise

## 2020-07-13 ENCOUNTER — Telehealth: Payer: Self-pay | Admitting: Gastroenterology

## 2020-07-13 NOTE — Telephone Encounter (Signed)
I put patient as a NS and patient had lvm to cancel her appointment due to sickness. Patient will not be charged as I sent a message to our manager.

## 2020-09-01 ENCOUNTER — Ambulatory Visit: Payer: Medicaid Other | Admitting: Gastroenterology

## 2020-09-27 ENCOUNTER — Encounter: Payer: Self-pay | Admitting: Gastroenterology

## 2020-09-27 ENCOUNTER — Ambulatory Visit: Payer: 59 | Admitting: Gastroenterology

## 2020-09-27 ENCOUNTER — Other Ambulatory Visit: Payer: Self-pay

## 2020-09-27 VITALS — BP 112/78 | HR 93 | Temp 98.3°F | Ht 67.0 in | Wt 196.0 lb

## 2020-09-27 DIAGNOSIS — R195 Other fecal abnormalities: Secondary | ICD-10-CM | POA: Diagnosis not present

## 2020-09-27 DIAGNOSIS — K219 Gastro-esophageal reflux disease without esophagitis: Secondary | ICD-10-CM | POA: Diagnosis not present

## 2020-09-27 MED ORDER — PANTOPRAZOLE SODIUM 40 MG PO TBEC: 40.0000 mg | DELAYED_RELEASE_TABLET | Freq: Every day | ORAL | 3 refills | Status: DC

## 2020-09-28 NOTE — Progress Notes (Signed)
Angela [name] [address]  [place]  [place], [place] [address]  Main: [phone]  Fax: [phone]   Gastroenterology Consultation  Referring Provider:     Tomasa Hose, NP Primary Care Physician:  Tomasa Hose, NP Reason for Consultation:     GERD        HPI:    Chief Complaint  Patient presents with  . Gastroesophageal Reflux    Angela [name] is a 25 y.o. y/o female referred for consultation & management  by Dr. Lenor Derrick, Blaine Hamper, NP.  Patient with history of anxiety, with previous GI evaluation for her symptoms, referred to [place] for reflux.  Patient states she was on Dexilant but her insurance stopped covering it and so this was switched to Nexium.  Nexium did not help her symptoms of heartburn and so she stopped taking it.  She is reporting 1 loose bowel movement daily.  Does not have multiple bowel movements.  No blood in stool.  Previous records personally reviewed and she was seen at [facility] in 2020. Gastric emptying study in August 2020 showed rapid gastric transit  Please see GI notes and pH testing in her chart with previous extensive work-up  Past Medical History:  Diagnosis Date  . Acne   . ADHD (attention deficit hyperactivity disorder)   . Allergy   . Anxiety   . Arthritis   . Asthma   . Biliary dyskinesia 11/2013   symptomatic  . Chronic constipation   . Closed head injury 2012  . Cluster headaches   . Complication of anesthesia    wakes up combative  . Delayed gastric emptying   . Depression   . GERD (gastroesophageal reflux disease)   . Heart murmur   . Muscle spasms of neck    receives PT; needs to have neck support during surgery, per mother  . Pulmonary artery stenosis    left  . Sleep apnea    does not use a cpap    Past Surgical History:  Procedure Laterality Date  . [place] STUDY N/A 07/26/2015   Procedure: [place] STUDY;  Surgeon: Manus Gunning, MD;  Location: [facility]  ENDOS[facility];  Service: Gastroenterology;  Laterality: N/A;  . BIOPSY  06/04/2018   Procedure: BIOPSY;  Surgeon: Yetta Flock, MD;  Location: [facility];  Service: Gastroenterology;;  . CHOLECYSTECTOMY N/A 12/09/2013   Procedure: LAPAROSCOPIC CHOLECYSTECTOMY WITH INTRAOPERATIVE CHOLANGIOGRAM;  Surgeon: Gwenyth Ober, MD;  Location: [facility];  Service: General;  Laterality: N/A;  . ESOPHAGEAL MANOMETRY N/A 07/26/2015   Procedure: ESOPHAGEAL MANOMETRY (EM);  Surgeon: Manus Gunning, MD;  Location: [facility];  Service: Gastroenterology;  Laterality: N/A;  . ESOPHAGOGASTRODUODENOSCOPY (EGD) WITH PROPOFOL  07/26/2012  . FLEXIBLE SIGMOIDOSCOPY N/A 06/04/2018   Procedure: FLEXIBLE SIGMOIDOSCOPY;  Surgeon: Yetta Flock, MD;  Location: [facility];  Service: Gastroenterology;  Laterality: N/A;  . HYMENECTOMY  12/21/2006  . LAPAROSCOPIC APPENDECTOMY N/A 09/28/2013   Procedure: APPENDECTOMY LAPAROSCOPIC;  Surgeon: Gwenyth Ober, MD;  Location: [facility];  Service: General;  Laterality: N/A;  . LAPAROSCOPIC CHOLECYSTECTOMY  12/09/2013  . NASAL SEPTUM SURGERY  2009   x 2  . ROBOTIC ASSISTED LAPAROSCOPIC OVARIAN CYSTECTOMY Right 01/20/2011  . TONSILLECTOMY AND ADENOIDECTOMY  1999    Prior to Admission medications   Medication Sig Start Date End Date Taking? Authorizing Provider  cetirizine (ZYRTEC) 10 MG tablet Take 10 mg by mouth at bedtime.   Yes  [provider]  clonazePAM (KLONOPIN) 0.25 MG disintegrating tablet 0.25 mg 2 (two) times daily. 06/23/20  Yes [provider]  EPINEPHrine (EPIPEN 2-PAK) 0.3 mg/0.3 mL IJ SOAJ injection Inject 0.3 mLs (0.3 mg total) into the muscle as needed. Patient taking differently: Inject 0.3 mg into the muscle as needed (severe allergic reaction). 06/01/17  Yes Padgett, Rae Halsted, MD  Fremanezumab-vfrm 225 MG/1.5ML SOSY Inject 225 mg into the skin every 30 (thirty) days.    Yes [provider]  frovatriptan (FROVA) 2.5 MG tablet  Take one at onset of severe headache; may repeat once in 2 hours if headache persists; max of 2 in 24 hours or 4/week. 07/02/18  Yes [provider]  guanFACINE (INTUNIV) 2 MG TB24 ER tablet Take 1 tablet by mouth daily. 03/10/20  Yes [provider]  ketorolac (TORADOL) 30 MG/ML injection Inject 30mg  (40ml) once every 12 hours as needed for severe headache; no other NSAIDs 01/14/19  Yes [provider]  metoCLOPramide (REGLAN) 5 MG tablet metoclopramide 5 mg tablet   Yes [provider]  ondansetron (ZOFRAN) 4 MG tablet Take 4 mg by mouth every 8 (eight) hours as needed for nausea.  12/10/13  Yes [provider]  pantoprazole (PROTONIX) 40 MG tablet Take 1 tablet (40 mg total) by mouth daily. 09/27/20  Yes Virgel Manifold, MD  propranolol (INDERAL) 10 MG tablet Take one tab as needed for panic attacks 05/12/19  Yes Arfeen, Arlyce Harman, MD  tiZANidine (ZANAFLEX) 4 MG tablet Take 4 mg by mouth every 6 (six) hours as needed for muscle spasms.   Yes [provider]  Vitamin D, Ergocalciferol, (DRISDOL) 1.25 MG (50000 UT) CAPS capsule TK 1 C PO ONCE A WEEK FOR 12 DOSES 01/14/19  Yes [provider]    Family History  Problem Relation Age of Onset  . Colon cancer Maternal Grandmother   . Other Maternal Grandfather        pituitary tumor  . Diabetes Paternal Grandmother   . Heart Problems Paternal Grandfather        A fib, CHF  . Colon polyps Neg Hx   . Esophageal cancer Neg Hx   . Rectal cancer Neg Hx   . Stomach cancer Neg Hx      Social History   Tobacco Use  . Smoking status: Never Smoker  . Smokeless tobacco: Never Used  Vaping Use  . Vaping Use: Never used  Substance Use Topics  . Alcohol use: No  . Drug use: No    Allergies as of 09/27/2020 - Review Complete 09/27/2020  Allergen Reaction Noted  . Cyanoacrylate Rash 12/18/2013  . Clavulanic acid Nausea And Vomiting 10/30/2013  . Compazine [prochlorperazine edisylate]  Other (See Comments) 12/10/2011  . Imipramine Rash 01/03/2014  . Ketamine Rash and Other (See Comments) 01/06/2011  . Prednisone Other (See Comments) 10/12/2016  . Topamax [topiramate] Hives and Rash 12/10/2011  . Haldol [haloperidol] Other (See Comments) 08/08/2016  . Adhesive [tape] Rash 12/04/2013  . Flagyl [metronidazole] Rash 10/30/2013  . Gold Rash 02/27/2019  . Other Rash 12/18/2013    Review of Systems:    All systems reviewed and negative except where noted in HPI.   Physical Exam:  BP 112/78   Pulse 93   Temp 98.3 F (36.8 C) (Oral)   Ht 5\' 7"  (1.702 m)   Wt 196 lb (88.9 kg)   BMI 30.70 kg/m  No LMP recorded. Psych:  Alert and cooperative. Normal mood  and affect. General:   Alert,  Well-developed, well-nourished, pleasant and cooperative in NAD Head:  Normocephalic and atraumatic. Eyes:  Sclera clear, no icterus.   Conjunctiva pink. Ears:  Normal auditory acuity. Nose:  No deformity, discharge, or lesions. Mouth:  No deformity or lesions,oropharynx pink & moist. Neck:  Supple; no masses or thyromegaly. Abdomen:  Normal bowel sounds.  No bruits.  Soft, non-tender and non-distended without masses, hepatosplenomegaly or hernias noted.  No guarding or rebound tenderness.    Msk:  Symmetrical without gross deformities. Good, equal movement & strength bilaterally. Pulses:  Normal pulses noted. Extremities:  No clubbing or edema.  No cyanosis. Neurologic:  Alert and oriented x3;  grossly normal neurologically. Skin:  Intact without significant lesions or rashes. No jaundice. Lymph Nodes:  No significant cervical adenopathy. Psych:  Alert and cooperative. Normal mood and affect.   Labs: CBC    Component Value Date/Time   WBC 12.5 (H) 12/10/2013 0034   RBC 3.79 (L) 12/10/2013 0034   HGB 14.3 10/14/2016 1908   HCT 42.0 10/14/2016 1908   PLT 303 12/10/2013 0034   MCV 88.4 12/10/2013 0034   MCH 29.3 12/10/2013 0034   MCHC 33.1 12/10/2013 0034   RDW 12.9 12/10/2013  0034   LYMPHSABS 2.9 12/09/2013 0731   MONOABS 0.7 12/09/2013 0731   EOSABS 0.2 12/09/2013 0731   BASOSABS 0.0 12/09/2013 0731   CMP     Component Value Date/Time   NA 143 10/14/2016 1908   K 3.8 10/14/2016 1908   CL 106 10/14/2016 1908   CO2 26 06/24/2015 1049   GLUCOSE 87 10/14/2016 1908   BUN 9 10/14/2016 1908   CREATININE 0.80 10/14/2016 1908   CALCIUM 9.5 06/24/2015 1049   PROT 5.8 (L) 12/10/2013 0034   ALBUMIN 3.3 (L) 12/10/2013 0034   AST 26 12/10/2013 0034   ALT 37 (H) 12/10/2013 0034   ALKPHOS 72 12/10/2013 0034   BILITOT 0.5 12/10/2013 0034   GFRNONAA >90 12/10/2013 0034   GFRAA >90 12/10/2013 0034    Imaging Studies: No results found.  Assessment and Plan:   Angela [name] is a 25 y.o. y/o female has been referred for GERD extensive work-up by GI including EGD, colonoscopy, pH monitoring, gastric emptying study, with baseline  Her anxiety is contributing to some of her symptoms  However, symptoms were well controlled on Dexilant which is now not covered by insurance as per the patient  Nexium is not helping and therefore we will try Protonix  Patient has been on PPI chronically and unable to discontinue it due to return of symptoms and this has been documented by her previous GIs as well  (Risks of PPI use were discussed with patient including bone loss, C. Diff diarrhea, pneumonia, infections, CKD, electrolyte abnormalities.  Pt. Verbalizes understanding and chooses to continue the medication.)  She is not having true diarrhea but rather just 1 loose stool a day.  Start Metamucil to help bulk stool  (Risks of PPI use were discussed with patient including bone loss, C. Diff diarrhea, pneumonia, infections, CKD, electrolyte abnormalities.  Pt. Verbalizes understanding and chooses to continue the medication.)   Dr Angela [name]  Speech recognition software was used to dictate the above note.

## 2020-10-07 ENCOUNTER — Other Ambulatory Visit: Payer: Self-pay

## 2020-10-07 MED ORDER — ONDANSETRON HCL 4 MG PO TABS: 4.0000 mg | ORAL_TABLET | Freq: Three times a day (TID) | ORAL | 0 refills | Status: AC | PRN

## 2020-10-07 MED ORDER — PANTOPRAZOLE SODIUM 40 MG PO TBEC: 40.0000 mg | DELAYED_RELEASE_TABLET | Freq: Every day | ORAL | 3 refills | Status: AC

## 2020-11-01 ENCOUNTER — Other Ambulatory Visit: Payer: Self-pay

## 2020-11-01 ENCOUNTER — Emergency Department: Payer: Managed Care, Other (non HMO)

## 2020-11-01 ENCOUNTER — Emergency Department
Admission: EM | Admit: 2020-11-01 | Discharge: 2020-11-01 | Disposition: A | Discharge status: Home / Self Care | Payer: Managed Care, Other (non HMO) | Attending: Emergency Medicine | Admitting: Emergency Medicine

## 2020-11-01 DIAGNOSIS — K529 Noninfective gastroenteritis and colitis, unspecified: Secondary | ICD-10-CM

## 2020-11-01 DIAGNOSIS — R55 Syncope and collapse: Secondary | ICD-10-CM

## 2020-11-01 DIAGNOSIS — E860 Dehydration: Secondary | ICD-10-CM

## 2020-11-01 DIAGNOSIS — R072 Precordial pain: Secondary | ICD-10-CM | POA: Diagnosis not present

## 2020-11-01 DIAGNOSIS — F419 Anxiety disorder, unspecified: Secondary | ICD-10-CM | POA: Diagnosis not present

## 2020-11-01 DIAGNOSIS — R197 Diarrhea, unspecified: Secondary | ICD-10-CM | POA: Diagnosis not present

## 2020-11-01 DIAGNOSIS — Z20822 Contact with and (suspected) exposure to covid-19: Secondary | ICD-10-CM | POA: Diagnosis not present

## 2020-11-01 LAB — CBC WITH DIFFERENTIAL/PLATELET
Abs Immature Granulocytes: 0.01 10*3/uL (ref 0.00–0.07)
Basophils Absolute: 0.1 10*3/uL (ref 0.0–0.1)
Basophils Relative: 1 %
Eosinophils Absolute: 0.2 10*3/uL (ref 0.0–0.5)
Eosinophils Relative: 2 %
HCT: 40.4 % (ref 36.0–46.0)
Hemoglobin: 13.8 g/dL (ref 12.0–15.0)
Immature Granulocytes: 0 %
Lymphocytes Relative: 53 %
Lymphs Abs: 3.7 10*3/uL (ref 0.7–4.0)
MCH: 29.9 pg (ref 26.0–34.0)
MCHC: 34.2 g/dL (ref 30.0–36.0)
MCV: 87.4 fL (ref 80.0–100.0)
Monocytes Absolute: 0.5 10*3/uL (ref 0.1–1.0)
Monocytes Relative: 7 %
Neutro Abs: 2.6 10*3/uL (ref 1.7–7.7)
Neutrophils Relative %: 37 %
Platelets: 352 10*3/uL (ref 150–400)
RBC: 4.62 MIL/uL (ref 3.87–5.11)
RDW: 12.5 % (ref 11.5–15.5)
WBC: 6.9 10*3/uL (ref 4.0–10.5)
nRBC: 0 % (ref 0.0–0.2)

## 2020-11-01 LAB — BASIC METABOLIC PANEL
Anion gap: 5 (ref 5–15)
BUN: 8 mg/dL (ref 6–20)
CO2: 26 mmol/L (ref 22–32)
Calcium: 9 mg/dL (ref 8.9–10.3)
Chloride: 111 mmol/L (ref 98–111)
Creatinine, Ser: 0.8 mg/dL (ref 0.44–1.00)
GFR, Estimated: >60 mL/min (ref >60)
Glucose, Bld: 117 mg/dL — ABNORMAL HIGH (ref 70–99)
Potassium: 3.8 mmol/L (ref 3.5–5.1)
Sodium: 142 mmol/L (ref 135–145)

## 2020-11-01 LAB — COMPREHENSIVE METABOLIC PANEL
ALT: 11 U/L (ref 0–44)
AST: 13 U/L — ABNORMAL LOW (ref 15–41)
Albumin: 3.9 g/dL (ref 3.5–5.0)
Alkaline Phosphatase: 61 U/L (ref 38–126)
Anion gap: 30 — ABNORMAL HIGH (ref 5–15)
BUN: 9 mg/dL (ref 6–20)
CO2: 22 mmol/L (ref 22–32)
Calcium: 7.1 mg/dL — ABNORMAL LOW (ref 8.9–10.3)
Chloride: 98 mmol/L (ref 98–111)
Creatinine, Ser: 0.8 mg/dL (ref 0.44–1.00)
GFR, Estimated: >60 mL/min (ref >60)
Glucose, Bld: 102 mg/dL — ABNORMAL HIGH (ref 70–99)
Potassium: 3.2 mmol/L — ABNORMAL LOW (ref 3.5–5.1)
Sodium: 150 mmol/L — ABNORMAL HIGH (ref 135–145)
Total Bilirubin: 0.5 mg/dL (ref 0.3–1.2)
Total Protein: 6.4 g/dL — ABNORMAL LOW (ref 6.5–8.1)

## 2020-11-01 LAB — RESP PANEL BY RT-PCR (FLU A&B, COVID) ARPGX2
Influenza A by PCR: NEGATIVE
Influenza B by PCR: NEGATIVE
SARS Coronavirus 2 by RT PCR: NEGATIVE

## 2020-11-01 LAB — HCG, QUANTITATIVE, PREGNANCY: hCG, Beta Chain, Quant, S: <1 m[IU]/mL (ref <5)

## 2020-11-01 LAB — D-DIMER, QUANTITATIVE: D-Dimer, Quant: <0.27 ug/mL-FEU (ref 0.00–0.50)

## 2020-11-01 LAB — TROPONIN I (HIGH SENSITIVITY): Troponin I (High Sensitivity): 3 ng/L (ref <18)

## 2020-11-01 LAB — MAGNESIUM: Magnesium: 2.2 mg/dL (ref 1.7–2.4)

## 2020-11-01 MED ORDER — LACTATED RINGERS IV BOLUS
1000.0000 mL | Freq: Once | INTRAVENOUS | Status: AC
  Administered 2020-11-01: 1000 mL via INTRAVENOUS

## 2020-11-01 MED ORDER — ONDANSETRON HCL 4 MG/2ML IJ SOLN
4.0000 mg | Freq: Once | INTRAMUSCULAR | Status: AC
  Administered 2020-11-01: 4 mg via INTRAVENOUS
  Filled 2020-11-01: qty 2

## 2020-11-01 NOTE — ED Notes (Signed)
Per Dr Tamala Julian, collect bmp after second bolus finished

## 2020-11-01 NOTE — ED Provider Notes (Addendum)
[facility] Emergency Department Provider Note  ____________________________________________   Event Date/Time   First MD Initiated Contact with Patient 11/01/20 1113     (approximate)  I have reviewed the triage vital signs and the nursing notes.   HISTORY  Chief Complaint No chief complaint on file.   HPI Angela [name] is a 25 y.o. female reported past medical history of chronic diarrhea who presents via EMS from her place after she had a syncopal episode.  She was currently sitting at a desk when she passed out.  Unclear how she got to the floor although when EMS arrived she was supine on the floor looking little pale.  Patient complaining of little bit of substernal pain and noted she had diarrhea for at least 2 months.  She is also complaining of some anxiety.  She denies any headache, earache, sore throat, cough, fevers, chills, urinary symptoms, abdominal pain, back pain, rash or extremity pain.  No recent falls or injuries.  She denies illicit drug use, tobacco abuse or EtOH use.  States she passed out but did not feel as bad several months ago.  No other acute concerns at this time.          No past medical history on file.  There are no problems to display for this patient.     Prior to Admission medications   Not on File    Allergies Haldol [haloperidol]  No family history on file.  Social History    Review of Systems  Review of Systems  Constitutional: Positive for malaise/fatigue. Negative for chills and fever.  HENT: Negative for sore throat.   Eyes: Negative for pain.  Respiratory: Negative for cough and stridor.   Cardiovascular: Positive for chest pain.  Gastrointestinal: Positive for diarrhea ( x2 months, not different over last couple days) and nausea. Negative for vomiting.  Genitourinary: Negative for dysuria.  Musculoskeletal: Negative for myalgias.  Skin: Negative for rash.  Neurological: Positive for loss of  consciousness. Negative for seizures and headaches.  Psychiatric/Behavioral: Negative for suicidal ideas.  All other systems reviewed and are negative.     ____________________________________________   PHYSICAL EXAM:  VITAL SIGNS: ED Triage Vitals  Enc Vitals Group     BP --      Pulse --      Resp 11/01/20 1112 18     Temp --      Temp src --      SpO2 11/01/20 1112 98 %     Weight --      Height --      Head Circumference --      Peak Flow --      Pain Score 11/01/20 1113 10     Pain Loc --      Pain Edu? --      Excl. in [place]? --    Vitals:   11/01/20 1300 11/01/20 1400  BP: (!) 103/58 104/60  Pulse: (!) 58 (!) 53  Resp: 17 12  Temp:    SpO2: 100% 100%   Physical Exam Vitals and nursing note reviewed.  Constitutional:      General: She is not in acute distress.    Appearance: She is well-developed.  HENT:     Head: Normocephalic and atraumatic.     Right Ear: External ear normal.     Left Ear: External ear normal.     Mouth/Throat:     Mouth: Mucous membranes are dry.  Eyes:  Conjunctiva/sclera: Conjunctivae normal.  Cardiovascular:     Rate and Rhythm: Normal rate and regular rhythm.     Heart sounds: No murmur heard.   Pulmonary:     Effort: Pulmonary effort is normal. No respiratory distress.     Breath sounds: Normal breath sounds.  Abdominal:     Palpations: Abdomen is soft.     Tenderness: There is no abdominal tenderness.  Musculoskeletal:     Cervical back: Neck supple.  Skin:    General: Skin is warm and dry.     Capillary Refill: Capillary refill takes 2 to 3 seconds.  Neurological:     Mental Status: She is alert and oriented to person, place, and time.  Psychiatric:        Mood and Affect: Mood normal.      ____________________________________________   LABS (all labs ordered are listed, but only abnormal results are displayed)  Labs Reviewed  COMPREHENSIVE METABOLIC PANEL - Abnormal; Notable for the following components:       Result Value   Sodium 150 (*)    Potassium 3.2 (*)    Glucose, Bld 102 (*)    Calcium 7.1 (*)    Total Protein 6.4 (*)    AST 13 (*)    Anion gap 30 (*)    All other components within normal limits  BASIC METABOLIC PANEL - Abnormal; Notable for the following components:   Glucose, Bld 117 (*)    All other components within normal limits  RESP PANEL BY RT-PCR (FLU A&B, COVID) ARPGX2  GASTROINTESTINAL PANEL BY PCR, STOOL (REPLACES STOOL CULTURE)  CBC WITH DIFFERENTIAL/PLATELET  HCG, QUANTITATIVE, PREGNANCY  D-DIMER, QUANTITATIVE  MAGNESIUM  TROPONIN I (HIGH SENSITIVITY)   ____________________________________________  EKG  Sinus bradycardia with ventricular rate of 55, normal axis, unremarkable intervals with significant artifact versus nonspecific change in lead II and lateral leads.  No other clearance of acute ischemia. ____________________________________________  RADIOLOGY  ED MD interpretation: No focal consolidation, effusion, edema, thorax any other clear acute intrathoracic process.  Official radiology report(s): DG Chest Portable 1 View  Result Date: 11/01/2020 CLINICAL DATA:  Syncope EXAM: PORTABLE CHEST 1 VIEW COMPARISON:  None. FINDINGS: Low lung volumes. Minimal density at the lung bases probably reflects atelectasis. No significant pleural effusion. Cardiomediastinal contours are within normal limits for technique. Cholecystectomy clips. IMPRESSION: Low lung volumes with minimal bibasilar atelectasis. Electronically Signed   By: Macy Mis M.D.   On: 11/01/2020 11:53    ____________________________________________   PROCEDURES  Procedure(s) performed (including Critical Care):  .1-3 Lead EKG Interpretation Performed by: Lucrezia Starch, MD Authorized by: Lucrezia Starch, MD     ECG rate assessment: bradycardic     Rhythm: sinus rhythm     Ectopy: none     Conduction: normal       ____________________________________________   INITIAL  IMPRESSION / ASSESSMENT AND PLAN / ED COURSE      Patient presents with above to history exam after a syncopal episode.  On arrival she was complaining of little bit of chest discomfort but otherwise denied any clear associated acute symptoms although endorses chronic diarrhea.  No obvious evidence of trauma on exam she is nonfocal neuro exam.  Low suspicion for toxic ingestion or CVA.  Initial differential includes possible arrhythmia, PE, dehydration, metabolic derangements and anemia.  Chest x-ray is unremarkable.  EKG slightly bradycardic but otherwise no evidence of ischemia.  In addition troponin is nonelevated.  Low suspicion for PE as D-dimer is  undetectable.  hCG is negative.  COVID and flu is negative.  Magnesium WNL.  CBC without evidence of leukocytosis or anemia.  CMP initially with a sodium of 150, K of 3.2 and an anion gap of 30.  Suspect this is likely secondary to bicarb losses from reported recent chronic diarrhea.    Patient treated with 2 L of IV fluids and Zofran.  Repeat BMP shows sodium of 142, K of 3.8 and anion gap of 5.  Patient is not orthostatic on check of orthostatic blood pressures.  Suspect patient likely syncopized secondary to dehydration in setting of chronic GI losses.  On my reassessment she is now tolerating p.o.  She declined Imodium stating stating this is not helped in the past.  Advised her that she must follow-up with her gastroenterologist as soon as she is able to as she is at high risk for repeat episodes of dehydration and associated complications including syncope she does not get her diarrhea under control.  She is agreeable to this plan.  Discharged stable condition.  Strict return precautions advised and discussed.   ____________________________________________   FINAL CLINICAL IMPRESSION(S) / ED DIAGNOSES  Final diagnoses:  Dehydration  Syncope, unspecified syncope type  Chronic diarrhea    Medications  lactated ringers bolus 1,000 mL (0  mLs Intravenous Stopped 11/01/20 1331)  lactated ringers bolus 1,000 mL (0 mLs Intravenous Stopped 11/01/20 1435)  ondansetron (ZOFRAN) injection 4 mg (4 mg Intravenous Given 11/01/20 1438)     ED Discharge Orders    None       Note:  This document was prepared using Dragon voice recognition software and may include unintentional dictation errors.   Lucrezia Starch, MD 11/01/20 1540    Lucrezia Starch, MD 11/01/20 [phone]

## 2020-11-01 NOTE — ED Notes (Signed)
RN to bedside to find pt using restroom with mother assistance. Continues to reports dizziness

## 2020-11-01 NOTE — ED Notes (Signed)
Pt c/o chest pain, nausea and over all not feeling well. Pt responsive to verbal stimuli. NAD noted. RR even and unlabored. Stretcher locked in lowest position, call bell in reach.

## 2020-11-01 NOTE — ED Triage Notes (Signed)
PT arrived via [facility] from place of employment with c/o syncopal episode. Passed out at desk, found by EMS supine on floor, pale, CP and upper ab pain. Constant diarrhea x2 months. Pt has hx anxiety  HR 54 117/82 97% RA CBG 109  Allergies - haldol

## 2020-11-24 ENCOUNTER — Encounter: Payer: Self-pay | Admitting: Obstetrics and Gynecology

## 2020-11-24 ENCOUNTER — Other Ambulatory Visit: Payer: Self-pay

## 2020-11-24 ENCOUNTER — Ambulatory Visit (INDEPENDENT_AMBULATORY_CARE_PROVIDER_SITE_OTHER): Payer: 59 | Admitting: Obstetrics and Gynecology

## 2020-11-24 VITALS — BP 120/83 | HR 76 | Ht 67.0 in | Wt 184.2 lb

## 2020-11-24 DIAGNOSIS — N898 Other specified noninflammatory disorders of vagina: Secondary | ICD-10-CM

## 2020-11-24 NOTE — Progress Notes (Signed)
HPI:      [name] is a 25 y.o. No obstetric history on file. who LMP was Patient's last menstrual period was 11/19/2020.  Subjective:   She presents today with concern regarding something that she has palpated inside vagina.  She states that she feels a posterior lateral mass on the right side which is like a tubular structure that has not been there before.  She says its been present for about 2 weeks.  It is nontender.  It does not interfere with bowel movements or urination. Patient is not currently sexually active.  Not using anything for birth control.  Having normal regular cycles.  She does state that her cycle is regular every month and she has approximately a 4-day menses.  Her cramps are "bad" despite the fact that she does not have heavy bleeding.  She does systematically take ibuprofen/Motrin.    Hx: The following portions of the patient's history were reviewed and updated as appropriate:             She  has a past medical history of Acne, ADHD (attention deficit hyperactivity disorder), Allergy, Anxiety, Arthritis, Asthma, Biliary dyskinesia (11/2013), Chronic constipation, Closed head injury (2012), Cluster headaches, Complication of anesthesia, Delayed gastric emptying, Depression, GERD (gastroesophageal reflux disease), Heart murmur, Muscle spasms of neck, Pulmonary artery stenosis, and Sleep apnea. She does not have any pertinent problems on file. She  has a past surgical history that includes Hymenectomy (12/21/2006); laparoscopic appendectomy (N/A, 09/28/2013); Robotic assisted laparoscopic ovarian cystectomy (Right, 01/20/2011); Esophagogastroduodenoscopy (egd) with propofol (07/26/2012); Nasal septum surgery (2009); Laparoscopic cholecystectomy (12/09/2013); Tonsillectomy and adenoidectomy (1999); Cholecystectomy (N/A, 12/09/2013); Esophageal manometry (N/A, 07/26/2015); 24 hour ph study (N/A, 07/26/2015); biopsy (06/04/2018); and Flexible sigmoidoscopy (N/A, 06/04/2018). Her family  history includes Colon cancer in her maternal grandmother; Diabetes in her paternal grandmother; Heart Problems in her paternal grandfather; Other in her maternal grandfather. She  reports that she has never smoked. She has never used smokeless tobacco. She reports that she does not drink alcohol and does not use drugs. She has a current medication list which includes the following prescription(s): cetirizine, clonazepam, epinephrine, fremanezumab-vfrm, frovatriptan, ketorolac, metoclopramide, ondansetron, pantoprazole, propranolol, tizanidine, vitamin d (ergocalciferol), and guanfacine. She is allergic to cyanoacrylate, clavulanic acid, compazine [prochlorperazine edisylate], imipramine, ketamine, prednisone, topamax [topiramate], haldol [haloperidol], haldol [haloperidol], adhesive [tape], flagyl [metronidazole], gold, and other.       Review of Systems:  Review of Systems  Constitutional: Denied constitutional symptoms, night sweats, recent illness, fatigue, fever, insomnia and weight loss.  Eyes: Denied eye symptoms, eye pain, photophobia, vision change and visual disturbance.  Ears/Nose/Throat/Neck: Denied ear, nose, throat or neck symptoms, hearing loss, nasal discharge, sinus congestion and sore throat.  Cardiovascular: Denied cardiovascular symptoms, arrhythmia, chest pain/pressure, edema, exercise intolerance, orthopnea and palpitations.  Respiratory: Denied pulmonary symptoms, asthma, pleuritic pain, productive sputum, cough, dyspnea and wheezing.  Gastrointestinal: Denied, gastro-esophageal reflux, melena, nausea and vomiting.  Genitourinary: See HPI for additional information.  Musculoskeletal: Denied musculoskeletal symptoms, stiffness, swelling, muscle weakness and myalgia.  Dermatologic: Denied dermatology symptoms, rash and scar.  Neurologic: Denied neurology symptoms, dizziness, headache, neck pain and syncope.  Psychiatric: Denied psychiatric symptoms, anxiety and depression.   Endocrine: Denied endocrine symptoms including hot flashes and night sweats.   Meds:   Current Outpatient Medications on File Prior to Visit  Medication Sig Dispense Refill   cetirizine (ZYRTEC) 10 MG tablet Take 10 mg by mouth at bedtime.     clonazePAM (KLONOPIN) 0.25 MG  disintegrating tablet 0.25 mg 2 (two) times daily.     EPINEPHrine (EPIPEN 2-PAK) 0.3 mg/0.3 mL IJ SOAJ injection Inject 0.3 mLs (0.3 mg total) into the muscle as needed. (Patient taking differently: Inject 0.3 mg into the muscle as needed (severe allergic reaction).) 2 Device 1   Fremanezumab-vfrm 225 MG/1.5ML SOSY Inject 225 mg into the skin every 30 (thirty) days.      frovatriptan (FROVA) 2.5 MG tablet Take one at onset of severe headache; may repeat once in 2 hours if headache persists; max of 2 in 24 hours or 4/week.     ketorolac (TORADOL) 30 MG/ML injection Inject 30mg  (87ml) once every 12 hours as needed for severe headache; no other NSAIDs     metoCLOPramide (REGLAN) 5 MG tablet metoclopramide 5 mg tablet     ondansetron (ZOFRAN) 4 MG tablet Take 1 tablet (4 mg total) by mouth every 8 (eight) hours as needed for nausea. 20 tablet 0   pantoprazole (PROTONIX) 40 MG tablet Take 1 tablet (40 mg total) by mouth daily. 90 tablet 3   propranolol (INDERAL) 10 MG tablet Take one tab as needed for panic attacks 30 tablet 0   tiZANidine (ZANAFLEX) 4 MG tablet Take 4 mg by mouth every 6 (six) hours as needed for muscle spasms.     Vitamin D, Ergocalciferol, (DRISDOL) 1.25 MG (50000 UT) CAPS capsule TK 1 C PO ONCE A WEEK FOR 12 DOSES     guanFACINE (INTUNIV) 2 MG TB24 ER tablet Take 1 tablet by mouth daily. (Patient not taking: Reported on 11/24/2020)     No current facility-administered medications on file prior to visit.       [facility] - 11/24/20 0737       Pregnancy Intention Screening   Does the patient want to become pregnant in the next year? No    Does the patient's partner want to become pregnant in the next year?  No    Would the patient like to discuss contraceptive options today? No      Contraception Wrap Up   Current Method No Contraceptive Precautions    End Method No Contraception Precautions    Contraception Counseling Provided No            The pregnancy intention screening data noted above was reviewed. Potential methods of contraception were discussed. The patient elected to proceed with Abstinence.     Objective:     Vitals:   11/24/20 0733  BP: 120/83  Pulse: 76   Filed Weights   11/24/20 0733  Weight: 184 lb 3.2 oz (83.6 kg)              Physical examination   Pelvic:   Vulva: Normal appearance.  No lesions.  Vagina: No lesions or abnormalities noted.  Support: Normal pelvic support.  Urethra No masses tenderness or scarring.  Meatus Normal size without lesions or prolapse.  Cervix: Normal appearance.  No lesions.  Anus: Normal exam.  No lesions.  Perineum: Normal exam.  No lesions.        Bimanual   Uterus: Normal size.  Non-tender.  Mobile.  AV.  Adnexae: No masses.  Non-tender to palpation.  Cul-de-sac: Negative for abnormality.   No abnormalities found on 2 exams.  Nothing unusual palpated no evidence of mass.  Her cervix does deviate slightly to the right and posterior.  Assessment:    No obstetric history on file. Patient Active Problem List   Diagnosis Date Noted   Anxiety 03/01/2020  Acute pain of left knee 02/10/2020   Major depressive disorder, recurrent episode, moderate ([place]) 08/15/2019   Lipoma 05/06/2019   Pain in joint of right ankle 03/17/2019   Strain of peroneal tendon 03/05/2019   Vitamin D deficiency 01/10/2019   Abdominal pain 06/04/2018   Lipoma of colon    Pain of right heel 12/12/2017   Abnormal auditory perception of both ears 10/16/2016   Dizziness 10/16/2016   Imbalance 10/16/2016   Hypertrophy of bone, right ankle and foot 08/03/2016   Pain in toe of right foot 08/03/2016   Dry eye 10/29/2015   Dry mouth 10/29/2015    Pain, joint, multiple sites 10/29/2015   Neck pain 10/14/2015   Pain syndrome, chronic 10/14/2015   Dysphagia    Esophageal reflux    Gastroesophageal reflux disease with esophagitis 03/10/2015   Major depression, recurrent, chronic ([place]) 02/09/2015   GAD (generalized anxiety disorder) 02/09/2015   ADHD (attention deficit hyperactivity disorder) 02/09/2015   Rhinitis, allergic 02/06/2015   Depression 02/03/2015   Paranoia ([place]) 02/03/2015   Psychophysiological insomnia 07/14/2014   Chronic tension-type headache, intractable 01/29/2014   Gastroparesis 12/10/2013   Cholecystitis, chronic 12/09/2013   Symptomatic biliary dyskinesia 12/02/2013   Abdominal pain, unspecified site 10/30/2013   Nausea alone 10/30/2013   Constipation 10/30/2013   Postop check 10/14/2013   Postoperative wound infection 10/06/2013   Acute appendicitis 09/28/2013   Migraine variant 10/30/2012   Tension headache 10/30/2012   Bilateral occipital neuralgia 10/30/2012   Pulmonary artery abnormality 10/24/2012   Tachycardia 10/24/2012   GERD (gastroesophageal reflux disease) 07/15/2012   Skin sensation disturbance 03/11/2012     1. Vaginal mass     After further discussion and examination I believe that the patient is palpating her cervix slightly deviated to the right and posterior.  No abnormalities found.   Plan:            1.  Reassured patient regarding this finding as noted above.  If something changes or she begins having pain or other difficulty I have asked her to return for reexamination.  2.  Patient to schedule for annual examination and Pap smear. Orders No orders of the defined types were placed in this encounter.   No orders of the defined types were placed in this encounter.     F/U  Return for Annual Physical. I spent 32 minutes involved in the care of this patient preparing to see the patient by obtaining and reviewing her medical history (including labs, imaging tests and prior  procedures), documenting clinical information in the electronic health record (EHR), counseling and coordinating care plans, writing and sending prescriptions, ordering tests or procedures and directly communicating with the patient by discussing pertinent items from her history and physical exam as well as detailing my assessment and plan as noted above so that she has an informed understanding.  All of her questions were answered.  Finis Bud, M.D. 11/24/2020 8:01 AM

## 2020-12-28 ENCOUNTER — Encounter: Payer: Self-pay | Admitting: *Deleted

## 2020-12-28 ENCOUNTER — Ambulatory Visit: Payer: 59 | Admitting: Gastroenterology

## 2021-03-23 ENCOUNTER — Encounter: Payer: Self-pay | Admitting: Dermatology

## 2021-03-23 ENCOUNTER — Other Ambulatory Visit: Payer: Self-pay

## 2021-03-23 ENCOUNTER — Ambulatory Visit: Payer: Managed Care, Other (non HMO) | Admitting: Dermatology

## 2021-03-23 DIAGNOSIS — Z86018 Personal history of other benign neoplasm: Secondary | ICD-10-CM

## 2021-03-23 DIAGNOSIS — D1720 Benign lipomatous neoplasm of skin and subcutaneous tissue of unspecified limb: Secondary | ICD-10-CM

## 2021-03-23 DIAGNOSIS — D171 Benign lipomatous neoplasm of skin and subcutaneous tissue of trunk: Secondary | ICD-10-CM

## 2021-03-23 DIAGNOSIS — D485 Neoplasm of uncertain behavior of skin: Secondary | ICD-10-CM

## 2021-03-23 DIAGNOSIS — Z1283 Encounter for screening for malignant neoplasm of skin: Secondary | ICD-10-CM

## 2021-03-23 DIAGNOSIS — D1739 Benign lipomatous neoplasm of skin and subcutaneous tissue of other sites: Secondary | ICD-10-CM

## 2021-03-23 DIAGNOSIS — D2271 Melanocytic nevi of right lower limb, including hip: Secondary | ICD-10-CM | POA: Diagnosis not present

## 2021-03-23 DIAGNOSIS — D1800 Hemangioma unspecified site: Secondary | ICD-10-CM

## 2021-03-23 DIAGNOSIS — D229 Melanocytic nevi, unspecified: Secondary | ICD-10-CM

## 2021-03-23 DIAGNOSIS — L719 Rosacea, unspecified: Secondary | ICD-10-CM

## 2021-03-23 DIAGNOSIS — D225 Melanocytic nevi of trunk: Secondary | ICD-10-CM

## 2021-03-23 DIAGNOSIS — D224 Melanocytic nevi of scalp and neck: Secondary | ICD-10-CM

## 2021-03-23 DIAGNOSIS — D2262 Melanocytic nevi of left upper limb, including shoulder: Secondary | ICD-10-CM

## 2021-03-23 DIAGNOSIS — L578 Other skin changes due to chronic exposure to nonionizing radiation: Secondary | ICD-10-CM

## 2021-03-23 MED ORDER — CLINDAMYCIN PHOSPHATE 1 % EX LOTN: TOPICAL_LOTION | CUTANEOUS | 2 refills | Status: DC

## 2021-03-23 NOTE — Progress Notes (Signed)
New Patient Visit  Subjective  Angela [name] is a 25 y.o. female who presents for the following: Skin Check.  Patient here today for TBSE. She has a few moles that are bothersome to her. She has a history of Dysplastic nevi. She has a couple of tags of the right axilla and upper thigh she would like removed due to irritation, and nodules on her legs.  She has very sensitive skin and can't tolerate any sunscreens including Cetaphil, CeraVe, and La Roche Posay.  She also has persistent redness on her cheeks, and has had laser treatments for it in the past.  The following portions of the chart were reviewed this encounter and updated as appropriate:       Review of Systems:  No other skin or systemic complaints except as noted in HPI or Assessment and Plan.  Objective  Well appearing patient in no apparent distress; mood and affect are within normal limits.  A full examination was performed including scalp, head, eyes, ears, nose, lips, neck, chest, axillae, abdomen, back, buttocks, bilateral upper extremities, bilateral lower extremities, hands, feet, fingers, toes, fingernails, and toenails. All findings within normal limits unless otherwise noted below.  L post med shoulder 3.0 mm brown macule  Left Abdomen 72mm med dark brown flat papule  R lower ant thigh 2 mm two tone brown papule  R lower hip 4 x 3 mm med brown flat papule  Vertex scalp, hairline, post auricular Flesh papules.       Right Ant  Axilla Sup 57mm flesh brown papule   R ant axilla inferior 60mm dark brown two tone papule     Right Upper Medial Thigh 42mm flesh brown papule  spinal mid back, R ant thigh, R upper knee, R lat thigh Rubbery nodules.  face Mild erythema cheeks and nose.    Assessment & Plan  Skin cancer screening performed today.  Actinic Damage - chronic, secondary to cumulative UV radiation exposure/sun exposure over time - diffuse scaly erythematous macules with underlying  dyspigmentation - Recommend daily broad spectrum sunscreen SPF 30+ to sun-exposed areas, reapply every 2 hours as needed.  - Recommend staying in the shade or wearing long sleeves, sun glasses (UVA+UVB protection) and wide brim hats (4-inch brim around the entire circumference of the hat). - Call for new or changing lesions.  Melanocytic Nevi - Tan-brown and/or pink-flesh-colored symmetric macules and papules - Benign appearing on exam today - Observation - Call clinic for new or changing moles - Recommend daily use of broad spectrum spf 30+ sunscreen to sun-exposed areas.   History of Dysplastic Nevi - No evidence of recurrence today - Recommend regular full body skin exams - Recommend daily broad spectrum sunscreen SPF 30+ to sun-exposed areas, reapply every 2 hours as needed.  - Call if any new or changing lesions are noted between office visits  Nevus (5) R lower ant thigh; R lower hip; Left Abdomen; L post med shoulder; Vertex scalp, hairline, post auricular  Benign-appearing.  Observation.  Call clinic for new or changing moles.  Recommend daily use of broad spectrum spf 30+ sunscreen to sun-exposed areas.   May remove irritated moles scalp/thigh on follow-up.   Neoplasm of uncertain behavior of skin (3) Right Ant  Axilla Sup  Epidermal / dermal shaving  Lesion diameter (cm):  0.3 Informed consent: discussed and consent obtained   Patient was prepped and draped in usual sterile fashion: Area prepped with alcohol. Anesthesia: the lesion was anesthetized in a  standard fashion   Anesthetic:  1% lidocaine w/ epinephrine 1-100,000 buffered w/ 8.4% NaHCO3 Instrument used: flexible razor blade   Hemostasis achieved with: pressure, aluminum chloride and electrodesiccation   Outcome: patient tolerated procedure well   Post-procedure details: wound care instructions given   Post-procedure details comment:  Ointment and small bandage applied  Specimen 1 - Surgical  pathology Differential Diagnosis: Irritated Nevus vs other Check Margins: No 60mm flesh brown papule  R ant axilla inferior  Epidermal / dermal shaving  Lesion diameter (cm):  0.5 Informed consent: discussed and consent obtained   Patient was prepped and draped in usual sterile fashion: Area prepped with alcohol. Anesthesia: the lesion was anesthetized in a standard fashion   Anesthetic:  1% lidocaine w/ epinephrine 1-100,000 buffered w/ 8.4% NaHCO3 Instrument used: flexible razor blade   Hemostasis achieved with: pressure, aluminum chloride and electrodesiccation   Outcome: patient tolerated procedure well   Post-procedure details: wound care instructions given   Post-procedure details comment:  Ointment and small bandage applied  Specimen 2 - Surgical pathology Differential Diagnosis: Nevus r/o Dysplasia Check Margins: Yes 81mm dark brown two tone papule  Right Upper Medial Thigh  Epidermal / dermal shaving  Lesion diameter (cm):  0.4 Informed consent: discussed and consent obtained   Patient was prepped and draped in usual sterile fashion: Area prepped with alcohol. Anesthesia: the lesion was anesthetized in a standard fashion   Anesthetic:  1% lidocaine w/ epinephrine 1-100,000 buffered w/ 8.4% NaHCO3 Instrument used: flexible razor blade   Hemostasis achieved with: pressure, aluminum chloride and electrodesiccation   Outcome: patient tolerated procedure well   Post-procedure details: wound care instructions given   Post-procedure details comment:  Ointment and small bandage applied  Specimen 3 - Surgical pathology Differential Diagnosis: Irritated Nevus vs other Check Margins: No 60mm flesh brown papule  Lipoma of lower extremity, unspecified laterality spinal mid back, R ant thigh, R upper knee, R lat thigh  Benign, observation.  If become bothersome/symptomatic, discussed excision possibly with Dr Nehemiah Massed.  Discussed resulting scars.  Rosacea face  Rosacea  is a chronic progressive skin condition usually affecting the face of adults, causing redness and/or acne bumps. It is treatable but not curable. It sometimes affects the eyes (ocular rosacea) as well. It may respond to topical and/or systemic medication and can flare with stress, sun exposure, alcohol, exercise and some foods.  Daily application of broad spectrum spf 30+ sunscreen to face is recommended to reduce flares.  Discussed laser treatments, not covered by insurance. Patient has had laser treatments in the past somewhere else with improvement.   Patient has several allergies. She has had reactions to metronidazole and Cetaphil products in the past. Also Finacea irritated her skin.  Start Clindamycin lotion Apply to face qd/bid as tolerated dsp 52mL 2Rf.  Gave her several samples of mineral sunscreens for sensitive skin to try (Elta MD, Aveeno, Vanicream)  clindamycin (CLEOCIN-T) 1 % lotion - face Apply to face 1-2 times daily.  Hemangiomas - Red papules - Discussed benign nature - Observe - Call for any changes  Return in about 1 month (around 04/23/2021) for remove irritated moles, discuss acne.  IJamesetta Orleans, CMA, am acting as scribe for Brendolyn Patty, MD .  Documentation: I have reviewed the above documentation for accuracy and completeness, and I agree with the above.  Brendolyn Patty MD

## 2021-03-23 NOTE — Patient Instructions (Addendum)
Wound Care Instructions  Cleanse wound gently with soap and water once a day then pat dry with clean gauze. Apply a thing coat of Petrolatum (petroleum jelly, "Vaseline") over the wound (unless you have an allergy to this). We recommend that you use a new, sterile tube of Vaseline. Do not pick or remove scabs. Do not remove the yellow or white "healing tissue" from the base of the wound.  Cover the wound with fresh, clean, nonstick gauze and secure with paper tape. You may use Band-Aids in place of gauze and tape if the would is small enough, but would recommend trimming much of the tape off as there is often too much. Sometimes Band-Aids can irritate the skin.  You should call the office for your biopsy report after 1 week if you have not already been contacted.  If you experience any problems, such as abnormal amounts of bleeding, swelling, significant bruising, significant pain, or evidence of infection, please call the office immediately.  FOR ADULT SURGERY PATIENTS: If you need something for pain relief you may take 1 extra strength Tylenol (acetaminophen) AND 2 Ibuprofen (200mg  each) together every 4 hours as needed for pain. (do not take these if you are allergic to them or if you have a reason you should not take them.) Typically, you may only need pain medication for 1 to 3 days.     Melanoma ABCDEs  Melanoma is the most dangerous type of skin cancer, and is the leading cause of death from skin disease.  You are more likely to develop melanoma if you: Have light-colored skin, light-colored eyes, or red or blond hair Spend a lot of time in the sun Tan regularly, either outdoors or in a tanning bed Have had blistering sunburns, especially during childhood Have a close family member who has had a melanoma Have atypical moles or large birthmarks  Early detection of melanoma is key since treatment is typically straightforward and cure rates are extremely high if we catch it early.   The  first sign of melanoma is often a change in a mole or a new dark spot.  The ABCDE system is a way of remembering the signs of melanoma.  A for asymmetry:  The two halves do not match. B for border:  The edges of the growth are irregular. C for color:  A mixture of colors are present instead of an even brown color. D for diameter:  Melanomas are usually (but not always) greater than 47mm - the size of a pencil eraser. E for evolution:  The spot keeps changing in size, shape, and color.  Please check your skin once per month between visits. You can use a small mirror in front and a large mirror behind you to keep an eye on the back side or your body.   If you see any new or changing lesions before your next follow-up, please call to schedule a visit.  Please continue daily skin protection including broad spectrum sunscreen SPF 30+ to sun-exposed areas, reapplying every 2 hours as needed when you're outdoors.   Staying in the shade or wearing long sleeves, sun glasses (UVA+UVB protection) and wide brim hats (4-inch brim around the entire circumference of the hat) are also recommended for sun protection.    If you have any questions or concerns for your doctor, please call our main line at [phone] and press option 4 to reach your doctor's medical assistant. If no one answers, please leave a voicemail as directed and  we will return your call as soon as possible. Messages left after 4 pm will be answered the following business day.   You may also send us a message via MyChart. We typically respond to MyChart messages within 1-2 business days.  For prescription refills, please ask your pharmacy to contact our office. Our fax number is [phone].  If you have an urgent issue when the clinic is closed that cannot wait until the next business day, you can page your doctor at the number below.    Please note that while we do our best to be available for urgent issues outside of office hours, we  are not available 24/7.   If you have an urgent issue and are unable to reach us, you may choose to seek medical care at your doctor's office, retail clinic, urgent care center, or emergency room.  If you have a medical emergency, please immediately call 911 or go to the emergency department.  Pager Numbers  - Dr. Kowalski: [phone]  - Dr. Moye: [phone]  - Dr. Stewart: [phone]  In the event of inclement weather, please call our main line at [phone] for an update on the status of any delays or closures.  Dermatology Medication Tips: Please keep the boxes that topical medications come in in order to help keep track of the instructions about where and how to use these. Pharmacies typically print the medication instructions only on the boxes and not directly on the medication tubes.   If your medication is too expensive, please contact our office at [phone] option 4 or send us a message through MyChart.   We are unable to tell what your co-pay for medications will be in advance as this is different depending on your insurance coverage. However, we may be able to find a substitute medication at lower cost or fill out paperwork to get insurance to cover a needed medication.   If a prior authorization is required to get your medication covered by your insurance company, please allow us 1-2 business days to complete this process.  Drug prices often vary depending on where the prescription is filled and some pharmacies may offer cheaper prices.  The website www.goodrx.com contains coupons for medications through different pharmacies. The prices here do not account for what the cost may be with help from insurance (it may be cheaper with your insurance), but the website can give you the price if you did not use any insurance.  - You can print the associated coupon and take it with your prescription to the pharmacy.  - You may also stop by our office during regular business  hours and pick up a GoodRx coupon card.  - If you need your prescription sent electronically to a different pharmacy, notify our office through [facility] MyChart or by phone at [phone] option 4.  

## 2021-03-28 ENCOUNTER — Telehealth: Payer: Self-pay

## 2021-03-28 NOTE — Telephone Encounter (Signed)
-----   Message from Brendolyn Patty, MD sent at 03/28/2021  9:18 AM EDT ----- 1. Skin , right ant axilla sup MELANOCYTIC NEVUS, INTRADERMAL TYPE, IRRITATED 2. Skin , right ant axilla inferior MELANOCYTIC NEVUS, COMPOUND LENTIGINOUS TYPE, IRRITATED 3. Skin , right upper medial thigh MELANOCYTIC NEVUS, INTRADERMAL TYPE, IRRITATED  1,2,3.  All irritated benign moles - please call patient

## 2021-03-28 NOTE — Telephone Encounter (Signed)
Left message advising patient all 3 biopsies were benign nevi. No further treatment needed.

## 2021-04-27 ENCOUNTER — Ambulatory Visit: Payer: Managed Care, Other (non HMO) | Admitting: Dermatology

## 2021-05-03 ENCOUNTER — Ambulatory Visit: Payer: Managed Care, Other (non HMO) | Admitting: Dermatology

## 2021-05-03 ENCOUNTER — Other Ambulatory Visit: Payer: Self-pay

## 2021-05-03 DIAGNOSIS — L309 Dermatitis, unspecified: Secondary | ICD-10-CM

## 2021-05-03 MED ORDER — BETAMETHASONE DIPROPIONATE 0.05 % EX CREA: TOPICAL_CREAM | CUTANEOUS | 1 refills | Status: AC

## 2021-05-03 MED ORDER — CLOBETASOL PROPIONATE 0.05 % EX SOLN
CUTANEOUS | 1 refills | Status: AC
Start: 2021-05-03 — End: ?

## 2021-05-03 MED ORDER — HYDROXYZINE HCL 25 MG PO TABS: ORAL_TABLET | ORAL | 0 refills | Status: AC

## 2021-05-03 NOTE — Progress Notes (Signed)
   Follow-Up Visit   Subjective  Angela [name] is a 25 y.o. female who presents for the following: Follow-up (Patient here today concerning a itchy warm rash that started yesterday morning at arms legs, abdomen, back. Patient denies any new meds, detergents, soaps, perfumes, and reports no pets. Patient uses dye and fragrance free products in home. Patient is currently taking zyquil at night and zyrtec daily with no relief. ).  Pt has seasonal allergies    The following portions of the chart were reviewed this encounter and updated as appropriate:      Review of Systems: No other skin or systemic complaints except as noted in HPI or Assessment and Plan.   Objective  Well appearing patient in no apparent distress; mood and affect are within normal limits.  A focused examination was performed including arms, shoulder, back, chest, abdomen, . Relevant physical exam findings are noted in the Assessment and Plan.  arms, back, shoulders, chest pink scaly papules on arms, shoulders, back   Assessment & Plan  Dermatitis arms, back, shoulders, chest  Unclear etiology, possible atopic  Recommend mild soap and moisturizing cream 1-2 times daily.  Gentle skin care handout provided.   Stop dial soap, continue Dove soap   Start Eczema Skin Care  Buy TWO 16oz jars of CeraVe moisturizing cream  [facility], [facility], [facility] (no prescription needed)  Costs about $15 per jar   Jar #1: Use as a moisturizer as needed. Can be applied to any area of the body. Use twice daily to unaffected areas.  Jar #2: Pour one 48ml bottle of clobetasol 0.05% solution into jar, mix well. Label this jar to indicate the medication has been added. Use twice daily to affected areas. Do not apply to face, groin or underarms.  Moisturizer may burn or sting initially. Try for at least 4 weeks.   Start Hydroxyzine 25 mg 1-2 tabs PO qhs prn itch Start Diprolene cream to more severely affected areas qd/bid avoid  f/g/a Will consider prednisone taper if not improving.   Topical steroids (such as triamcinolone, fluocinolone, fluocinonide, mometasone, clobetasol, halobetasol, betamethasone, hydrocortisone) can cause thinning and lightening of the skin if they are used for too long in the same area. Your physician has selected the right strength medicine for your problem and area affected on the body. Please use your medication only as directed by your physician to prevent side effects.    clobetasol (TEMOVATE) 0.05 % external solution - arms, back, shoulders, chest Pour one 76ml bottle of clobetasol 0.05% solution into jar, mix well. Label this jar to indicate the medication has been added. Use twice daily to affected areas. Do not apply to face, groin or underarms  betamethasone dipropionate 0.05 % cream - arms, back, shoulders, chest Apply topically to spot treated affected areas of body that are itchy twice daily until clear. Avoid applying to face, groin, and axilla. Use as directed.  hydrOXYzine (ATARAX) 25 MG tablet - arms, back, shoulders, chest Take 1 tablet by mouth 1 - 2 times daily as needed for itch. May cause drowsiness.  Return for 1 month follow up rash / irritated mole removal . I, Ruthell Rummage, CMA, am acting as scribe for Brendolyn Patty, MD.  Documentation: I have reviewed the above documentation for accuracy and completeness, and I agree with the above.  Brendolyn Patty MD

## 2021-05-03 NOTE — Patient Instructions (Addendum)
Eczema Skin Care  Buy TWO 16oz jars of CeraVe moisturizing cream  [facility], [facility], [facility] (no prescription needed)  Costs about $15 per jar   Jar #1: Use as a moisturizer as needed. Can be applied to any area of the body. Use twice daily to unaffected areas.  Jar #2: Pour one 80ml bottle of clobetasol 0.05% solution into jar, mix well. Label this jar to indicate the medication has been added. Use twice daily to affected areas. Do not apply to face, groin or underarms.  Moisturizer may burn or sting initially. Try for at least 4 weeks.   Recommend OTC Gold Bond Rapid Relief Anti-Itch cream (pramoxine + menthol), CeraVe Anti-itch cream or lotion (pramoxine), Sarna lotion (Original- menthol + camphor or Sensitive- pramoxine) or Eucerin 12 hour Itch Relief lotion (menthol) up to 3 times per day to areas on body that are itchy.  Topical steroids (such as triamcinolone, fluocinolone, fluocinonide, mometasone, clobetasol, halobetasol, betamethasone, hydrocortisone) can cause thinning and lightening of the skin if they are used for too long in the same area. Your physician has selected the right strength medicine for your problem and area affected on the body. Please use your medication only as directed by your physician to prevent side effects.   Avoid applying to face, groin, and axilla. Use as directed. Long-term use can cause thinning of the skin.    If You Need Anything After Your Visit  If you have any questions or concerns for your doctor, please call our main line at [phone] and press option 4 to reach your doctor's medical assistant. If no one answers, please leave a voicemail as directed and we will return your call as soon as possible. Messages left after 4 pm will be answered the following business day.   You may also send [place] a message via [place]. We typically respond to MyChart messages within 1-2 business days.  For prescription refills, please ask your pharmacy to contact our  office. Our fax number is [phone].  If you have an urgent issue when the clinic is closed that cannot wait until the next business day, you can page your doctor at the number below.    Please note that while we do our best to be available for urgent issues outside of office hours, we are not available 24/7.   If you have an urgent issue and are unable to reach [place], you may choose to seek medical care at your doctor's office, retail clinic, urgent care center, or emergency room.  If you have a medical emergency, please immediately call 911 or go to the emergency department.  Pager Numbers  - Dr. Nehemiah Massed: [phone]  - Dr. Laurence Ferrari: [phone]  - Dr. Nicole Kindred: [phone]  In the event of inclement weather, please call our main line at [phone] for an update on the status of any delays or closures.  Dermatology Medication Tips: Please keep the boxes that topical medications come in in order to help keep track of the instructions about where and how to use these. Pharmacies typically print the medication instructions only on the boxes and not directly on the medication tubes.   If your medication is too expensive, please contact our office at [phone] option 4 or send [place] a message through [place].   We are unable to tell what your co-pay for medications will be in advance as this is different depending on your insurance coverage. However, we may be able to find a substitute medication at lower cost or fill  out paperwork to get insurance to cover a needed medication.   If a prior authorization is required to get your medication covered by your insurance company, please allow [place] 1-2 business days to complete this process.  Drug prices often vary depending on where the prescription is filled and some pharmacies may offer cheaper prices.  The website www.goodrx.com contains coupons for medications through different pharmacies. The prices here do not account for what the cost may  be with help from insurance (it may be cheaper with your insurance), but the website can give you the price if you did not use any insurance.  - You can print the associated coupon and take it with your prescription to the pharmacy.  - You may also stop by our office during regular business hours and pick up a GoodRx coupon card.  - If you need your prescription sent electronically to a different pharmacy, notify our office through [facility] or by phone at [phone] option 4.     Si Usted Necesita Algo Despus de Su Visita  Tambin puede enviarnos un mensaje a travs de Pharmacist, community. Por lo general respondemos a los mensajes de MyChart en el transcurso de 1 a 2 das hbiles.  Para renovar recetas, por favor pida a su farmacia que se ponga en contacto con nuestra oficina. Harland Dingwall de fax es [place] [phone].  Si tiene un asunto urgente cuando la clnica est cerrada y que no puede esperar hasta el siguiente da hbil, puede llamar/localizar a su doctor(a) al nmero que aparece a continuacin.   Por favor, tenga en cuenta que aunque hacemos todo lo posible para estar disponibles para asuntos urgentes fuera del horario de [place], no estamos disponibles las 24 horas del da, los 7 das de la [place].   Si tiene un problema urgente y no puede comunicarse con nosotros, puede optar por buscar atencin mdica  en el consultorio de su doctor(a), en una clnica privada, en un centro de atencin urgente o en una sala de emergencias.  Si tiene Engineering geologist, por favor llame inmediatamente al 911 o vaya a la sala de emergencias.  Nmeros de bper  - Dr. Nehemiah Massed: [phone]  - Dra. Moye: [phone]  - Dra. Nicole Kindred: [phone]  En caso de inclemencias del [place], por favor llame a Johnsie Kindred principal al [phone] para una actualizacin sobre el [place] de cualquier retraso o cierre.  Consejos para la medicacin en dermatologa: Por favor, guarde las cajas en las  que vienen los medicamentos de uso tpico para ayudarle a seguir las instrucciones sobre dnde y cmo usarlos. Las farmacias generalmente imprimen las instrucciones del medicamento slo en las cajas y no directamente en los tubos del [place].   Si su medicamento es muy caro, por favor, pngase en contacto con Zigmund Daniel llamando al [phone] y presione la opcin 4 o envenos un mensaje a travs de Pharmacist, community.   No podemos decirle cul ser su copago por los medicamentos por adelantado ya que esto es diferente dependiendo de la cobertura de su seguro. Sin embargo, es posible que podamos encontrar un medicamento sustituto a Electrical engineer un formulario para que el seguro cubra el medicamento que se considera necesario.   Si se requiere una autorizacin previa para que su compaa de seguros [place] su medicamento, por favor permtanos de 1 a 2 das hbiles para completar este proceso.  Los precios de los medicamentos varan con frecuencia dependiendo del Environmental consultant de dnde se surte la receta y  alguna farmacias pueden ofrecer precios ms baratos.  El sitio web www.goodrx.com tiene cupones para medicamentos de Airline pilot. Los precios aqu no tienen en cuenta lo que podra costar con la ayuda del seguro (puede ser ms barato con su seguro), pero el sitio web puede darle el precio si no utiliz Research scientist (physical sciences).  - Puede imprimir el cupn correspondiente y llevarlo con su receta a la farmacia.  - Tambin puede pasar por nuestra oficina durante el horario de atencin regular y Charity fundraiser una tarjeta de cupones de GoodRx.  - Si necesita que su receta se enve electrnicamente a una farmacia diferente, informe a nuestra oficina a travs de MyChart de [place] o por telfono llamando al [phone] y presione la opcin 4.

## 2021-05-24 ENCOUNTER — Other Ambulatory Visit: Payer: Self-pay

## 2021-05-24 ENCOUNTER — Ambulatory Visit: Payer: Managed Care, Other (non HMO) | Admitting: Dermatology

## 2021-05-24 DIAGNOSIS — L309 Dermatitis, unspecified: Secondary | ICD-10-CM

## 2021-05-24 DIAGNOSIS — L719 Rosacea, unspecified: Secondary | ICD-10-CM | POA: Diagnosis not present

## 2021-05-24 DIAGNOSIS — D224 Melanocytic nevi of scalp and neck: Secondary | ICD-10-CM | POA: Diagnosis not present

## 2021-05-24 DIAGNOSIS — D485 Neoplasm of uncertain behavior of skin: Secondary | ICD-10-CM

## 2021-05-24 MED ORDER — DOXYCYCLINE 40 MG PO CPDR: 40.0000 mg | DELAYED_RELEASE_CAPSULE | Freq: Every day | ORAL | 3 refills | Status: DC

## 2021-05-24 MED ORDER — IVERMECTIN 1 % EX CREA: 1.0000 "application " | TOPICAL_CREAM | Freq: Every day | CUTANEOUS | 3 refills | Status: AC

## 2021-05-24 NOTE — Patient Instructions (Signed)

## 2021-05-24 NOTE — Progress Notes (Signed)
Follow-Up Visit   Subjective  Angela [name] is a 25 y.o. female who presents for the following: Nevus (Irritated moles of vertex scalp and right postauricular - Shave removal today) and Follow-up (Eczema of arms, back, shoulders, chest - resolved ).  H/o rosacea. Face is flared, has used clindamycin lotion, not helping.  Tried metronidazole and Finacea in past didn't help/irritating. She has had laser treatments in the past.  Sunscreens irritate her skin so doesn't use.    The following portions of the chart were reviewed this encounter and updated as appropriate:       Review of Systems:  No other skin or systemic complaints except as noted in HPI or Assessment and Plan.  Objective  Well appearing patient in no apparent distress; mood and affect are within normal limits.  A focused examination was performed including scalp, face, trunk. Relevant physical exam findings are noted in the Assessment and Plan.  Vertex scalp 5 mm flesh papule  Right postauricular scalp 8 mm brown papule  Right supra auricular scalp 7 mm flesh papule  Head - Anterior (Face) Erythema of malar cheeks and small pink papules of forehead, cheeks  Mid Back, arms, shoulders, chest Clear today    Assessment & Plan  Neoplasm of uncertain behavior of skin (3) Vertex scalp  Epidermal / dermal shaving  Lesion diameter (cm):  0.5 Informed consent: discussed and consent obtained   Timeout: patient name, date of birth, surgical site, and procedure verified   Procedure prep:  Patient was prepped and draped in usual sterile fashion Prep type:  Isopropyl alcohol Anesthesia: the lesion was anesthetized in a standard fashion   Anesthetic:  1% lidocaine w/ epinephrine 1-100,000 buffered w/ 8.4% NaHCO3 Instrument used: flexible razor blade   Hemostasis achieved with: pressure, aluminum chloride and electrodesiccation   Outcome: patient tolerated procedure well   Post-procedure details: sterile dressing  applied and wound care instructions given   Dressing type: bandage and petrolatum    Specimen 1 - Surgical pathology Differential Diagnosis: Irritated nevus R/O dysplasia Check Margins: No  Right postauricular scalp  Epidermal / dermal shaving  Lesion diameter (cm):  0.8 Informed consent: discussed and consent obtained   Timeout: patient name, date of birth, surgical site, and procedure verified   Procedure prep:  Patient was prepped and draped in usual sterile fashion Prep type:  Isopropyl alcohol Anesthesia: the lesion was anesthetized in a standard fashion   Anesthetic:  1% lidocaine w/ epinephrine 1-100,000 buffered w/ 8.4% NaHCO3 Instrument used: flexible razor blade   Hemostasis achieved with: pressure, aluminum chloride and electrodesiccation   Outcome: patient tolerated procedure well   Post-procedure details: sterile dressing applied and wound care instructions given   Dressing type: bandage and petrolatum    Specimen 2 - Surgical pathology Differential Diagnosis: Irritated nevus R/O dysplasia Check Margins: No  Right supra auricular scalp  Epidermal / dermal shaving  Lesion diameter (cm):  0.7 Informed consent: discussed and consent obtained   Timeout: patient name, date of birth, surgical site, and procedure verified   Procedure prep:  Patient was prepped and draped in usual sterile fashion Prep type:  Isopropyl alcohol Anesthesia: the lesion was anesthetized in a standard fashion   Anesthetic:  1% lidocaine w/ epinephrine 1-100,000 buffered w/ 8.4% NaHCO3 Instrument used: flexible razor blade   Hemostasis achieved with: pressure, aluminum chloride and electrodesiccation   Outcome: patient tolerated procedure well   Post-procedure details: sterile dressing applied and wound care instructions given  Dressing type: bandage and petrolatum    Specimen 3 - Surgical pathology Differential Diagnosis: Irritated nevus R/O dysplasia Check Margins: No  Rosacea Head  - Anterior (Face)  With flare Start Soolantra cream qd - No history of irritation with Cetaphil cream (vehicle) Doxycycline (Oracea) 40 mg ER 1 po qd with food and plenty of fluid Doxycycline should be taken with food to prevent nausea. Do not lay down for 30 minutes after taking. Be cautious with sun exposure and use good sun protection while on this medication. Pregnant women should not take this medication.    Rosacea is a chronic progressive skin condition usually affecting the face of adults, causing redness and/or acne bumps. It is treatable but not curable. It sometimes affects the eyes (ocular rosacea) as well. It may respond to topical and/or systemic medication and can flare with stress, sun exposure, alcohol, exercise and some foods.  Daily application of broad spectrum spf 30+ sunscreen to face is recommended to reduce flares.   doxycycline (ORACEA) 40 MG capsule - Head - Anterior (Face) Take 1 capsule (40 mg total) by mouth daily. With food and plenty of fluid  Ivermectin (SOOLANTRA) 1 % CREA - Head - Anterior (Face) Apply 1 application topically daily.  Eczema, unspecified type Mid Back, arms, shoulders, chest  Improved with topical Clob/CeraVe mix, can d/c and use prn flares  Recommend mild soap and CeraVe moisturizing cream 1-2 times daily.  Gentle skin care handout provided.    Return in about 3 months (around 08/22/2021) for Rosacea.  I, Ashok Cordia, CMA, am acting as scribe for Brendolyn Patty, MD .  Documentation: I have reviewed the above documentation for accuracy and completeness, and I agree with the above.  Brendolyn Patty MD

## 2021-05-26 ENCOUNTER — Telehealth: Payer: Self-pay

## 2021-05-26 NOTE — Telephone Encounter (Signed)
LM on VM please return my call to discuss biopsy results  

## 2021-05-26 NOTE — Telephone Encounter (Signed)
Spoke with pt and informed her of results. She had no concerns.

## 2021-05-26 NOTE — Telephone Encounter (Signed)
-----   Message from Brendolyn Patty, MD sent at 05/25/2021  5:36 PM EST ----- 1. 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 Skin , right postauricular scalp MELANOCYTIC NEVUS, INTRADERMAL TYPE, IRRITATED 3. Skin , right supra auricular scalp MELANOCYTIC NEVUS, INTRADERMAL TYPE, IRRITATED  1. Mildly atypical mole, observe 2. Benign irritated mole 3. Benign irritated mole   - please call pt

## 2021-06-01 ENCOUNTER — Telehealth: Payer: Self-pay

## 2021-06-01 DIAGNOSIS — L719 Rosacea, unspecified: Secondary | ICD-10-CM

## 2021-06-01 NOTE — Telephone Encounter (Signed)
Doxycycline 40 mg denied by insurance. Pt must have tried and failed doxycycline monohydrate 50 mg and minocycline 55 mg extended release. Please advise.

## 2021-06-06 MED ORDER — DOXYCYCLINE MONOHYDRATE 50 MG PO CAPS: 50.0000 mg | ORAL_CAPSULE | Freq: Every day | ORAL | 2 refills | Status: AC

## 2021-06-06 NOTE — Telephone Encounter (Signed)
Spoke with pt and informed her of dosage change. She had no concerns. Rx sent to pharmacy.

## 2021-06-06 NOTE — Addendum Note (Signed)
Addended by: Harriett Sine on: 06/06/2021 04:27 PM   Modules accepted: Orders

## 2021-06-07 ENCOUNTER — Ambulatory Visit: Payer: Managed Care, Other (non HMO) | Admitting: Dermatology

## 2021-08-30 ENCOUNTER — Ambulatory Visit: Payer: Managed Care, Other (non HMO) | Admitting: Dermatology

## 2022-09-27 ENCOUNTER — Other Ambulatory Visit (HOSPITAL_BASED_OUTPATIENT_CLINIC_OR_DEPARTMENT_OTHER): Payer: Self-pay

## 2022-09-27 MED ORDER — WEGOVY 0.25 MG/0.5ML ~~LOC~~ SOAJ
0.2500 mg | SUBCUTANEOUS | 0 refills | Status: AC
  Filled 2022-09-27: qty 2, 28d supply, fill #0

## 2023-04-18 ENCOUNTER — Other Ambulatory Visit: Payer: Self-pay

## 2023-04-18 ENCOUNTER — Emergency Department (HOSPITAL_BASED_OUTPATIENT_CLINIC_OR_DEPARTMENT_OTHER)
Admission: EM | Admit: 2023-04-18 | Discharge: 2023-04-18 | Disposition: A | Discharge status: Home / Self Care | Payer: 59 | Attending: Emergency Medicine | Admitting: Emergency Medicine

## 2023-04-18 DIAGNOSIS — D72829 Elevated white blood cell count, unspecified: Secondary | ICD-10-CM | POA: Diagnosis not present

## 2023-04-18 DIAGNOSIS — J45909 Unspecified asthma, uncomplicated: Secondary | ICD-10-CM | POA: Diagnosis not present

## 2023-04-18 DIAGNOSIS — R55 Syncope and collapse: Secondary | ICD-10-CM | POA: Diagnosis present

## 2023-04-18 LAB — CBC WITH DIFFERENTIAL/PLATELET
Abs Immature Granulocytes: 0.06 10*3/uL (ref 0.00–0.07)
Basophils Absolute: 0.1 10*3/uL (ref 0.0–0.1)
Basophils Relative: 1 %
Eosinophils Absolute: 0.1 10*3/uL (ref 0.0–0.5)
Eosinophils Relative: 1 %
HCT: 46.2 % — ABNORMAL HIGH (ref 36.0–46.0)
Hemoglobin: 15.5 g/dL — ABNORMAL HIGH (ref 12.0–15.0)
Immature Granulocytes: 1 %
Lymphocytes Relative: 18 %
Lymphs Abs: 2.2 10*3/uL (ref 0.7–4.0)
MCH: 30.1 pg (ref 26.0–34.0)
MCHC: 33.5 g/dL (ref 30.0–36.0)
MCV: 89.7 fL (ref 80.0–100.0)
Monocytes Absolute: 0.5 10*3/uL (ref 0.1–1.0)
Monocytes Relative: 4 %
Neutro Abs: 9.7 10*3/uL — ABNORMAL HIGH (ref 1.7–7.7)
Neutrophils Relative %: 75 %
Platelets: 295 10*3/uL (ref 150–400)
RBC: 5.15 MIL/uL — ABNORMAL HIGH (ref 3.87–5.11)
RDW: 12.9 % (ref 11.5–15.5)
WBC: 12.5 10*3/uL — ABNORMAL HIGH (ref 4.0–10.5)
nRBC: 0 % (ref 0.0–0.2)

## 2023-04-18 LAB — BASIC METABOLIC PANEL
Anion gap: 7 (ref 5–15)
BUN: 12 mg/dL (ref 6–20)
CO2: 24 mmol/L (ref 22–32)
Calcium: 8.2 mg/dL — ABNORMAL LOW (ref 8.9–10.3)
Chloride: 107 mmol/L (ref 98–111)
Creatinine, Ser: 0.69 mg/dL (ref 0.44–1.00)
GFR, Estimated: >60 mL/min (ref >60)
Glucose, Bld: 97 mg/dL (ref 70–99)
Potassium: 4.5 mmol/L (ref 3.5–5.1)
Sodium: 138 mmol/L (ref 135–145)

## 2023-04-18 LAB — PREGNANCY, URINE: Preg Test, Ur: NEGATIVE

## 2023-04-18 MED ORDER — SODIUM CHLORIDE 0.9 % IV BOLUS
1000.0000 mL | Freq: Once | INTRAVENOUS | Status: AC
  Administered 2023-04-18: 1000 mL via INTRAVENOUS

## 2023-04-18 NOTE — Discharge Instructions (Addendum)
Thank you for allowing [place] to be a part of your care today.  Your workup is overall reassuring, but does suggest dehydration/low volume.  This is likely secondary to donating plasma and not receiving fluids.  You were given 1L IV fluids while in the ED.  Continue to increase your water and other fluid intake over the next few days.   Return to the ED if you develop sudden worsening of your symptoms or if you have new concerns.

## 2023-04-18 NOTE — ED Triage Notes (Signed)
Pt arrived via [facility] from store after having two syncopal events witnessed by her mother. Per EMS, pt had no trauma or fall when syncope x2 occurred. EMS further reports throughout transport pt would c/o nausea and would become bradycardic or hypotensive.   EMS VS BP 90/64 HR 55 SpO2 97% RA CBG 134  200 mL NS admin via EMS

## 2023-04-18 NOTE — ED Provider Notes (Signed)
[place] [facility] AT [facility] Provider Note   CSN: [id number] Arrival date & time: 04/18/23  1845     History  Chief Complaint  Patient presents with   Loss of Consciousness    Angela [name] is a 27 y.o. female with past medical history significant for GERD, ADHD, migraines, pulmonary artery stenosis, asthma, anxiety, depression presents to the ED via EMS due to two syncopal episodes.  Patient was shopping with her mother when she passed out.  She reports feeling lightheaded, cold sweats, and nauseated before the syncope.  When she tried to stand up from sitting is when she had her second episode.  Patient did not hit her head or sustain injury due to syncopal episodes.  Of note, patient donated plasma today.  Patient's mother reports due to the fluid shortage, the plasma donation center does not give fluids at time of donation and instead is having patients drink a bottle of Gatorade.  Denies chest pain, shortness of breath, fever, abdominal pain, vomiting, diarrhea, dizziness, headache.         Home Medications Prior to Admission medications   Medication Sig Start Date End Date Taking? Authorizing Provider  betamethasone dipropionate 0.05 % cream Apply topically to spot treated affected areas of body that are itchy twice daily until clear. Avoid applying to face, groin, and axilla. Use as directed. 05/03/21   Willeen Niece, MD  cetirizine (ZYRTEC) 10 MG tablet Take 10 mg by mouth at bedtime.    [provider]  clobetasol (TEMOVATE) 0.05 % external solution Pour one 50ml bottle of clobetasol 0.05% solution into jar, mix well. Label this jar to indicate the medication has been added. Use twice daily to affected areas. Do not apply to face, groin or underarms 05/03/21   Willeen Niece, MD  clonazePAM (KLONOPIN) 0.25 MG disintegrating tablet 0.25 mg 2 (two) times daily. 06/23/20   [provider]  doxycycline (MONODOX) 50 MG capsule Take 1 capsule  (50 mg total) by mouth daily. 06/06/21   Willeen Niece, MD  EPINEPHrine (EPIPEN 2-PAK) 0.3 mg/0.3 mL IJ SOAJ injection Inject 0.3 mLs (0.3 mg total) into the muscle as needed. Patient taking differently: Inject 0.3 mg into the muscle as needed (severe allergic reaction). 06/01/17   Marcelyn Bruins, MD  Fremanezumab-vfrm 225 MG/1.5ML SOSY Inject 225 mg into the skin every 30 (thirty) days.     [provider]  frovatriptan (FROVA) 2.5 MG tablet Take one at onset of severe headache; may repeat once in 2 hours if headache persists; max of 2 in 24 hours or 4/week. 07/02/18   [provider]  guanFACINE (INTUNIV) 2 MG TB24 ER tablet Take 1 tablet by mouth daily. 03/10/20   [provider]  hydrOXYzine (ATARAX) 25 MG tablet Take 1 tablet by mouth 1 - 2 times daily as needed for itch. May cause drowsiness. 05/03/21   Willeen Niece, MD  Ivermectin (SOOLANTRA) 1 % CREA Apply 1 application topically daily. 05/24/21   Willeen Niece, MD  ketorolac (TORADOL) 30 MG/ML injection Inject 30mg  (1ml) once every 12 hours as needed for severe headache; no other NSAIDs 01/14/19   [provider]  metoCLOPramide (REGLAN) 5 MG tablet metoclopramide 5 mg tablet    [provider]  ondansetron (ZOFRAN) 4 MG tablet Take 1 tablet (4 mg total) by mouth every 8 (eight) hours as needed for nausea. 10/07/20   Pasty Spillers, MD  pantoprazole (PROTONIX) 40 MG tablet Take 1 tablet (40 mg  total) by mouth daily. 10/07/20   Pasty Spillers, MD  propranolol (INDERAL) 10 MG tablet Take one tab as needed for panic attacks 05/12/19   Arfeen, Phillips Grout, MD  Semaglutide-Weight Management (WEGOVY) 0.25 MG/0.5ML SOAJ Inject 0.25 mg into the skin once a week. 09/27/22     tiZANidine (ZANAFLEX) 4 MG tablet Take 4 mg by mouth every 6 (six) hours as needed for muscle spasms.    [provider]  Vitamin D, Ergocalciferol, (DRISDOL) 1.25 MG (50000 UT) CAPS capsule TK 1 C PO ONCE A WEEK FOR 12  DOSES 01/14/19   [provider]      Allergies    Cyanoacrylate, Clavulanic acid, Compazine [prochlorperazine edisylate], Imipramine, Ketamine, Prednisone, Topamax [topiramate], Haldol [haloperidol], Haldol [haloperidol], Adhesive [tape], Flagyl [metronidazole], Gold, and Other    Review of Systems   Review of Systems  Constitutional:  Positive for diaphoresis. Negative for fever.  Respiratory:  Negative for shortness of breath.   Cardiovascular:  Negative for chest pain.  Gastrointestinal:  Positive for nausea. Negative for abdominal pain and vomiting.  Neurological:  Positive for syncope and light-headedness. Negative for dizziness and headaches.    Physical Exam Updated Vital Signs BP 104/75 (BP Location: Right Arm)   Pulse 70   Temp 97.9 F (36.6 C) (Oral)   Resp 16   SpO2 100%  Physical Exam Vitals and nursing note reviewed.  Constitutional:      General: She is not in acute distress.    Appearance: Normal appearance. She is not ill-appearing or diaphoretic.  HENT:     Mouth/Throat:     Lips: Pink.     Mouth: Mucous membranes are moist.  Cardiovascular:     Rate and Rhythm: Normal rate and regular rhythm.     Heart sounds: Normal heart sounds.  Pulmonary:     Effort: Pulmonary effort is normal.  Abdominal:     General: Abdomen is flat.     Palpations: Abdomen is soft.     Tenderness: There is no abdominal tenderness.  Skin:    General: Skin is warm and dry.     Capillary Refill: Capillary refill takes less than 2 seconds.     Coloration: Skin is not pale.  Neurological:     Mental Status: She is alert and oriented to person, place, and time. Mental status is at baseline.     GCS: GCS eye subscore is 4. GCS verbal subscore is 5. GCS motor subscore is 6.     Comments: Patient moving all extremities appropriately.  No focal weakness.    Psychiatric:        Mood and Affect: Mood normal.        Behavior: Behavior normal.     ED Results / Procedures /  Treatments   Labs (all labs ordered are listed, but only abnormal results are displayed) Labs Reviewed  BASIC METABOLIC PANEL - Abnormal; Notable for the following components:      Result Value   Calcium 8.2 (*)    All other components within normal limits  CBC WITH DIFFERENTIAL/PLATELET - Abnormal; Notable for the following components:   WBC 12.5 (*)    RBC 5.15 (*)    Hemoglobin 15.5 (*)    HCT 46.2 (*)    Neutro Abs 9.7 (*)    All other components within normal limits  PREGNANCY, URINE  CBG MONITORING, ED    EKG None  Radiology No results found.  Procedures Procedures    Medications Ordered  in ED Medications  sodium chloride 0.9 % bolus 1,000 mL (0 mLs Intravenous Stopped 04/18/23 2133)    ED Course/ Medical Decision Making/ A&P                                 Medical Decision Making Amount and/or Complexity of Data Reviewed Labs: ordered. ECG/medicine tests: ordered.   This patient presents to the ED with chief complaint(s) of syncope with pertinent past medical history of pulmonary artery stenosis.  The complaint involves an extensive differential diagnosis and also carries with it a high risk of complications and morbidity.    The differential diagnosis includes hypovolemia, orthostatic syncope, vasovagal syncope, metabolic derangement   The initial plan is to obtain ECG, labs  Additional history obtained: Additional history obtained from family; patient's mother at bedside  Initial Assessment:   On exam, patient is resting comfortably in bed and does not appear to be in acute distress.  Blood pressure is soft at 96-100 systolic.  Heart rate is normal around 70 with regular rhythm.  Sinus rhythm on the monitor.  Lungs clear to auscultation bilaterally.  Patient is moving all extremities appropriately.  Skin is warm and dry.  She is not pale.    Independent ECG/labs interpretation:  The following labs were independently interpreted:  CBC with elevated  WBC and RBC, likely concentrated.  Metabolic panel with mild hypocalcemia, but otherwise no major electrolyte disturbance.  Renal function is normal.   ECG demonstrates sinus rhythm.   Treatment and Reassessment: After initial assessment, patient requesting water and graham crackers.  She tolerated both without difficulty.    Orthostatic VS are negative.   Disposition:   Suspect patient's symptoms related to donating plasma and not receiving fluids back resulting in orthostatic hypotension and syncope.  Patient feels improved following fluids and vital signs are stable.  Advised patient to increase her fluids over the next few days.    The patient has been appropriately medically screened and/or stabilized in the ED. I have low suspicion for any other emergent medical condition which would require further screening, evaluation or treatment in the ED or require inpatient management. At time of discharge the patient is hemodynamically stable and in no acute distress. I have discussed work-up results and diagnosis with patient and answered all questions. Patient is agreeable with discharge plan. We discussed strict return precautions for returning to the emergency department and they verbalized understanding.           Final Clinical Impression(s) / ED Diagnoses Final diagnoses:  Syncope, unspecified syncope type    Rx / DC Orders ED Discharge Orders     None         Lenard Simmer, PA-C 04/18/23 2200    Gwyneth Sprout, MD 04/23/23 1159

## 2023-04-18 NOTE — ED Notes (Addendum)
Patient presents to ED after syncopal episode x2, first episode around 1700 today while walking in [employer]. Patient's mother reports that patient was "out for a few minutes and seemed like she couldn't protect her airway". Patient's mother reports prior to syncopal episode, patient c/o nausea. Patient reports giving plasma today at 1400, which is something she has done in the past. However, patient did not have IVF after giving plasma like she usually does.

## 2023-04-18 NOTE — ED Notes (Signed)
Attempted to draw blood from IV without blood return. IV still patent and flushes without difficulty. Attempted to find another vein for blood draw without success. Will ask another staff member to attempt to draw blood.
